# Patient Record
Sex: Male | Born: 1935 | Race: White | Hispanic: No | Marital: Married | Smoking: Never smoker
Health system: Southern US, Community
[De-identification: ages and names within clinical notes are randomized; demographics above are authoritative.]

## PROBLEM LIST (undated history)

## (undated) DIAGNOSIS — I350 Nonrheumatic aortic (valve) stenosis: Secondary | ICD-10-CM

## (undated) DIAGNOSIS — N183 Chronic kidney disease, stage 3 unspecified: Secondary | ICD-10-CM

## (undated) DIAGNOSIS — I10 Essential (primary) hypertension: Secondary | ICD-10-CM

## (undated) DIAGNOSIS — I219 Acute myocardial infarction, unspecified: Secondary | ICD-10-CM

## (undated) DIAGNOSIS — Z952 Presence of prosthetic heart valve: Secondary | ICD-10-CM

## (undated) DIAGNOSIS — M1990 Unspecified osteoarthritis, unspecified site: Secondary | ICD-10-CM

## (undated) DIAGNOSIS — E782 Mixed hyperlipidemia: Secondary | ICD-10-CM

## (undated) DIAGNOSIS — I480 Paroxysmal atrial fibrillation: Secondary | ICD-10-CM

## (undated) DIAGNOSIS — I2510 Atherosclerotic heart disease of native coronary artery without angina pectoris: Secondary | ICD-10-CM

## (undated) HISTORY — DX: Unspecified osteoarthritis, unspecified site: M19.90

## (undated) HISTORY — DX: Acute myocardial infarction, unspecified: I21.9

## (undated) HISTORY — PX: CORONARY ARTERY BYPASS GRAFT: SHX141

## (undated) HISTORY — DX: Chronic kidney disease, stage 3 unspecified: N18.30

## (undated) HISTORY — DX: Nonrheumatic aortic (valve) stenosis: I35.0

## (undated) HISTORY — PX: APPENDECTOMY: SHX54

## (undated) HISTORY — DX: Chronic kidney disease, stage 3 (moderate): N18.3

## (undated) HISTORY — DX: Essential (primary) hypertension: I10

## (undated) HISTORY — DX: Paroxysmal atrial fibrillation: I48.0

## (undated) HISTORY — PX: EYE SURGERY: SHX253

## (undated) HISTORY — DX: Atherosclerotic heart disease of native coronary artery without angina pectoris: I25.10

## (undated) HISTORY — PX: UMBILICAL HERNIA REPAIR: SHX196

## (undated) HISTORY — DX: Mixed hyperlipidemia: E78.2

---

## 2002-07-02 HISTORY — PX: OTHER SURGICAL HISTORY: SHX169

## 2003-05-07 ENCOUNTER — Inpatient Hospital Stay (HOSPITAL_COMMUNITY): Admission: EM | Admit: 2003-05-07 | Discharge: 2003-05-17 | Payer: Self-pay | Admitting: Cardiology

## 2004-07-24 ENCOUNTER — Ambulatory Visit: Payer: Self-pay | Admitting: Cardiology

## 2005-04-10 ENCOUNTER — Ambulatory Visit: Payer: Self-pay | Admitting: Cardiology

## 2005-04-12 ENCOUNTER — Ambulatory Visit: Payer: Self-pay | Admitting: Cardiology

## 2006-04-29 ENCOUNTER — Ambulatory Visit: Payer: Self-pay | Admitting: Cardiology

## 2007-05-08 ENCOUNTER — Ambulatory Visit: Payer: Self-pay | Admitting: Cardiology

## 2007-05-22 ENCOUNTER — Encounter: Payer: Self-pay | Admitting: Cardiology

## 2008-01-02 ENCOUNTER — Encounter: Payer: Self-pay | Admitting: Cardiology

## 2008-04-05 ENCOUNTER — Ambulatory Visit: Payer: Self-pay | Admitting: Cardiology

## 2009-03-01 DIAGNOSIS — E785 Hyperlipidemia, unspecified: Secondary | ICD-10-CM | POA: Insufficient documentation

## 2009-03-10 ENCOUNTER — Ambulatory Visit: Payer: Self-pay | Admitting: Cardiology

## 2009-03-10 DIAGNOSIS — R011 Cardiac murmur, unspecified: Secondary | ICD-10-CM | POA: Insufficient documentation

## 2009-03-10 DIAGNOSIS — N184 Chronic kidney disease, stage 4 (severe): Secondary | ICD-10-CM

## 2009-03-10 DIAGNOSIS — I2510 Atherosclerotic heart disease of native coronary artery without angina pectoris: Secondary | ICD-10-CM | POA: Insufficient documentation

## 2009-03-10 DIAGNOSIS — I10 Essential (primary) hypertension: Secondary | ICD-10-CM | POA: Insufficient documentation

## 2009-03-14 ENCOUNTER — Encounter: Payer: Self-pay | Admitting: Cardiology

## 2009-03-24 ENCOUNTER — Ambulatory Visit: Payer: Self-pay | Admitting: Cardiology

## 2009-03-25 ENCOUNTER — Encounter: Payer: Self-pay | Admitting: Cardiology

## 2009-04-21 ENCOUNTER — Ambulatory Visit: Payer: Self-pay | Admitting: Cardiology

## 2009-08-18 ENCOUNTER — Ambulatory Visit: Payer: Self-pay | Admitting: Cardiology

## 2009-08-18 DIAGNOSIS — I350 Nonrheumatic aortic (valve) stenosis: Secondary | ICD-10-CM

## 2009-08-22 ENCOUNTER — Encounter: Payer: Self-pay | Admitting: Cardiology

## 2009-08-25 ENCOUNTER — Encounter (INDEPENDENT_AMBULATORY_CARE_PROVIDER_SITE_OTHER): Payer: Self-pay | Admitting: *Deleted

## 2009-12-26 ENCOUNTER — Encounter: Payer: Self-pay | Admitting: Cardiology

## 2010-01-30 ENCOUNTER — Ambulatory Visit: Payer: Self-pay | Admitting: Cardiology

## 2010-07-06 ENCOUNTER — Encounter: Payer: Self-pay | Admitting: Cardiology

## 2010-07-27 ENCOUNTER — Encounter: Payer: Self-pay | Admitting: Cardiology

## 2010-08-01 NOTE — Assessment & Plan Note (Signed)
Summary: 6 mo fu per aug reminder-srs   Visit Type:  Follow-up Primary Provider:  Dr. Lia Hopping   History of Present Illness: 77 year old male presents for followup. He is here with his wife. He describes only occasional "sharp" chest pains that are brief period no progression in duration or intensity since I last saw him. Only occasional nitroglycerin glycerin use.  Followup labs from June showed BUN 30, creatinine 2.3, sodium 135, potassium 4.2. Creatinine had increased from 1.7 back in February. I discussed this with him, and he remains comfortable following with Dr. Olena Leatherwood. Nephrology consultation might be considered at some point.  Lipid profile from February showed total cholesterol 123, HDL 28, LDL 73, triglycerides 110. AST and ALT were normal. We reviewed these as well.  Patient states he had a colonoscopy done back in June, with removal of a polyp.   Preventive Screening-Counseling & Management  Alcohol-Tobacco     Smoking Status: never  Current Medications (verified): 1)  Fish Oil Double Strength 1200 Mg Caps (Omega-3 Fatty Acids) .... Take 1 Tablet By Mouth Three Times A Day 2)  Lisinopril 40 Mg Tabs (Lisinopril) .... Take One Tablet By Mouth Daily 3)  Aspirin 81 Mg Tbec (Aspirin) .... Take One Tablet By Mouth Daily 4)  Simvastatin 40 Mg Tabs (Simvastatin) .... Take One Tablet By Mouth Daily At Bedtime 5)  Tylenol 325 Mg Tabs (Acetaminophen) .... As Needed 6)  Pepcid 20 Mg Tabs (Famotidine) .... As Needed 7)  Metoprolol Tartrate 50 Mg Tabs (Metoprolol Tartrate) .... Take One Tablet By Mouth Twice A Day 8)  Isosorbide Mononitrate Cr 30 Mg Xr24h-Tab (Isosorbide Mononitrate) .... Take 1/2 Tablet By Mouth Daily  Allergies (verified): No Known Drug Allergies  Comments:  Nurse/Medical Assistant: The patient's medication list and allergies were reviewed with the patient and were updated in the Medication and Allergy Lists.  Past History:  Past Surgical  History: Last updated: 03/10/2009 Repair of umbilical hernia Appendectomy Knee Arthroplasty-Total (left) CABG - LIMA to LAD, SVG to OM, SVG to RCA, SVG to diagonal. November 2004.  Social History: Last updated: 03/10/2009 Tobacco Use - No.  Alcohol Use - no Married   Past Medical History: CAD - multivessel, LVEF 65% Hyperlipidemia Hypertension Myocardial Infarction Renal insufficiency Arthritis  Review of Systems       The patient complains of chest pain.  The patient denies anorexia, fever, syncope, dyspnea on exertion, peripheral edema, melena, and hematochezia.         Otherwise reviewed and negative except as outlined.  Vital Signs:  Patient profile:   77 year old male Height:      71 inches Weight:      190 pounds BMI:     26.60 Pulse rate:   49 / minute BP sitting:   143 / 78  (left arm) Cuff size:   regular  Vitals Entered By: Carlye Grippe (January 30, 2010 9:30 AM)  Nutrition Counseling: Patient's BMI is greater than 25 and therefore counseled on weight management options.   Physical Exam  Additional Exam:  Overweight male in no acute distress, somewhat hard of hearing. HEENT: Conjunctiva and lids normal, oropharynx clear. Neck: Supple, no elevated jugular venous pressure. No thyromegaly. Lungs: Clear without labored breathing. Cardiac: Regular rate and rhythm with 3/6 systolic murmur at the base. Radiates towards the carotids. No S3 gallop. Abdomen: Soft, nontender, no bruits. Bowel sounds present. Skin: Warm and dry. Extremities: No pitting edema. Distal pulses 2+. Musculoskeletal: No kyphosis. Neuropsychiatric: Alert oriented  x3. Affect normal.   Echocardiogram  Procedure date:  08/18/2009  Findings:      Mild to moderate LVH with LVEF greater than 65%, diastolic dysfunction, moderate left atrial enlargement, mild mitral regurgitation, mildly calcified aortic valve with mild aortic regurgitation and mild aortic stenosis, mean gradient 13 mm  mercury, mild tricuspid regurgitation, RVSP 41 mm mercury.  EKG  Procedure date:  01/30/2010  Findings:      Sinus bradycardia at 50 beats per minute.  Impression & Recommendations:  Problem # 1:  CORONARY ATHEROSCLEROSIS NATIVE CORONARY ARTERY (ICD-414.01)  Symptomatically stable on present medical regimen. Electrocardiogram is stable. Followup in 6 months, sooner if needed.  His updated medication list for this problem includes:    Lisinopril 40 Mg Tabs (Lisinopril) .Marland Kitchen... Take one tablet by mouth daily    Aspirin 81 Mg Tbec (Aspirin) .Marland Kitchen... Take one tablet by mouth daily    Metoprolol Tartrate 50 Mg Tabs (Metoprolol tartrate) .Marland Kitchen... Take one tablet by mouth twice a day    Isosorbide Mononitrate Cr 30 Mg Xr24h-tab (Isosorbide mononitrate) .Marland Kitchen... Take 1/2 tablet by mouth daily  Orders: EKG w/ Interpretation (93000)  Problem # 2:  AORTIC VALVE DISORDERS (ICD-424.1)  Mild aortic valve stenosis as noted by echocardiogram from February.  His updated medication list for this problem includes:    Lisinopril 40 Mg Tabs (Lisinopril) .Marland Kitchen... Take one tablet by mouth daily    Metoprolol Tartrate 50 Mg Tabs (Metoprolol tartrate) .Marland Kitchen... Take one tablet by mouth twice a day    Isosorbide Mononitrate Cr 30 Mg Xr24h-tab (Isosorbide mononitrate) .Marland Kitchen... Take 1/2 tablet by mouth daily  Problem # 3:  RENAL INSUFFICIENCY, CHRONIC (ICD-585.9)  Creatinine ranging from 1.7-2.3. Dr. Olena Leatherwood following at this point. I did discuss the possibility of nephrology referral at some time.  Problem # 4:  HYPERLIPIDEMIA-MIXED (ICD-272.4)  Well-controlled on present regimen. Followup fasting lipid profile and liver function tests around time of next visit.  His updated medication list for this problem includes:    Simvastatin 40 Mg Tabs (Simvastatin) .Marland Kitchen... Take one tablet by mouth daily at bedtime  Patient Instructions: 1)  Your physician wants you to follow-up in: 6 months. You will receive a reminder letter in  the mail one-two months in advance. If you don't receive a letter, please call our office to schedule the follow-up appointment. 2)  Your physician recommends that you go to the [facility] for a FASTING lipid profile and liver function labs:  IN 6 MONTHS BEFORE YOUR NEXT OFFICE VISIT.

## 2010-08-01 NOTE — Assessment & Plan Note (Signed)
Summary: 3 MONTH FU RECV LETTER VS   Visit Type:  Follow-up Primary Provider:  Dr. Lia Hopping   History of Present Illness: 77 year old male presents for follow-up. He reports no problems with progressive angina. He is having some "gas pain" in his lower chest, typically in the evenings, however not with exertion.  I reviewed his medications. He states that he has been taking all of his metoprolol tablets in the morning, rather than splitting them twice daily.  He has not had any followup labs with Dr. Olena Leatherwood, specifically relating to renal dysfunction and lipid status.  Preventive Screening-Counseling & Management  Alcohol-Tobacco     Smoking Status: never  Current Medications (verified): 1)  Fish Oil Double Strength 1200 Mg Caps (Omega-3 Fatty Acids) .... Take 1 Tablet By Mouth Three Times A Day 2)  Lisinopril 40 Mg Tabs (Lisinopril) .... Take One Tablet By Mouth Daily 3)  Aspirin 81 Mg Tbec (Aspirin) .... Take One Tablet By Mouth Daily 4)  Simvastatin 40 Mg Tabs (Simvastatin) .... Take One Tablet By Mouth Daily At Bedtime 5)  Tylenol 325 Mg Tabs (Acetaminophen) .... As Needed 6)  Pepcid 20 Mg Tabs (Famotidine) .... As Needed 7)  Metoprolol Tartrate 50 Mg Tabs (Metoprolol Tartrate) .... Take One Tablet By Mouth Twice A Day 8)  Isosorbide Mononitrate Cr 30 Mg Xr24h-Tab (Isosorbide Mononitrate) .... Take 1/2 Tablet By Mouth Daily  Allergies (verified): No Known Drug Allergies  Comments:  Nurse/Medical Assistant: The patient's medications and allergies were reviewed with the patient and were updated in the Medication and Allergy Lists. List reviewed.  Past History:  Past Medical History: Last updated: 03/10/2009 CAD Hyperlipidemia Hypertension Myocardial Infarction Renal insufficiency Arthritis  Past Surgical History: Last updated: 03/10/2009 Repair of umbilical hernia Appendectomy Knee Arthroplasty-Total (left) CABG - LIMA to LAD, SVG to OM, SVG to RCA, SVG to  diagonal. November 2004.  Social History: Last updated: 03/10/2009 Tobacco Use - No.  Alcohol Use - no Married    Review of Systems  The patient denies anorexia, fever, weight loss, hoarseness, syncope, peripheral edema, prolonged cough, headaches, melena, and hematochezia.         Otherwise reviewed and negative.  Vital Signs:  Patient profile:   77 year old male Height:      71 inches Weight:      193 pounds Pulse rate:   56 / minute BP sitting:   153 / 69  (left arm) Cuff size:   regular  Vitals Entered By: Carlye Grippe (August 18, 2009 2:15 PM)  Physical Exam  Additional Exam:  Overweight male in no acute distress, somewhat hard of hearing. HEENT: Conjunctiva and lids normal, oropharynx clear. Neck: Supple, no elevated jugular venous pressure. No thyromegaly. Lungs: Clear without labored breathing. Cardiac: Regular rate and rhythm with 3/6 systolic murmur at the base. Radiates towards the carotids. No S3 gallop. Abdomen: Soft, nontender, no bruits. Bowel sounds present. Skin: Warm and dry. Extremities: No pitting edema. Distal pulses 2+. Musculoskeletal: No kyphosis. Neuropsychiatric: Alert oriented x3. Affect normal.   Impression & Recommendations:  Problem # 1:  CORONARY ATHEROSCLEROSIS NATIVE CORONARY ARTERY (ICD-414.01)  Symptomatically stable on present medical regimen. I asked [name] to make sure that he takes his metoprolol on a twice-daily basis rather than all in the morning. Followup will be arranged in 6 months, sooner if he has progressive angina or breathlessness.  His updated medication list for this problem includes:    Lisinopril 40 Mg Tabs (Lisinopril) .Marland KitchenMarland KitchenMarland KitchenMarland Kitchen  Take one tablet by mouth daily    Aspirin 81 Mg Tbec (Aspirin) .Marland Kitchen... Take one tablet by mouth daily    Metoprolol Tartrate 50 Mg Tabs (Metoprolol tartrate) .Marland Kitchen... Take one tablet by mouth twice a day    Isosorbide Mononitrate Cr 30 Mg Xr24h-tab (Isosorbide mononitrate) .Marland Kitchen... Take 1/2  tablet by mouth daily  Problem # 2:  HYPERLIPIDEMIA-MIXED (ICD-272.4)  We plan followup lipid profile and liver function tests. He is tolerating simvastatin.  His updated medication list for this problem includes:    Simvastatin 40 Mg Tabs (Simvastatin) .Marland Kitchen... Take one tablet by mouth daily at bedtime  Orders: T-Basic Metabolic Panel [id number]) T-Hepatic Function [id number]) T-Lipid Profile [phone])  Problem # 3:  RENAL INSUFFICIENCY, CHRONIC (ICD-585.9)  Plan to obtain a followup BMET. Last creatinine was 2.1. [name] would be at risk for contrast nephropathy with a cardiac catheterization.  Orders: T-Basic Metabolic Panel [id number]) T-Hepatic Function [id number]) T-Lipid Profile [phone])  Problem # 4:  ESSENTIAL HYPERTENSION, BENIGN (ICD-401.1)  BP not optimally controlled today. Will continue with medication adjustments over her time.  His updated medication list for this problem includes:    Lisinopril 40 Mg Tabs (Lisinopril) .Marland Kitchen... Take one tablet by mouth daily    Aspirin 81 Mg Tbec (Aspirin) .Marland Kitchen... Take one tablet by mouth daily    Metoprolol Tartrate 50 Mg Tabs (Metoprolol tartrate) .Marland Kitchen... Take one tablet by mouth twice a day  Orders: T-Basic Metabolic Panel [id number]) T-Hepatic Function [phone]) T-Lipid Profile [id number])  Problem # 5:  AORTIC VALVE DISORDERS (ICD-424.1)  Mild aortic valve stenosis and regurgitation.  His updated medication list for this problem includes:    Lisinopril 40 Mg Tabs (Lisinopril) .Marland Kitchen... Take one tablet by mouth daily    Metoprolol Tartrate 50 Mg Tabs (Metoprolol tartrate) .Marland Kitchen... Take one tablet by mouth twice a day    Isosorbide Mononitrate Cr 30 Mg Xr24h-tab (Isosorbide mononitrate) .Marland Kitchen... Take 1/2 tablet by mouth daily  Patient Instructions: 1)  Labs - when convenient, nothing to eat or drink after midnight prior to labs 2)  Be sure to take Metoprolol two times a day  3)  Follow up in  6 months.

## 2010-08-01 NOTE — Letter (Signed)
Summary: Engineer, materials at [facility]  [address]   [place], [place] [address]   Phone: [phone]  Fax: [phone]        August 25, 2009 MRN: [id number]    Cortez Liuzzi [address] [place], [place]  [address]    Dear Mr. CAUL,  Your test ordered by Selena Batten has been reviewed by your physician (or physician assistant) and was found to be normal or stable. Your physician (or physician assistant) felt no changes were needed at this time.  ____ Echocardiogram  ____ Cardiac Stress Test  __X__ Lab Work-Per Dr. Diona Browner, Kidney function labs somewhat better (creatinine 1.7) and potassium normal.  LDL at goal and liver function labs OK.  Continue same medications for now.  ____ Peripheral vascular study of arms, legs or neck  ____ CT scan or X-ray  ____ Lung or Breathing test  ____ Other:   Thank you.   Cyril Loosen, RN, BSN    Duane Boston, M.D., F.A.C.C. Thressa Sheller, M.D., F.A.C.C. Oneal Grout, M.D., F.A.C.C. Cheree Ditto, M.D., F.A.C.C. Daiva Nakayama, M.D., F.A.C.C. Kenney Houseman, M.D., F.A.C.C. Jeanne Ivan, PA-C

## 2010-08-03 ENCOUNTER — Ambulatory Visit (INDEPENDENT_AMBULATORY_CARE_PROVIDER_SITE_OTHER): Payer: Medicare Other | Admitting: Cardiology

## 2010-08-03 ENCOUNTER — Ambulatory Visit: Admit: 2010-08-03 | Payer: Self-pay | Admitting: Cardiology

## 2010-08-03 ENCOUNTER — Encounter: Payer: Self-pay | Admitting: Cardiology

## 2010-08-03 DIAGNOSIS — E782 Mixed hyperlipidemia: Secondary | ICD-10-CM

## 2010-08-03 DIAGNOSIS — I359 Nonrheumatic aortic valve disorder, unspecified: Secondary | ICD-10-CM

## 2010-08-03 DIAGNOSIS — I2510 Atherosclerotic heart disease of native coronary artery without angina pectoris: Secondary | ICD-10-CM

## 2010-08-03 DIAGNOSIS — I10 Essential (primary) hypertension: Secondary | ICD-10-CM

## 2010-08-03 NOTE — Miscellaneous (Signed)
Summary: Orders Update  Clinical Lists Changes  Orders: Added new Test order of T-Lipid Profile ([id number]) - Signed Added new Test order of T-Hepatic Function ([id number]) - Signed 

## 2010-08-09 NOTE — Assessment & Plan Note (Signed)
Summary: 6 MO F/U PER FEB REMINDER- JM   Vital Signs:  Patient profile:   77 year old male Height:      71 inches Weight:      193 pounds Pulse rate:   49 / minute BP sitting:   147 / 70  (left arm) Cuff size:   regular  Vitals Entered By: Carlye Grippe (August 03, 2010 9:34 AM)  Visit Type:  Follow-up Primary Provider:  Dr. Lia Hopping   History of Present Illness: 77 year old male presents for followup. He was seen back in August 2011.  Reports no progressive angina or unusual shortness of breath. States he has to be careful not to "overdo it."  Followup labs from 26 January showed AST 18, ALT 17, cholesterol 129, triglycerides 111, HDL 40, LDL 67. We reviewed these today. He is tolerating simvastatin.  No reported lower extremity edema or claudication.  Followup ECG shows sinus bradycardia at 52 beats per minute. No reported dizziness or syncope.  Preventive Screening-Counseling & Management  Alcohol-Tobacco     Smoking Status: never  Clinical Review Panels:  Echocardiogram Echocardiogram Mild to moderate LVH with LVEF greater than 65%, diastolic dysfunction, moderate left atrial enlargement, mild mitral regurgitation, mildly calcified aortic valve with mild aortic regurgitation and mild aortic stenosis, mean gradient 13 mm mercury, mild tricuspid regurgitation, RVSP 41 mm mercury. (08/18/2009)   Current Medications (verified): 1)  Fish Oil Double Strength 1200 Mg Caps (Omega-3 Fatty Acids) .... Take 1 Tablet By Mouth Three Times A Day 2)  Lisinopril 40 Mg Tabs (Lisinopril) .... Take One Tablet By Mouth Daily 3)  Aspirin 81 Mg Tbec (Aspirin) .... Take One Tablet By Mouth Daily 4)  Simvastatin 40 Mg Tabs (Simvastatin) .... Take One Tablet By Mouth Daily At Bedtime 5)  Tylenol 325 Mg Tabs (Acetaminophen) .... As Needed 6)  Pepcid 20 Mg Tabs (Famotidine) .... As Needed 7)  Metoprolol Tartrate 50 Mg Tabs (Metoprolol Tartrate) .... Take One Tablet By Mouth Twice A  Day 8)  Isosorbide Mononitrate Cr 30 Mg Xr24h-Tab (Isosorbide Mononitrate) .... Take 1/2 Tablet By Mouth Daily  Allergies (verified): No Known Drug Allergies  Comments:  Nurse/Medical Assistant: The patient's medication list and allergies were reviewed with the patient and were updated in the Medication and Allergy Lists.  Past History:  Past Medical History: Last updated: 01/30/2010 CAD - multivessel, LVEF 65% Hyperlipidemia Hypertension Myocardial Infarction Renal insufficiency Arthritis  Past Surgical History: Last updated: 03/10/2009 Repair of umbilical hernia Appendectomy Knee Arthroplasty-Total (left) CABG - LIMA to LAD, SVG to OM, SVG to RCA, SVG to diagonal. November 2004.  Social History: Last updated: 03/10/2009 Tobacco Use - No.  Alcohol Use - no Married   Review of Systems       The patient complains of dyspnea on exertion.  The patient denies anorexia, fever, weight loss, chest pain, syncope, peripheral edema, prolonged cough, abdominal pain, melena, and hematochezia.         Otherwise reviewed and negative except as outlined.  Physical Exam  Additional Exam:  Overweight male in no acute distress, somewhat hard of hearing. HEENT: Conjunctiva and lids normal, oropharynx clear. Neck: Supple, no elevated jugular venous pressure. No thyromegaly. Lungs: Clear without labored breathing. Cardiac: Regular rate and rhythm with 3/6 systolic murmur at the base. Radiates towards the carotids. No S3 gallop. Abdomen: Soft, nontender, no bruits. Bowel sounds present. Skin: Warm and dry. Extremities: No pitting edema. Distal pulses 2+. Musculoskeletal: No kyphosis. Neuropsychiatric: Alert oriented x3. Affect  normal.   Impression & Recommendations:  Problem # 1:  CORONARY ATHEROSCLEROSIS NATIVE CORONARY ARTERY (ICD-414.01)  Symptomatically stable on medical therapy. ECG reviewed above. No changes made today. Encouraged regular walking regimen. Otherwise I will  see him back in 6 months.  His updated medication list for this problem includes:    Lisinopril 40 Mg Tabs (Lisinopril) .Marland Kitchen... Take one tablet by mouth daily    Aspirin 81 Mg Tbec (Aspirin) .Marland Kitchen... Take one tablet by mouth daily    Metoprolol Tartrate 50 Mg Tabs (Metoprolol tartrate) .Marland Kitchen... Take one tablet by mouth twice a day    Isosorbide Mononitrate Cr 30 Mg Xr24h-tab (Isosorbide mononitrate) .Marland Kitchen... Take 1/2 tablet by mouth daily  Orders: EKG w/ Interpretation (93000)  Problem # 2:  AORTIC VALVE DISORDERS (ICD-424.1)  Cardiac murmur with mild aortic valve stenosis.  His updated medication list for this problem includes:    Lisinopril 40 Mg Tabs (Lisinopril) .Marland Kitchen... Take one tablet by mouth daily    Metoprolol Tartrate 50 Mg Tabs (Metoprolol tartrate) .Marland Kitchen... Take one tablet by mouth twice a day    Isosorbide Mononitrate Cr 30 Mg Xr24h-tab (Isosorbide mononitrate) .Marland Kitchen... Take 1/2 tablet by mouth daily  Orders: EKG w/ Interpretation (93000)  Problem # 3:  ESSENTIAL HYPERTENSION, BENIGN (ICD-401.1)  Blood pressure elevated today. We discussed sodium restriction, also regular walking and diet.  His updated medication list for this problem includes:    Lisinopril 40 Mg Tabs (Lisinopril) .Marland Kitchen... Take one tablet by mouth daily    Aspirin 81 Mg Tbec (Aspirin) .Marland Kitchen... Take one tablet by mouth daily    Metoprolol Tartrate 50 Mg Tabs (Metoprolol tartrate) .Marland Kitchen... Take one tablet by mouth twice a day  Problem # 4:  HYPERLIPIDEMIA-MIXED (ICD-272.4)  LDL well controlled, liver function tests normal.  His updated medication list for this problem includes:    Simvastatin 40 Mg Tabs (Simvastatin) .Marland Kitchen... Take one tablet by mouth daily at bedtime  Problem # 5:  RENAL INSUFFICIENCY, CHRONIC (ICD-585.9)  Has been followed by Dr. Olena Leatherwood. Followup BMET for next visit.  Patient Instructions: 1)  Your physician wants you to follow-up in: 6 months. You will receive a reminder letter in the mail one-two months  in advance. If you don't receive a letter, please call our office to schedule the follow-up appointment. 2)  Your physician recommends that you go to the [facility] for lab work: (FLP/LFT/BMET) DO LABS BEFORE OFFICE VISIT IN 6 MONTHS. 3)  Your physician recommends that you continue on your current medications as directed. Please refer to the Current Medication list given to you today.   Orders Added: 1)  EKG w/ Interpretation [93000]

## 2010-11-14 NOTE — Assessment & Plan Note (Signed)
[facility]                          [facility] OFFICE NOTE   [name], [name]                      MRN:          [id number]  DATE:05/08/2007                            DOB:          04-15-1929    REASON FOR VISIT:  Cardiac followup.   HISTORY OF PRESENT ILLNESS:  [name] comes back in for annual visit.  He last saw Mr. Shara Blazing back in October 2007.  He has a history of  coronary artery disease, status post 4 vessel coronary artery bypass  grafting in November 2004 as detailed previously with a followup low-  risk/abnormal Cardiolite in 2006.  His electrocardiogram today shows  sinus bradycardia with left atrial enlargement but otherwise no acute  changes.  I note that his blood pressure is elevated today.  His LDL was  recently assessed at 85 and he has overall normal liver function tests.  I reviewed his medications today.  Symptomatically he denies any  exertional chest pain.  He does state that he experiences a burning  chest pain typically in the morning when he wakes up that seems to get  better as the day goes on although he does experience symptoms like this  sometimes in the day hours and does report occasional indigestion.  He  bought some Pepcid over the counter recently and has just started to  take this, perhaps once or twice a week.   ALLERGIES:  NO KNOWN DRUG ALLERGIES.   PRESENT MEDICATIONS:  1. Aspirin 325 mg p.o. daily.  2. Simvastatin 20 mg p.o. nightly.  3. Omega 3 supplements 1200 mg p.o. daily.  4. Lopressor 25 mg p.o. b.i.d.  5. Lisinopril 10 mg p.o. daily.  6. Tylenol p.r.n.   REVIEW OF SYSTEMS:  As described in the history of present illness.   EXAMINATION:  Blood pressure is 185/82, confirmed by me, heart rate 50,  weight is 190 pounds, patient is comfortable, in no acute distress.  HEENT:  Conjunctivae and lids normal, oropharynx is clear.  NECK:  Supple without elevated jugular venous pressure or loud  bruits,  no thyromegaly is noted.  LUNGS:  Clear without labored breathing.  CARDIAC:  Reveals a regular rate and rhythm, no loud murmur or gallop.  ABDOMEN:  Soft, nontender, normoactive bowel sounds.  EXTREMITIES:  Showed no significant edema.  Distal pulses are 2+.  SKIN:  Warm and dry.  MUSCULOSKELETAL:  No kyphosis is noted.  NEUROPSYCHIATRIC:  Patient alert and oriented x3.   IMPRESSION AND RECOMMENDATIONS:  1. Hypertension, not optimally controlled.  I have asked [name]      to increase his lisinopril to 20 mg daily and provided him with      prescription.  He will have a followup BMET in 2 weeks to ensure      stable renal function.  2. Multivessel coronary artery disease, status post coronary artery      bypass grafting.  Patient is not reporting any clear angina and had      a relatively low risk Cardiolite in 2006.  Will plan to continue  medical therapy.  I suspect that some of his chest burning in the      mornings may be reflux and I have asked him to start taking his      Pepcid in the evening on a regular basis.  He may in fact need a      regular proton pump inhibitor for this.  He will let us know if his      symptoms improve or not.  3. Otherwise routine followup over the next 6 months.     Jonelle Sidle, MD  Electronically Signed    SGM/MedQ  DD: 05/08/2007  DT: 05/08/2007  Job #: [id number]

## 2010-11-14 NOTE — Assessment & Plan Note (Signed)
[facility]                          [facility] OFFICE NOTE   [name], [name]                      MRN:          [id number]  DATE:04/05/2008                            DOB:          1928-10-27    PRIMARY CARE PHYSICIAN:  Angie Fava, MD   REASON FOR VISIT:  Routine cardiac followup.   HISTORY OF PRESENT ILLNESS:  [name] was last seen back in November  2008.  He denies having any significant angina at this time and reports  NYHA class II dyspnea on exertion.  His electrocardiogram today shows  sinus bradycardia at 51 beats per minute with no other major changes  compared to his previous tracing.  He brought a copy of lab work  obtained back in July demonstrating an increase in his LDL cholesterol  from 85 up to 100, on the same dose of simvastatin.  His HDL was 26 and  his liver function tests were otherwise normal.  BUN and creatinine were  relatively stable at 36 and 1.6.  I reviewed his medications today and  we talked about increasing his dose of simvastatin.  He seems to be  tolerating the increase in lisinopril we made last year, although blood  pressure still needs regular follow-up as remains elevated.  He seems to  be doing well with activities of daily living.  He is not reporting any  major indigestion symptoms at this point.   ALLERGIES:  No known drug allergies.   MEDICATIONS:  1. Aspirin 81 mg p.o. daily.  2. Simvastatin 20 mg p.o. nightly.  3. Lisinopril 20 mg p.o. daily.  4. Lopressor 25 mg p.o. b.i.d.  5. Omega 3 supplements 1200 mg p.o. daily.   REVIEW OF SYSTEMS:  As described in the history of present illness.  Otherwise negative.   PHYSICAL EXAMINATION:  VITAL SIGNS:  Blood pressure is 150/81, heart  rate is 53, weight is 189 pounds, which is relatively stable.  GENERAL:  The patient is comfortable and in no acute distress.  HEENT:  Conjunctiva is normal.  Oropharynx is clear.  NECK:  Supple.  No  elevated jugular venous pressure.  No loud bruits.  No thyromegaly is noted.  LUNGS:  Clear without labored breathing at rest.  CARDIAC:  Regular rate and rhythm, 2/6 systolic murmur heard at the  right base.  Second heart sound is preserved.  No S3 gallop.  ABDOMEN:  Soft, nontender.  Normoactive bowel sounds.  EXTREMITIES:  Trace edema, left worse than right.  Distal pulses are 2+.  SKIN:  Warm and dry.  MUSCULOSKELETAL:  No kyphosis noted.  NEUROPSYCHIATRIC:  The patient is alert and oriented x3.  Affect is  appropriate.   IMPRESSION AND RECOMMENDATIONS:  1. Cardiovascular disease, status post coronary artery bypass grafting      in November 2004 with a low-risk followup Cardiolite in 2006.  He      is not reporting any major progression in symptomatology and at      this point will continue medical therapy.  I will plan to see him  back in 1 year's time.  We will likely consider a followup      Cardiolite at that point for surveillance, sooner if he develops      any progressive symptoms.  2. Hyperlipidemia with increasing LDL cholesterol.  We will increase      simvastatin to 40 mg daily and followup with lipid profile and      liver function tests in 12 weeks.  3. Hypertension, better, but not yet optimally controlled.  He will      continue to follow with Dr. Eveline Keto.  May need additional      increase in lisinopril over time.  He has had relatively stable      renal insufficiency.     Jonelle Sidle, MD  Electronically Signed    SGM/MedQ  DD: 04/05/2008  DT: 04/06/2008  Job #: [id number]   cc:   Angie Fava, MD

## 2010-11-17 NOTE — Cardiovascular Report (Signed)
NAME:  [name], [name] NO.:  [id number]   MEDICAL RECORD NO.:  [id number]                   PATIENT TYPE:  INP   LOCATION:  2922                                 FACILITY:  [facility]   PHYSICIAN:  Carole Binning, M.D. [facility]         DATE OF BIRTH:  June 13, 1929   DATE OF PROCEDURE:  05/07/2003  DATE OF DISCHARGE:                              CARDIAC CATHETERIZATION   PROCEDURE PERFORMED:  Left heart catheterization with coronary angiography  and left ventriculography.   INDICATION:  Mr. Fridman is a 77 year old male admitted to [facility]  today with chest pain and ruled in for a non-Q-wave myocardial infarction.  He was transferred urgently to [facility] for cardiac  catheterization.   PROCEDURAL NOTE:  A 6 French sheath was placed in the right femoral artery.  Coronary angiography was performed with 6 [place] JL-4 and JR-4 catheters.  Left ventriculography was performed with an angled pigtail catheter.  Contrast was Omnipaque.  At the conclusion of the procedure, an Angio-Seal  vascular closure device was placed in the right femoral artery.  There  initially appeared to be good hemostasis, but the patient did develop  moderate hematoma and therefore manual pressure was applied and good  hemostasis was achieved.  There were no complications.   RESULTS:   HEMODYNAMICS:  1. Left ventricular pressure 120/12.  2. Aortic pressure 114/66.  3. There is no aortic valve gradient.   LEFT VENTRICULOGRAM:  Wall motion is normal.  Ejection fraction estimated at  greater than or equal to 60%.  There is no mitral regurgitation.   CORONARY ARTERIOGRAPHY (RIGHT DOMINANT):  Left main is normal.   Left anterior descending artery has a tubular 95% stenosis extending across  the origin of the large second diagonal branch.  The second diagonal branch  itself has an 80% stenosis at its origin.  The LAD also gives rise to a  large first diagonal which  arises from the very proximal LAD.   Left circumflex gives rise to a small first marginal branch and a large  second marginal branch.  There is significant tortuosity of the mid  circumflex.  There is a tubular 80% stenosis in the mid to distal circumflex  extending into the origin of the large second obtuse marginal branch.   Right coronary artery is a dominant vessel.  There is a diffuse area of  plaque beginning from the proximal and extending into the mid vessel which  at its worse is approximately 75% stenosis.  The distal right coronary  artery gives rise to a large posterior descending artery, small first and  second posterior lateral branches and a normal size third posterior lateral  branch.   IMPRESSION:  1. Normal left ventricular systolic function.  2. Complex three-vessel coronary artery disease.  The culprit lesion for the     patient's non-Q-wave myocardial infarction is the complex left anterior  descending diagonal bifurcational disease.   PLAN:  The patient will be referred for coronary bypass surgery.                                               Carole Binning, M.D. [facility]    MWP/MEDQ  D:  05/07/2003  T:  05/08/2003  Job:  [id number]   cc:   Annette Stable Hasanaj  [address]  [place]  [place] [address]  Fax: [phone]   [facility] in [place]

## 2010-11-17 NOTE — Consult Note (Signed)
NAME:  [name], [name] NO.:  [id number]   MEDICAL RECORD NO.:  [id number]                   PATIENT TYPE:  INP   LOCATION:  2922                                 FACILITY:  [facility]   PHYSICIAN:  Salvatore Decent. Cornelius Moras, M.D.              DATE OF BIRTH:  1928-12-23   DATE OF CONSULTATION:  05/08/2003  DATE OF DISCHARGE:                                   CONSULTATION   REQUESTING PHYSICIAN:  Carole Binning, M.D.   PRIMARY CARDIOLOGIST:  Jonelle Sidle, M.D.   PRIMARY CARE PHYSICIAN:  Omer Jack., M.D., in [place],  [place].   REASON FOR CONSULTATION:  Severe three-vessel coronary artery disease,  status post acute non-Q-wave myocardial infarction.   HISTORY OF PRESENT ILLNESS:  The patient is a 77 year old white male with no  previous cardiac history and risk factors notable only for a history of  hypertension.  The patient has remained fairly active physically and without  significant medical problems.  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  He initially recovered from the surgery entirely uneventfully.  Approximately one week ago, he had new onset episode of burning substernal  chest pain associated with shortness of breath.  The pain was transient and  subsided without event.  Early yesterday morning, he awoke from sleep with a  similar episode of severe burning substernal chest pain occurring at rest  and associated with shortness of breath.  This pain persistent, ultimately  prompting him to present to Dr. Annamaria Boots office in [place],  [place].  By the time he was evaluated, his pain had resolved.  An  electrocardiogram performed then did not show any acute ST-T wave changes.  The patient was advised to be admitted to the hospital.  He subsequently was  admitted to [facility] [place], under the care of Dr.  Olena Leatherwood.  There  his initial set of cardiac enzymes were positive with a  troponin I of 3.59 and a CK-MB fraction of 25.3 with total CPK 185.  He had  another prolonged episode of substernal chest pain at that time and this was  relieved using intravenous heparin and nitroglycerin.  He was evaluated by  Dr. Diona Browner and subsequently transferred to [facility] for further  treatment.  He was taken for cardiac catheterization yesterday evening by  Dr. Gerri Spore.  This demonstrated severe three-vessel coronary artery  disease with normal left ventricular function and coronary anatomy not  favorable for percutaneous coronary intervention.  Elective cardiac surgical  consultation has been requested.  Mr. Formisano has remained entirely stable  since transfer to [facility] and denies any further episodes of chest pain.   REVIEW OF SYSTEMS:  GENERAL:  The patient has been otherwise well.  He  reports good appetite and has not been gaining or losing  weight.  CARDIAC:  The patient denies any preceding symptoms of chest pain nor exertional  shortness of breath suggestive of angina other than that described  previously.  The patient has not had physical limitations.  He denies any  episodes of resting shortness of breath, PND, orthopnea, or lower extremity  edema.  He has not had any palpitations or syncope.  RESPIRATORY:  Negative.  The patient denies problems with productive cough, hemoptysis, wheezing, or  significant shortness of breath.  GASTROINTESTINAL:  Negative.  The patient  reports no difficulty swallowing.  He has normal bowel function.  He reports  no history of  hematochezia, hematemesis, or melena.  MUSCULOSKELETAL:  Notable for recent total knee replacement.  He has recovered remarkably well  from this and is already up and ambulating.  He undergoes physical therapy  three days a week.  He denies any other problems with significant aches or  pains other than mild arthralgias in both hands.   NEUROLOGIC:  Negative.  The patient denies symptoms suggestive of previous TIA or stroke.  PERIPHERAL VASCULAR:  Negative.  The patient denies symptoms suggestive of  claudication.  He has not had any swelling in his left lower leg following  knee surgery.  He denies problems with venous insufficiency or previous  blood clot.  GENITOURINARY:  Negative.  INFECTIOUS:  Negative.  ENDOCRINE:  Negative.  PSYCHIATRIC:  Negative.  HEAD, EYES, EARS, NOSE, AND THROAT:  Negative.  The patient wears glasses with stable eyesight.  He has no loose  teeth or painful teeth and he sees his dentist regularly.   PAST MEDICAL HISTORY:  Notable for a history of hypertension.  His lipid  status is unknown.  He denies any previous history of coronary artery  disease, myocardial infarction, congestive heart failure, diabetes mellitus,  and previous stroke.   PAST SURGICAL HISTORY:  Notable for a left total knee replacement one month  ago, left knee arthroscopy in 1992, repair of umbilical hernia with  appendectomy in the distant past.   FAMILY HISTORY:  Notable for the absence of early onset coronary artery  disease.   SOCIAL HISTORY:  The patient is married and lives with his wife in [place], [place].  They have one daughter who lives in [place], [place],  and is on the board of directors for [employer].  They have one  grandchild.  He is a nonsmoker and denies any history of significant alcohol  consumption.   MEDICATIONS PRIOR TO ADMISSION:  Teveten 300 mg once daily.   ALLERGIES:  The patient denies any known drug allergies or sensitivities.   PHYSICAL EXAMINATION:  GENERAL APPEARANCE:  Notable for a well-appearing  white male who appears his stated age in no acute distress.  VITAL SIGNS:  He has been afebrile and normotensive in normal sinus rhythm  since hospital admission.  HEENT:  Essentially unrevealing. NECK:  Supple.  There is no cervical or supraclavicular  lymphadenopathy.  There is no jugular venous distention.  There are no carotid bruits.  CHEST:  Auscultation of the chest demonstrates clear and symmetrical breath  sounds bilaterally.  No wheezes or rhonchi are appreciated.  ABDOMEN:  Soft and nontender.  There are no palpable masses.  CARDIOVASCULAR:  Regular rate and rhythm.  No murmurs, rubs, or gallops are  noted.  EXTREMITIES:  Warm and well perfused.  There is a surgical incision on the  left knee on the anterior surface which appears to be healing well.  There  is no lower extremity edema in either lower leg.  Distal pulses are easily  palpable in both lower legs at the ankle.  There is no sign of venous  insufficiency.  SKIN:  Clean, dry, and healthy appearing throughout.  RECTAL:  Deferred.  GENITOURINARY:  Deferred.  NEUROLOGIC:  Grossly nonfocal.   LABORATORY DATA:  Baseline hemoglobin and hematocrit are mildly low,  consistent with iron deficiency anemia with hemoglobin 11.9 and hematocrit  33.9% obtained at [facility] prior to transfer.  The patient's other  laboratory data are entirely within normal limits with the exception of  cardiac enzymes as described previously.  A 12-lead electrocardiogram  reveals normal sinus rhythm with nonspecific T wave changes in the  anterolateral leads.   DIAGNOSTIC TESTS:  Cardiac catheterization performed by Dr. Gerri Spore has  been reviewed.  This demonstrates severe three-vessel coronary artery  disease with normal left ventricular function.  Specifically, there is 90-  95% stenosis of the mid left anterior descending coronary artery arising at  the takeoff of the large diagonal branch and involving the ostial portion of  this large diagonal branch.  There is a large ramus intermedius branch which  appears to be free of disease.  There is 80% proximal stenosis of a large  first circumflex marginal branch which begins at the ostial portion of this  vessel.  There is 75%  stenosis of the mid right coronary artery with right  dominant coronary circulation.  Left ventricular function is normal with no  significant wall motion abnormalities.  An ejection fraction estimated  greater than 60%.   IMPRESSION:  Severe three-vessel coronary artery disease with coronary  anatomy not favorable for percutaneous coronary intervention, status post  acute non-Q-wave myocardial infarction with normal left ventricular  function.  I believe that Mr. Lamartina would best be treated by elective  coronary artery bypass grafting.   PLAN:  I have outlined the options at length with Mr. Rayburn and his  family.  Alternative treatment strategies have been discussed.  They  understand and accept all associated risks of surgery, including, but not  limited to risks of death, stroke, myocardial infarction, congestive heart  failure, pneumonia, bleeding requiring blood transfusion, arrhythmia, infection, and recurrent coronary artery disease.  We tentatively plan to  proceed with surgery, the first case on Monday, May 10, 2003, with  Salvatore Decent. Dorris Fetch, M.D.  All other questions have been addressed.                                               Salvatore Decent. Cornelius Moras, M.D.    CHO/MEDQ  D:  05/08/2003  T:  05/08/2003  Job:  [id number]   cc:   Jonelle Sidle, M.D. [facility]   Xaje Hasanaj  [address]  [place]  [place] [address]  Fax: [phone]   Loel Ro, Montez Hageman., M.D.  [place], [place]

## 2010-11-17 NOTE — Op Note (Signed)
NAME:  [name], [name]                         ACCOUNT NO.:  [id number]   MEDICAL RECORD NO.:  [id number]                   PATIENT TYPE:  INP   LOCATION:  2302                                 FACILITY:  [facility]   PHYSICIAN:  Salvatore Decent. Dorris Fetch, M.D.         DATE OF BIRTH:  [birth year]   DATE OF PROCEDURE:  05/10/2003  DATE OF DISCHARGE:                                 OPERATIVE REPORT   PREOPERATIVE DIAGNOSIS:  Three vessel coronary artery disease, status post  myocardial infarction.   POSTOPERATIVE DIAGNOSIS:  Three vessel coronary artery disease, status post  myocardial infarction.   PROCEDURE:  Median sternotomy, extracorporeal circulation, coronary artery  bypass grafting x4 (left internal mammary artery to LAD, saphenous vein  graft to second diagonal, saphenous vein graft to obtuse marginal 1,  saphenous vein graft to distal right coronary artery), endoscopic vein  harvest left thigh.   SURGEON:  Salvatore Decent. Dorris Fetch, M.D.   ASSISTANTSalvatore Decent. Cornelius Moras, M.D.   SECOND ASSISTANT:  Rowe Clack, P.A.-C.   ANESTHESIA:  General.   FINDINGS:  Good quality conduits.  Good quality targets.  Good left  ventricular function.   INDICATIONS FOR PROCEDURE:  The patient is a 77 year old gentleman with a  history of hypertension, but no previous cardiac history, who presented with  a one-week history of crescendo anginal symptoms.  He ruled in for a non-Q  wave MI and at catheterization had severe three vessel disease including  complex LAD disease at the takeoff of the second diagonal.  He was not a  candidate for percutaneous intervention and was referred for coronary artery  bypass grafting.  He was seen in consultation by Salvatore Decent. Cornelius Moras, M.D.,  evaluated, and felt to be an appropriate candidate for coronary artery  bypass grafting.  The indications, risks, benefits, and alternative  treatments were discussed in detail with the patient.  He understood and  accepted the  risks of bypass surgery and agreed to proceed.   I met with the patient on the morning of surgery, May 10, 2003, reviewed  the findings, agreed with Dr. Orvan July assessment.  The patient understands  the operation and the risks and benefits and agrees to proceed.   DESCRIPTION OF PROCEDURE:  The patient was brought to the preoperative  holding area on May 10, 2003, and lines were placed to monitor arterial,  central venous, and pulmonary arterial pressure.  Intravenous antibiotics  were administered.  The patient was taken to the operating room,  anesthetized, and intubated.  A Foley catheter was placed.  The chest,  abdomen, and legs were prepped and draped in the usual sterile fashion.   The patient had a large hematoma in the right groin from catheterization,  therefore, the decision was made to proceed with vein harvest from the left  leg.  The vein was identified at the level of the knee and the greater  saphenous vein was harvested  from the thigh endoscopically.  It was a good  quality vein.  Additional segment was harvested with a bridged open incision  from the upper calf.  Simultaneously while the vein was being harvested, a  median sternotomy was performed. The left internal mammary artery was  harvested using standard technique.  The patient was fully heparinized prior  to dividing the distal end of the mammary artery. There was excellent flow  through the cut end of the vessel.   The pericardium was opened.  The ascending aorta was inspected and palpated.  There was no palpable atherosclerotic disease.  The aorta was cannulated via  concentric 2-0 Ethibond pledgeted pursestring sutures.  A dual stage venous  cannula was placed via pursestring suture in the right atrial appendage.  Cardiopulmonary bypass was instituted and the patient was cooled to 32  degrees Celsius.  Coronary arteries were inspected and anastomotic sites  were chosen.  The conduits were inspected and  cut to length. A foam pad was  placed in the pericardium to protect the left phrenic nerve. A temperature  probe was placed in the myocardial septum and a cardioplegia cannula was  placed in the ascending aorta.   The aorta was crossclamped, the left ventricle was emptied via the aortic  root vent. Cardiac arrest then was achieved with a combination of cold  antegrade blood cardioplegia and topical ice saline.  After achieving a  complete diastolic arrest and myocardial septal temperature of 12 degrees  Celsius, the following distal anastomoses were performed.   First a reversed saphenous vein graft was placed end-to-side to the distal  right coronary artery.  The right coronary artery was a 2 mm good quality  target and had a 70 to 80% stenosis proximally.  The saphenous vein was of  good quality.  The anastomosis was performed end-to-side with a running 7-0  Prolene suture.  At the completion of the anastomosis, there was excellent  flow through the graft.  Cardioplegia was administered down the vein graft.  There was good hemostasis at the anastomosis.   Next, a reversed saphenous vein graft was placed end-to-side to the first  obtuse marginal branch of the left circumflex coronary artery.  This also  had approximately a 70% stenosis.  The anastomosis was performed with a  running 7-0 Prolene suture.  Again the vein and the target were good quality  vessels.  At the completion of the anastomosis, again there was excellent  flow through this graft.  Cardioplegia was administered and there was good  hemostasis.   Next, a reversed saphenous vein graft was placed end-to-side to the second  diagonal branch of the LAD.  This vessel was compromised by the LAD stenosis  as well as an ostial lesion in the diagonal itself.  It was a 1.5 mm good  quality target. The vein was of good quality and an end-to-side anastomosis was performed with a running 7-0 Prolene suture and again there was  good  flow through this graft and good hemostasis for cardioplegia administration.   Additional cardioplegia was also administered down the aortic root.  The  left internal mammary artery was brought through a window in the pericardium  and the distal end was spatulated and prepared for grafting.  The vein was  anastomosed end-to-side to the distal LAD.  The distal LAD was a 1.5 mm  tortuous, but good quality vessel.  It was intramyocardial proximal to the  site of the anastomosis and epicardial distal to  the site of the  anastomosis.  The anastomosis was performed with a running 8-0 Prolene  suture in an end-to-side fashion.  At completion of the anastomosis, the  Bulldog clamp was briefly removed and inspected for hemostasis which was  excellent.  The Bulldog clamp was replaced.  Immediate and rapid septal  rewarming was noted while the Bulldog clamp was removed.  The mammary  pedicle was tacked to the epicardial surface of the heart with 6-0 Prolene  sutures and additional cardioplegia was administered down the vein grafts  and the aortic root.   The vein grafts were cut to length.  The cardioplegia cannula was removed  from the ascending aorta and the proximal vein graft anastomoses were  performed to 4.4 mm punch aortotomies while under crossclamp.  At the  completion of the final proximal anastomosis, the patient was placed in  Trendelenburg position.  The Bulldog clamp was again removed from the left  mammary artery.  Immediate and rapid septal rewarming was noted.  The aortic  root was deaired.  After completely deairing the aortic root, the crossclamp  was removed.  Total crossclamp time was 70 minutes.  Residual air was  aspirated from the vein grafts and the Bulldog clamp was removed from them  as well.   While the patient was being rewarmed, all proximal and distal anastomoses  were inspected for hemostasis.  The patient did require two defibrillations  with 20 joules.  He  remained in sinus rhythm thereafter.  Epicardial pacing  wires were placed on the right ventricle and right atrium.  The patient had  been rewarmed to a core temperature of 37 degrees Celsius, he was weaned  from cardiopulmonary bypass without difficulty.  He was in sinus rhythm and  on inotropic support at the time of separation from bypass.  Total bypass  time was 121 minutes.  The initial cardiac index was greater than 2 liters  per minute per meter squared and the patient remained hemodynamically stable  throughout the post bypass period.   Test dose of Protamine was administered and was well tolerated.  The atrial  and aortic cannuli were removed.  There was good hemostasis at both  cannulation sites.  The remainder of the Protamine was administered without  incident.  The chest was irrigated with 1 liter of warm normal saline  containing 1 gram of Vancomycin.  Hemostasis was achieved.  The pericardium was reapproximated with interrupted 3-0 silk sutures and came together  easily without tension or kinking of the underlying grafts.  The left  pleural and two mediastinal chest tubes were placed through separate  subcostal incisions.  The sternum was closed with heavy gauge interrupted  stainless steel wires.  The pectoralis fascia was closed with running #1  Vicryl suture.  The subcutaneous tissue and skin were closed in a standard  fashion with subcuticular skin closures in both the chest and the leg.  All  needle, sponge, and instrument counts correct at the end of the procedure.  The patient tolerated the procedure well and was taken from the operating  room to the surgical intensive care unit in critical, but stable condition.                                               Salvatore Decent Dorris Fetch, M.D.    SCH/MEDQ  D:  05/10/2003  T:  05/10/2003  Job:  [id number]   cc:   Jonelle Sidle, M.D. [facility]   Dr. Mariane Duval, [place], [place]   Dr. Tresa Moore, [place], [place]

## 2010-11-17 NOTE — Assessment & Plan Note (Signed)
[facility]                            [facility] OFFICE NOTE   Craig, Hayes                      MRN:          [id number]  DATE:04/29/2006                            DOB:          May 29, 1929    PRIMARY CARDIOLOGIST:  Dr. Simona Huh.   REASON FOR OFFICE VISIT:  Schedule his annual followup.   [name] is a 77 year old male with known coronary artery disease status  post four vessel coronary artery bypass grafting in November 2004. Last seen  here in the clinic in October of last year. Following that visit he was  referred for an exercise stress Cardiolite for evaluation of atypical chest  pain. This was felt to be a low risk study notable for a predominantly fixed  mild inferior defect with small degree of apical reversibility; EF 60%.   Since then the patient reports symptoms that are suggestive of chronic  stable angina pectoris. He describes a midsternal burning which is  precipitated by walking and relieved by rest. He states that he has had this  ever since his bypass surgery, and he denies any exacerbation in terms of  either increased severity or frequency since his last clinic visit. He  denies any symptoms at rest. Although he has reflux symptoms, he states that  these are dissimilar.   Electrocardiogram today reveals NSR at 60 BPM with normal access and no  ischemic changes.   CURRENT MEDICATIONS:  1. Aspirin 325 daily.  2. Zocor 20 daily.  3. Lopressor 12.5 b.i.d.  4. Foltx.   PHYSICAL EXAMINATION:  VITAL SIGNS:  Blood pressure 172/82, pulse 72,  regular weight 193.  GENERAL:  A 77 year old male sitting upright in no apparent distress.  NECK:  Palpable carotid pulses without bruits.  LUNGS:  Clear to auscultation in all fields.  HEART:  Regular rate and rhythm (S1, S2). No significant murmurs.  ABDOMEN:  Soft, nontender with intact bowel sounds.  EXTREMITIES:  Palpable distal pulses without edema.   IMPRESSION:  1. Chronic stable angina pectoris.      a.     Low risk exercise stress Cardiolite October 2006.  2. Coronary artery disease.      a.     Non ST elevation myocardial infarction/four vessel CABG November       2004:  LIMA - LAD; SVG - second diagonal; SVG - OM1; SVG - RCA.      b.     Preserved left ventricular function.  3. Hypertension now controlled.  4. Hyperlipidemia.      a.     LDL 95 by lipid profile 2006.  5. Mild renal insufficiency.  6. Creatinine 1.4 in May 2006.   PLAN:  1. Add Imdur 30 daily to see if this helps suppress these symptoms of      exertional chest burning which are suggestive of angina. The patient      will also be provided with a prescription for nitroglycerin.  2. Change Zocor to Crestor 10 daily. The patient was given samples of this      when last seen here  in the clinic, and he apparently was tolerating      these well. However, he was not continued on the Crestor. Given his      elevated LDL of a year ago, we will therefore change him over to      Crestor and check a followup __________ liver profile in 12 weeks.  3. Add Altace 5 daily for better blood pressure control. We will check a      followup BMET in one week particularly in light of the patient's mild      renal insufficiency.  4. Schedule return clinic followup with me in two months for reassessment      of his symptoms and further recommendations. Decrease aspirin to 81      daily.  5. Return clinic followup with me in two months for reassessment of      patient's clinical status and further recommendations.     ______________________________  Rozell Searing, PA-C    ______________________________  Jonelle Sidle, MD   GS/MedQ  DD: 04/29/2006  DT: 04/30/2006  Job #: [id number]   cc:   Eveline Keto, M.D.

## 2010-11-17 NOTE — Discharge Summary (Signed)
**Note Craig-Identified via Obfuscation** NAME:  [name], [name] NO.:  [id number]   MEDICAL RECORD NO.:  [id number]                   PATIENT TYPE:  INP   LOCATION:  2007                                 FACILITY:  [facility]   PHYSICIAN:  Salvatore Decent. Dorris Fetch, M.D.         DATE OF BIRTH:  April 26, 1929   DATE OF ADMISSION:  05/07/2003  DATE OF DISCHARGE:  05/17/2003                                 DISCHARGE SUMMARY   HISTORY OF PRESENT ILLNESS:  This patient is a 77 year old gentleman with  history of hypertension, but no previous cardiac history.  He developed  approximately one week history of crescendo anginal symptoms.  He was  admitted to [facility] where he ruled in for a non Q-wave  myocardial infarction and subsequently was felt to require transfer and  subsequent cardiac catheterization at [facility] under the care of  [facility] Cardiology service.   PAST MEDICAL HISTORY:  1. Hypertension.  2. Questionable dyslipidemia.   PAST SURGICAL HISTORY:  1. Left knee replacement approximately one month ago.  2. Distant appendectomy.  3. Removal of his umbilicus for chronic infection.   MEDICATIONS ON ADMISSION:  None listed with the exception of those newly  started medications at transfer from [facility] including Lopressor,  aspirin, _________, and nitroglycerin.   ALLERGIES:  No known drug allergies.   FAMILY HISTORY:  Please see the history and physical done at the time of  admission.   SOCIAL HISTORY:  Please see the history and physical done at the time of  admission.   REVIEW OF SYSTEMS:  Please see the history and physical done at the time of  admission.   PHYSICAL EXAMINATION:  Please see the history and physical done at the time  of admission.   HOSPITAL COURSE:  The patient was transferred from [facility] to [facility]  where he underwent cardiac catheterization by Carole Binning, M.D. [facility] Cardiology service.  He was found to  have a normal left ventricular  ejection fraction estimated at greater than 60%, no mitral regurgitation.  Coronary angiography demonstrated severe three vessel coronary artery  disease with a 90-95% stenosis of the mid left anterior descending coronary  artery arising at the takeoff of the large diagonal branch and involving the  ostial portion of this large diagonal branch.  There was an 80% stenosis of  the first circumflex marginal branch as well as a 75% stenosis of the right  coronary artery.  Due to these findings, cardiac surgical consultation was  obtained and surgical revascularization was recommended to the patient.  The  procedure was subsequently scheduled and on May 10, 2003 the following  procedure was performed:  Coronary artery bypass grafting x4.  The following  grafts were placed:  Left internal mammary artery to the LAD, a saphenous  vein graft to the obtuse marginal, a saphenous vein graft to the right  coronary artery, a saphenous  vein graft to the diagonal coronary artery.  Cross clamp time was 70 minutes.  Pump time was 121 minutes.  The patient  tolerated procedure well and was taken to the surgical intensive care unit  in stable condition.  The patient was found to have good targets, good  conduits during the procedure.   POSTOPERATIVE HOSPITAL COURSE:  The patient has done well postoperatively.  He has had some minor postoperative issues.  He did have some postoperative  anemia that required transfusion, but has responded within normal  parameters.  He additionally had a slight bump in his creatinine to a level  of 2.2 but this has gradually returned into the normal range and most recent  is measured at 1.3 on May 16, 2003.  The patient has also had some  atrial dysrhythmias, but has been chemically cardioverted to a normal sinus  rhythm using Lopressor and Cardizem.  He has maintained normal sinus rhythm  for a few days.  Additionally, the patient  developed a postoperative ileus.  He did have some pain associated with this and a CT scan was obtained.  After bowel rest and cathartics, the patient's bowel function did gradually  return.  He was seen in consultation by Abigail Miyamoto, M.D. and was not  felt to have any surgical issues and the symptoms did improve and he  returned to a normal diet as well as had several bowel movements.   Currently, the patient is afebrile with stable hemodynamics.  He is somewhat  edematous and will require further diuresis as an outpatient.  This was  halted for a short time during the phase in which he had an ileus but his  Lasix has been restarted.  His incisions are healing well without signs of  infection.  He is tolerating cardiac rehabilitation phase I modalities  commensurate for level of postoperative convalescence.  Oxygen has been  weaned and he maintains good saturations on room air.  Most recent  hemoglobin and hematocrit dated May 17, 2003 are 9.2 and 26.6,  respectively.  Overall, it is felt that he is stable for discharge on  today's date, May 17, 2003.   DISCHARGE MEDICATIONS:  1. Aspirin 325 mg daily.  2. Lopressor 25 mg b.i.d.  3. Lasix 40 mg daily for one week.  4. K-Dur 20 mEq daily for one week.  5. Zocor 20 mg daily.  6. Cardizem CD 120 mg daily.  7. For pain, Tylox one to two q.4-6h. as needed.   DISCHARGE INSTRUCTIONS:  The patient will receive written instructions in  regard to medications, activity, diet, wound care, and follow-up.  Follow-up  will include Salvatore Decent. Dorris Fetch, M.D. on Monday, December 6 at 11:15.  Additionally, appointment will be arranged for the patient see Jonelle Sidle, M.D. [facility] in two weeks.   FINAL DIAGNOSES:  1. Severe coronary artery disease as described.  2. Hypertension.  3. Hypercholesterolemia.  4. Previous left knee replacement approximately one month ago. 5. Mild acute renal insufficiency postoperatively,  resolved.  6. Moderate anemia postoperatively, improved.  7. Postoperative ileus, improved.  8. Postoperative atrial fibrillation converted to normal sinus rhythm.      Rowe Clack, P.A.-C.                    Salvatore Decent Dorris Fetch, M.D.    Sherryll Burger  D:  05/17/2003  T:  05/17/2003  Job:  [id number]   cc:   Jonelle Sidle, M.D. [facility]  Xaje Hasanaj  [address]  [place]  [place] [address]  Fax: [phone]   Virgel Manifold, M.D.  [facility]   Abigail Miyamoto, M.D.  [phone]  [place]  [place] [address]  Fax: [phone]

## 2010-11-17 NOTE — Consult Note (Signed)
NAME:  [name], [name] NO.:  [id number]   MEDICAL RECORD NO.:  [id number]                   PATIENT TYPE:  INP   LOCATION:  2302                                 FACILITY:  [facility]   PHYSICIAN:  Abigail Miyamoto, M.D.              DATE OF BIRTH:  1928-08-30   DATE OF CONSULTATION:  05/13/2003  DATE OF DISCHARGE:                                   CONSULTATION   CHIEF COMPLAINT:  Postoperative ileus.   HISTORY:  This is a 77 year old gentleman who is three days status post  coronary artery bypass grafting.  He initially did well, but then yesterday  starting developing abdominal distention and then nausea and vomiting.  He  has since had a nasogastric tube placed with this.  He presents he has had  no flatus since surgery.  He does report feeling more comfortable with NG  tube although he has had some mild nausea and cramping.  He apparently has  had significant ileus in the past after recent knee surgery from orthopedic  standpoint.  This was done with spinal anesthesia, but took several days for  his bowels to wake up.  Currently, he denies chest pain or shortness of  breath.   PAST MEDICAL HISTORY:  Significant for having had an appendectomy in the  distant past and removal of his umbilicus for chronic infection.   CURRENT MEDICATIONS:  1. Zocor.  2. Aspirin.  3. Dulcolax.  4. Tylenol.  5. Lopressor.  6. Protonix.  7. Lasix.  8. Ultram.  9. Phenergan.  10.      Cardizem.  11.      Chloraseptic spray as needed.   ALLERGIES:  He has no known drug allergies.   SOCIAL HISTORY:  Does not smoke.  Does not drink alcohol.   REVIEW OF SYSTEMS:  Otherwise, unremarkable.   PHYSICAL EXAMINATION:  GENERAL:  Thin gentleman lying in bed sleeping.  He  is easily arousable.  He answers questions appropriately.  VITAL SIGNS:  Temperature 98.0, heart rate 88, respiratory rate 18, blood  pressure 120/50.  HEENT:  Eyes:  He is anicteric.  Pupils reactive  bilaterally.  Ears, nose,  mouth and throat:  External areas an nose are normal.  Hearing is normal.  Oropharynx is clear.  LUNGS:  Clear to auscultation bilaterally with normal respiratory effort.  He has well healing sternal incision.  CARDIOVASCULAR:  Regular  rate and rhythm.  No murmurs. There is minimal  peripheral edema.  ABDOMEN:  Distended.  Minimally tender diffusely.  There is no guarding.  He  has positive bowel sounds.  There are no hernias.  EXTREMITIES:  Warm and well perfused.   LABORATORY DATA:  The patient has white blood count 9.4, potassium 4.2 and  creatinine of 1.7.  Hemoglobin currently is 8.9.  X-ray data:  The patient  has KUB showing loops of small bowel without evidence of free  air.   ASSESSMENT/PLAN:  This is a patient with a probable ileus after recent  coronary artery bypass grafting.  At this point, we will place him on Reglan  and Dulcolax suppositories and continue NG suctioning and bowel rest.  I  agree with CAT scan of the abdomen that has been ordered to help rule out  ischemia, obstruction, ulcers, free air, etc.  Hopefully, this will resolve  with medications and conservative management.  We will follow Mr. Koelling  closely with you.                                               Abigail Miyamoto, M.D.    DB/MEDQ  D:  05/13/2003  T:  05/13/2003  Job:  [id number]

## 2010-11-22 ENCOUNTER — Other Ambulatory Visit: Payer: Self-pay | Admitting: *Deleted

## 2010-11-22 MED ORDER — ISOSORBIDE MONONITRATE ER 30 MG PO TB24: ORAL_TABLET | ORAL | Status: DC

## 2010-11-22 MED ORDER — LISINOPRIL 40 MG PO TABS: 40.0000 mg | ORAL_TABLET | Freq: Every day | ORAL | Status: DC

## 2010-11-22 MED ORDER — SIMVASTATIN 40 MG PO TABS: 40.0000 mg | ORAL_TABLET | Freq: Every day | ORAL | Status: DC

## 2010-11-22 MED ORDER — METOPROLOL TARTRATE 50 MG PO TABS: 50.0000 mg | ORAL_TABLET | Freq: Two times a day (BID) | ORAL | Status: DC

## 2011-01-08 ENCOUNTER — Other Ambulatory Visit: Payer: Self-pay | Admitting: *Deleted

## 2011-01-08 DIAGNOSIS — E785 Hyperlipidemia, unspecified: Secondary | ICD-10-CM

## 2011-01-08 DIAGNOSIS — I10 Essential (primary) hypertension: Secondary | ICD-10-CM

## 2011-01-08 DIAGNOSIS — Z79899 Other long term (current) drug therapy: Secondary | ICD-10-CM

## 2011-01-25 ENCOUNTER — Encounter: Payer: Self-pay | Admitting: Cardiology

## 2011-01-30 ENCOUNTER — Telehealth: Payer: Self-pay | Admitting: *Deleted

## 2011-01-30 ENCOUNTER — Encounter: Payer: Self-pay | Admitting: Cardiology

## 2011-01-30 ENCOUNTER — Ambulatory Visit (INDEPENDENT_AMBULATORY_CARE_PROVIDER_SITE_OTHER): Payer: Medicare Other | Admitting: Cardiology

## 2011-01-30 VITALS — BP 177/79 | HR 52 | Resp 18 | Ht 67.0 in | Wt 188.1 lb

## 2011-01-30 DIAGNOSIS — I10 Essential (primary) hypertension: Secondary | ICD-10-CM

## 2011-01-30 DIAGNOSIS — I2510 Atherosclerotic heart disease of native coronary artery without angina pectoris: Secondary | ICD-10-CM

## 2011-01-30 DIAGNOSIS — E785 Hyperlipidemia, unspecified: Secondary | ICD-10-CM

## 2011-01-30 DIAGNOSIS — N189 Chronic kidney disease, unspecified: Secondary | ICD-10-CM

## 2011-01-30 DIAGNOSIS — I359 Nonrheumatic aortic valve disorder, unspecified: Secondary | ICD-10-CM

## 2011-01-30 MED ORDER — HYDRALAZINE HCL 10 MG PO TABS: 10.0000 mg | ORAL_TABLET | Freq: Three times a day (TID) | ORAL | Status: DC

## 2011-01-30 MED ORDER — SIMVASTATIN 40 MG PO TABS: 40.0000 mg | ORAL_TABLET | Freq: Every evening | ORAL | Status: DC

## 2011-01-30 MED ORDER — ATORVASTATIN CALCIUM 20 MG PO TABS: 20.0000 mg | ORAL_TABLET | Freq: Every day | ORAL | Status: DC

## 2011-01-30 MED ORDER — AMLODIPINE BESYLATE 5 MG PO TABS: 5.0000 mg | ORAL_TABLET | Freq: Every day | ORAL | Status: DC

## 2011-01-30 NOTE — Telephone Encounter (Signed)
Pt's dgt states pt had been previously on Lipitor but it was stopped d/t pain in his legs. She states he also previously took Norvasc, but it was stopped. She is not positive why this was stopped, but she thinks it may have been b/c BP came down. She would like to know if he needs to restart Norvasc and change Zocor to Lipitor despite having previously tried both of these medications.

## 2011-01-30 NOTE — Telephone Encounter (Signed)
Addended by: Arlyss Gandy on: 01/30/2011 04:29 PM   Modules accepted: Orders

## 2011-01-30 NOTE — Assessment & Plan Note (Signed)
Blood pressure not optimally controlled. Plan to add amlodipine 5 mg daily which may also serve as an antianginal.

## 2011-01-30 NOTE — Assessment & Plan Note (Signed)
Recent creatinine 1.9. 

## 2011-01-30 NOTE — Telephone Encounter (Signed)
Pt's dgt, Pam, notified and verbalized understanding.

## 2011-01-30 NOTE — Progress Notes (Signed)
Clinical Summary [name] is a 77 y.o.male presenting for followup. He was last seen in January of this year.  Followup lab work from July 19 showed BUN 32, creatinine 1.9, AST 18, ALT 18, triglycerides 100, cholesterol 128, HDL 34, LDL 74, sodium 139, potassium 5.0. We reviewed these today.  He reports occasional angina and nitroglycerin use, not progressive. Has NYHA class 2-3 dyspnea on exertion intermittently depending on effort. Does continue to work outdoors as able, has had no palpitations or syncope.  Denies any significant lower extremity edema.  Blood pressure is elevated again. We discussed this, and plan to make some medication adjustments.  No Known Allergies  Medication list reviewed.  Past Medical History  Diagnosis Date  . Coronary atherosclerosis of native coronary artery     Multivessel, LVEF 65%  . Mixed hyperlipidemia   . Essential hypertension, benign   . Myocardial infarction   . Renal insufficiency   . Arthritis     Past Surgical History  Procedure Date  . Umbilical hernia repair   . Appendectomy   . Left total knee arthroplasty   . Coronary artery bypass graft     LIMA to LAD, SVG to OM, SVG to RCA, SVG to diagonal 11/04    History reviewed. No pertinent family history.  Social History [name] that he has never smoked. He has never used smokeless tobacco. [name] that he does not drink alcohol.  Review of Systems Otherwise negative except as outlined.  Physical Examination Filed Vitals:   01/30/11 1044  BP: 177/79  Pulse: 52  Resp: 18  Overweight male in no acute distress, somewhat hard of hearing.  HEENT: Conjunctiva and lids normal, oropharynx clear.  Neck: Supple, no elevated jugular venous pressure. No thyromegaly.  Lungs: Clear without labored breathing.  Cardiac: Regular rate and rhythm with 3/6 systolic murmur at the base. Radiates towards the carotids. No S3 gallop.  Abdomen: Soft, nontender, no bruits.  Bowel sounds present.  Skin: Warm and dry.  Extremities: No pitting edema. Distal pulses 2+.  Musculoskeletal: No kyphosis.  Neuropsychiatric: Alert oriented x3. Affect normal.   Studies: Echocardiogram 03/24/2009: Mild to moderate LVH with LVEF greater than 65%, diastolic dysfunction, moderate left atrial enlargement, mild mitral regurgitation, aortic calcification with evidence of mild aortic stenosis and mild aortic regurgitation, mean gradient 13 mm mercury, mild tricuspid regurgitation, RVSP 41 mm mercury.   Problem List and Plan

## 2011-01-30 NOTE — Patient Instructions (Signed)
Follow up as scheduled. Stop Zocor (simvastatin) Start Lipitor (atorvastatin) 20 mg every night. Start Norvasc (amlodipine) 5 mg daily. Your physician recommends that you go to the [facility] for a FASTING lipid profile and liver function labs. Do not eat or drink after midnight. DO LABS A FEW DAYS BEFORE NEXT APPT. CALL THE OFFICE TO HAVE ORDER SENT TO THE [facility].

## 2011-01-30 NOTE — Assessment & Plan Note (Signed)
Lipids have been relatively well controlled, recent LDL at goal. In light of institution of amlodipine, plan to switch from simvastatin to atorvastatin 20 mg daily. Followup fasting lipid profile and liver function tests for his next visit.

## 2011-01-30 NOTE — Assessment & Plan Note (Signed)
Mild aortic stenosis and aortic regurgitation, following clinically at this point.

## 2011-01-30 NOTE — Telephone Encounter (Signed)
Noted. Appreciate her call on this. In that case, why don't we continue present dose of simvastatin, and instead of initiating Norvasc, try hydralazine starting at 10 mg p.o. T.i.d. If he tolerates this, we can titrate from there.

## 2011-01-30 NOTE — Assessment & Plan Note (Signed)
Clinically stable on medical therapy. Continue observation. 

## 2011-02-13 ENCOUNTER — Other Ambulatory Visit: Payer: Self-pay | Admitting: *Deleted

## 2011-02-13 MED ORDER — SIMVASTATIN 40 MG PO TABS: 40.0000 mg | ORAL_TABLET | Freq: Every evening | ORAL | Status: DC

## 2011-03-15 ENCOUNTER — Other Ambulatory Visit: Payer: Self-pay | Admitting: *Deleted

## 2011-03-15 DIAGNOSIS — Z79899 Other long term (current) drug therapy: Secondary | ICD-10-CM

## 2011-03-15 DIAGNOSIS — E782 Mixed hyperlipidemia: Secondary | ICD-10-CM

## 2011-04-20 ENCOUNTER — Encounter: Payer: Self-pay | Admitting: Cardiology

## 2011-04-30 ENCOUNTER — Ambulatory Visit (INDEPENDENT_AMBULATORY_CARE_PROVIDER_SITE_OTHER): Payer: Medicare Other | Admitting: Cardiology

## 2011-04-30 ENCOUNTER — Encounter: Payer: Self-pay | Admitting: Cardiology

## 2011-04-30 VITALS — BP 144/72 | HR 48 | Ht 67.0 in | Wt 189.0 lb

## 2011-04-30 DIAGNOSIS — I10 Essential (primary) hypertension: Secondary | ICD-10-CM

## 2011-04-30 DIAGNOSIS — I2510 Atherosclerotic heart disease of native coronary artery without angina pectoris: Secondary | ICD-10-CM

## 2011-04-30 DIAGNOSIS — N189 Chronic kidney disease, unspecified: Secondary | ICD-10-CM

## 2011-04-30 DIAGNOSIS — E785 Hyperlipidemia, unspecified: Secondary | ICD-10-CM

## 2011-04-30 MED ORDER — HYDRALAZINE HCL 10 MG PO TABS: 10.0000 mg | ORAL_TABLET | Freq: Three times a day (TID) | ORAL | Status: DC

## 2011-04-30 NOTE — Assessment & Plan Note (Signed)
Blood pressure control is better today, hydralazine added at the last visit. Continue same for now.

## 2011-04-30 NOTE — Progress Notes (Signed)
Clinical Summary [name] is a 77 y.o.male presenting for followup. He was seen in July. Denies any progressive angina, although does have occasional symptoms. Stable NYHA class 2-3 dyspnea on exertion. No palpitations or syncope.  Followup lab work from September showed AST 16, ALT 21, triglycerides 96, cholesterol 127, HDL 38, LDL 70. We discussed these today. He reports tolerating his present medical regimen.  Since addition of hydralazine, blood pressure control is better in general.   Allergies  Allergen Reactions  . Lipitor (Atorvastatin Calcium)     Leg pain    Medication list reviewed.  Past Medical History  Diagnosis Date  . Coronary atherosclerosis of native coronary artery     Multivessel, LVEF 65%  . Mixed hyperlipidemia   . Essential hypertension, benign   . Myocardial infarction   . Renal insufficiency   . Arthritis     Past Surgical History  Procedure Date  . Umbilical hernia repair   . Appendectomy   . Left total knee arthroplasty   . Coronary artery bypass graft     LIMA to LAD, SVG to OM, SVG to RCA, SVG to diagonal 11/04    Social History [name] reports that he has never smoked. He has never used smokeless tobacco. [name] reports that he does not drink alcohol.  Review of Systems As outlined above, otherwise negative.  Physical Examination Filed Vitals:   04/30/11 0941  BP: 144/72  Pulse: 48    Overweight male in no acute distress, somewhat hard of hearing.  HEENT: Conjunctiva and lids normal, oropharynx clear.  Neck: Supple, no elevated jugular venous pressure. No thyromegaly.  Lungs: Clear without labored breathing.  Cardiac: Regular rate and rhythm with 3/6 systolic murmur at the base. Radiates towards the carotids. No S3 gallop.  Abdomen: Soft, nontender, no bruits. Bowel sounds present.  Skin: Warm and dry.  Extremities: No pitting edema. Distal pulses 2+.  Musculoskeletal: No kyphosis.  Neuropsychiatric: Alert oriented  x3. Affect normal.   ECG Sinus bradycardia at 53, otherwise normal.   Problem List and Plan

## 2011-04-30 NOTE — Assessment & Plan Note (Signed)
Last creatinine 1.9. 

## 2011-04-30 NOTE — Assessment & Plan Note (Signed)
Well-controlled, LDL 70, LFTs normal.

## 2011-04-30 NOTE — Patient Instructions (Signed)
Your physician wants you to follow-up in: 4 months. You will receive a reminder letter in the mail one-two months in advance. If you don't receive a letter, please call our office to schedule the follow-up appointment. Your physician recommends that you continue on your current medications as directed. Please refer to the Current Medication list given to you today.

## 2011-04-30 NOTE — Assessment & Plan Note (Signed)
Symptomatically stable on medical therapy. Plan to continue observation for now. Followup ECG reviewed.

## 2011-05-01 ENCOUNTER — Other Ambulatory Visit: Payer: Self-pay | Admitting: *Deleted

## 2011-05-01 ENCOUNTER — Encounter: Payer: Self-pay | Admitting: *Deleted

## 2011-05-01 MED ORDER — LISINOPRIL 40 MG PO TABS: 40.0000 mg | ORAL_TABLET | Freq: Every day | ORAL | Status: DC

## 2011-05-01 MED ORDER — ISOSORBIDE MONONITRATE ER 30 MG PO TB24: ORAL_TABLET | ORAL | Status: DC

## 2011-05-01 MED ORDER — SIMVASTATIN 40 MG PO TABS: 40.0000 mg | ORAL_TABLET | Freq: Every evening | ORAL | Status: DC

## 2011-05-01 NOTE — Telephone Encounter (Signed)
This encounter was created in error - please disregard.

## 2011-05-02 ENCOUNTER — Other Ambulatory Visit: Payer: Self-pay | Admitting: *Deleted

## 2011-05-02 MED ORDER — ISOSORBIDE MONONITRATE ER 30 MG PO TB24: ORAL_TABLET | ORAL | Status: DC

## 2011-08-03 ENCOUNTER — Other Ambulatory Visit: Payer: Self-pay | Admitting: Cardiology

## 2011-08-28 ENCOUNTER — Encounter: Payer: Self-pay | Admitting: Cardiology

## 2011-08-28 ENCOUNTER — Ambulatory Visit (INDEPENDENT_AMBULATORY_CARE_PROVIDER_SITE_OTHER): Payer: Medicare PPO | Admitting: Cardiology

## 2011-08-28 VITALS — BP 160/72 | HR 55 | Resp 18 | Ht 67.0 in | Wt 191.0 lb

## 2011-08-28 DIAGNOSIS — E785 Hyperlipidemia, unspecified: Secondary | ICD-10-CM

## 2011-08-28 DIAGNOSIS — I10 Essential (primary) hypertension: Secondary | ICD-10-CM

## 2011-08-28 DIAGNOSIS — I2510 Atherosclerotic heart disease of native coronary artery without angina pectoris: Secondary | ICD-10-CM

## 2011-08-28 DIAGNOSIS — I359 Nonrheumatic aortic valve disorder, unspecified: Secondary | ICD-10-CM

## 2011-08-28 MED ORDER — METOPROLOL TARTRATE 50 MG PO TABS: 50.0000 mg | ORAL_TABLET | Freq: Two times a day (BID) | ORAL | Status: DC

## 2011-08-28 NOTE — Patient Instructions (Addendum)
Your physician recommends that you schedule a follow-up appointment in: 6 months. You will receive a reminder letter in the mail about 1-2 months in advance. If you don't receive this letter, please contact our office.  Your physician has requested that you have an echocardiogram. Echocardiography is a painless test that uses sound waves to create images of your heart. It provides your doctor with information about the size and shape of your heart and how well your heart's chambers and valves are working. This procedure takes approximately one hour. There are no restrictions for this procedure. You will have this done in 6 months just before your next visit.  Your physician recommends that you return for lab work in: 6 months just before your next visit including a lipid and liver profile at the [facility].   Your physician recommends that you continue on your current medications as directed. Please refer to the Current Medication list given to you today.

## 2011-08-28 NOTE — Assessment & Plan Note (Signed)
No active angina on current medical regimen. Refills given for 90 day supplies. Continue observation for now.

## 2011-08-28 NOTE — Assessment & Plan Note (Signed)
Mild aortic stenosis by echocardiogram in 2010, fairly prominent cardiac murmur. Followup study to be arranged for his next visit.

## 2011-08-28 NOTE — Progress Notes (Signed)
Clinical Summary [name] is a 78 y.o.male presenting for followup. He was seen back in October 2012. He continues to report dyspnea on exertion, although overall stable. Still able to get outdoors some [place]. He is not reporting any angina. No palpitations or syncope.  He reports compliance with his medications, has been tolerating hydralazine. He requested 90 day supplies.  Last echocardiogram in 2002 and was reviewed. He has a sclerotic aortic valve, moderately thickened with reduced excursion of the non-coronary cusp, mild aortic stenosis, and mild aortic regurgitation.   Allergies  Allergen Reactions  . Lipitor (Atorvastatin Calcium)     Leg pain    Current Outpatient Prescriptions  Medication Sig Dispense Refill  . acetaminophen (TYLENOL) 325 MG tablet Take 650 mg by mouth every 6 (six) hours as needed.        Marland Kitchen aspirin 81 MG tablet Take 81 mg by mouth daily.        . famotidine (PEPCID) 20 MG tablet Take 20 mg by mouth 2 (two) times daily as needed.        . hydrALAZINE (APRESOLINE) 10 MG tablet Take 1 tablet (10 mg total) by mouth 3 (three) times daily.  270 tablet  3  . isosorbide mononitrate (IMDUR) 30 MG 24 hr tablet Take one-half by mouth daily   45 tablet  3  . lisinopril (PRINIVIL,ZESTRIL) 40 MG tablet Take 1 tablet (40 mg total) by mouth daily.  90 tablet  3  . metoprolol (LOPRESSOR) 50 MG tablet TAKE ONE TABLET BY MOUTH TWICE DAILY  60 tablet  6  . nitroGLYCERIN (NITROSTAT) 0.4 MG SL tablet Place 0.4 mg under the tongue every 5 (five) minutes as needed.        . Omega-3 Fatty Acids (FISH OIL) 1200 MG CAPS Take 1 capsule by mouth 3 (three) times daily.        . simvastatin (ZOCOR) 40 MG tablet Take 1 tablet (40 mg total) by mouth every evening.  90 tablet  3    Past Medical History  Diagnosis Date  . Coronary atherosclerosis of native coronary artery     Multivessel, LVEF 65%  . Mixed hyperlipidemia   . Essential hypertension, benign   . Myocardial  infarction   . Renal insufficiency   . Arthritis   . Aortic stenosis     Past Surgical History  Procedure Date  . Umbilical hernia repair   . Appendectomy   . Left total knee arthroplasty   . Coronary artery bypass graft     LIMA to LAD, SVG to OM, SVG to RCA, SVG to diagonal 11/04    Social History [name] reports that he has never smoked. He has never used smokeless tobacco. [name] reports that he does not drink alcohol.  Review of Systems Reports stable appetite. Hard of hearing, uses hearing aids. No falls. No reported bleeding problems. Otherwise negative.  Physical Examination Filed Vitals:   08/28/11 0853  BP: 160/72  Pulse: 55  Resp: 18    Overweight male in no acute distress, somewhat hard of hearing.  HEENT: Conjunctiva and lids normal, oropharynx clear.  Neck: Supple, no elevated jugular venous pressure. No thyromegaly.  Lungs: Clear without labored breathing.  Cardiac: Regular rate and rhythm with 3/6 systolic murmur at the left base, radiates to apex and towards the carotids. No S3 gallop.  Abdomen: Soft, nontender, no bruits. Bowel sounds present.  Skin: Warm and dry.  Extremities: No pitting edema. Distal pulses 2+.  Musculoskeletal: No kyphosis.  Neuropsychiatric: Alert oriented x3. Affect normal.    Problem List and Plan

## 2011-08-28 NOTE — Assessment & Plan Note (Signed)
Lipids have been well controlled over time. Repeat FLP and LFT for next visit.

## 2011-08-28 NOTE — Assessment & Plan Note (Signed)
Continue current regimen

## 2011-11-12 ENCOUNTER — Other Ambulatory Visit: Payer: Self-pay | Admitting: *Deleted

## 2011-11-12 DIAGNOSIS — E785 Hyperlipidemia, unspecified: Secondary | ICD-10-CM

## 2011-11-12 DIAGNOSIS — Z79899 Other long term (current) drug therapy: Secondary | ICD-10-CM

## 2012-02-01 ENCOUNTER — Other Ambulatory Visit: Payer: Self-pay | Admitting: *Deleted

## 2012-02-01 DIAGNOSIS — I359 Nonrheumatic aortic valve disorder, unspecified: Secondary | ICD-10-CM

## 2012-02-07 ENCOUNTER — Other Ambulatory Visit (INDEPENDENT_AMBULATORY_CARE_PROVIDER_SITE_OTHER): Payer: Medicare PPO

## 2012-02-07 ENCOUNTER — Other Ambulatory Visit: Payer: Self-pay

## 2012-02-07 DIAGNOSIS — I359 Nonrheumatic aortic valve disorder, unspecified: Secondary | ICD-10-CM

## 2012-02-12 ENCOUNTER — Telehealth: Payer: Self-pay | Admitting: *Deleted

## 2012-02-12 ENCOUNTER — Other Ambulatory Visit: Payer: Self-pay | Admitting: *Deleted

## 2012-02-12 DIAGNOSIS — E785 Hyperlipidemia, unspecified: Secondary | ICD-10-CM

## 2012-02-12 DIAGNOSIS — Z79899 Other long term (current) drug therapy: Secondary | ICD-10-CM

## 2012-02-12 NOTE — Telephone Encounter (Signed)
Called and reminded patient to have fasting lab work done b/4 appointment at [facility] lab. Lab orders faxed.

## 2012-02-12 NOTE — Telephone Encounter (Signed)
Message copied by Eustace Moore on Tue Feb 12, 2012 10:41 AM ------      Message from: Jonelle Sidle      Created: Fri Feb 08, 2012  9:27 PM       Reviewed. Normal LVEF, but aortic stenosis has progressed to at least moderate range. Will need to follow more closely now. Followup echocardiogram in 6 months please.

## 2012-02-12 NOTE — Telephone Encounter (Signed)
Patient informed. 

## 2012-02-22 ENCOUNTER — Telehealth: Payer: Self-pay | Admitting: *Deleted

## 2012-02-22 NOTE — Telephone Encounter (Signed)
Message copied by Eustace Moore on Fri Feb 22, 2012  3:25 PM ------      Message from: MCDOWELL, Illene Bolus      Created: Wed Feb 20, 2012  3:19 PM       AST and ALT are normal. Lipids remain well-controlled as noted, LDL 63.

## 2012-02-22 NOTE — Telephone Encounter (Signed)
Patient informed. 

## 2012-02-25 ENCOUNTER — Ambulatory Visit (INDEPENDENT_AMBULATORY_CARE_PROVIDER_SITE_OTHER): Payer: Medicare PPO | Admitting: Cardiology

## 2012-02-25 ENCOUNTER — Encounter: Payer: Self-pay | Admitting: Cardiology

## 2012-02-25 VITALS — BP 149/63 | HR 50 | Ht 67.0 in | Wt 186.0 lb

## 2012-02-25 DIAGNOSIS — I359 Nonrheumatic aortic valve disorder, unspecified: Secondary | ICD-10-CM

## 2012-02-25 DIAGNOSIS — I2510 Atherosclerotic heart disease of native coronary artery without angina pectoris: Secondary | ICD-10-CM

## 2012-02-25 DIAGNOSIS — E785 Hyperlipidemia, unspecified: Secondary | ICD-10-CM

## 2012-02-25 NOTE — Patient Instructions (Addendum)
Your physician recommends that you schedule a follow-up appointment in: 6 months. You will receive a reminder letter in the mail in about 4 months reminding you to call and schedule your appointment. If you don't receive this letter, please contact our office.  Your physician recommends that you continue on your current medications as directed. Please refer to the Current Medication list given to you today.  Your physician has requested that you have an echocardiogram just before your next appointment. Echocardiography is a painless test that uses sound waves to create images of your heart. It provides your doctor with information about the size and shape of your heart and how well your heart's chambers and valves are working. This procedure takes approximately one hour. There are no restrictions for this procedure.

## 2012-02-25 NOTE — Assessment & Plan Note (Signed)
ECG reviewed. Continue medical therapy and observation.

## 2012-02-25 NOTE — Assessment & Plan Note (Signed)
Lipids have been well controlled on simvastatin. ?

## 2012-02-25 NOTE — Assessment & Plan Note (Signed)
Moderate to severe aortic stenosis at this time. We discussed symptoms and options for management. Recommended he continue a basic walking regimen, be observant for changes in his symptom pattern. We will followup with an echocardiogram in 6 months.

## 2012-02-25 NOTE — Progress Notes (Signed)
Clinical Summary Mr. Mateo is a 78 y.o.male presenting for followup. I saw him back in February. He is here with his daughter. He does report functional fatigue and shortness of breath, occasional angina. Has used 3 nitroglycerin pills since last visit.  Followup echocardiogram in August showed LVEF of 55-60% with grade 2 diastolic dysfunction. Aortic valve stenosis now moderate to severe range, mean gradient 35 mm mercury, mildly thickened mitral valve with mild mitral regurgitation, moderate left atrial enlargement. Recent lipid panel shows normal AST and ALT, cholesterol 119, triglycerides 123, HDL 41, LDL 63.  We reviewed the results of his echocardiogram and lab work. We discussed the implications of progressive aortic stenosis, symptoms involved, also options for invasive management down the road, realizing his age does need to be considered.   Allergies  Allergen Reactions  . Lipitor (Atorvastatin Calcium)     Leg pain    Current Outpatient Prescriptions  Medication Sig Dispense Refill  . acetaminophen (TYLENOL) 325 MG tablet Take 650 mg by mouth every 6 (six) hours as needed.        Marland Kitchen aspirin 81 MG tablet Take 81 mg by mouth daily.        . famotidine (PEPCID) 20 MG tablet Take 20 mg by mouth 2 (two) times daily as needed.        . hydrALAZINE (APRESOLINE) 10 MG tablet Take 1 tablet (10 mg total) by mouth 3 (three) times daily.  270 tablet  3  . isosorbide mononitrate (IMDUR) 30 MG 24 hr tablet Take one-half by mouth daily   45 tablet  3  . lisinopril (PRINIVIL,ZESTRIL) 40 MG tablet Take 1 tablet (40 mg total) by mouth daily.  90 tablet  3  . metoprolol (LOPRESSOR) 50 MG tablet Take 1 tablet (50 mg total) by mouth 2 (two) times daily.  180 tablet  3  . nitroGLYCERIN (NITROSTAT) 0.4 MG SL tablet Place 0.4 mg under the tongue every 5 (five) minutes as needed.        . Omega-3 Fatty Acids (FISH OIL) 1200 MG CAPS Take 1 capsule by mouth 3 (three) times daily.        . simvastatin  (ZOCOR) 40 MG tablet Take 1 tablet (40 mg total) by mouth every evening.  90 tablet  3    Past Medical History  Diagnosis Date  . Coronary atherosclerosis of native coronary artery     Multivessel, LVEF 65%  . Mixed hyperlipidemia   . Essential hypertension, benign   . Myocardial infarction   . Renal insufficiency   . Arthritis   . Aortic stenosis     Past Surgical History  Procedure Date  . Umbilical hernia repair   . Appendectomy   . Left total knee arthroplasty   . Coronary artery bypass graft     LIMA to LAD, SVG to OM, SVG to RCA, SVG to diagonal 11/04    Social History Mr. Hathorne reports that he has never smoked. He has never used smokeless tobacco. Mr. Alcoser reports that he does not drink alcohol.  Review of Systems No palpitations, syncope. Stable appetite. No bleeding problems. Reports compliance with medications.  Physical Examination Filed Vitals:   02/25/12 0817  BP: 149/63  Pulse: 50    Overweight male in no acute distress, somewhat hard of hearing.  HEENT: Conjunctiva and lids normal, oropharynx clear.  Neck: Supple, no elevated jugular venous pressure. No thyromegaly.  Lungs: Clear without labored breathing.  Cardiac: Regular rate and rhythm with 3/6  systolic murmur at the left base, radiates to apex and towards the carotids. No S3 gallop.  Abdomen: Soft, nontender, no bruits. Bowel sounds present.  Skin: Warm and dry.  Extremities: No pitting edema. Distal pulses 2+.  Musculoskeletal: No kyphosis.  Neuropsychiatric: Alert oriented x3. Affect normal.   ECG Reviewed in EMR.   Problem List and Plan   Aortic valve disorders Moderate to severe aortic stenosis at this time. We discussed symptoms and options for management. Recommended he continue a basic walking regimen, be observant for changes in his symptom pattern. We will followup with an echocardiogram in 6 months.  CORONARY ATHEROSCLEROSIS NATIVE CORONARY ARTERY ECG reviewed. Continue  medical therapy and observation.  HYPERLIPIDEMIA-MIXED Lipids have been well controlled on simvastatin.    Jonelle Sidle, M.D., F.A.C.C.

## 2012-04-28 ENCOUNTER — Other Ambulatory Visit: Payer: Self-pay | Admitting: Cardiology

## 2012-04-28 MED ORDER — NITROGLYCERIN 0.4 MG SL SUBL: 0.4000 mg | SUBLINGUAL_TABLET | SUBLINGUAL | Status: DC | PRN

## 2012-04-28 NOTE — Telephone Encounter (Signed)
Patient wants Nitrostat 0.4mg  called to the [facility] in [place]

## 2012-04-30 ENCOUNTER — Other Ambulatory Visit: Payer: Self-pay | Admitting: Cardiology

## 2012-05-16 ENCOUNTER — Other Ambulatory Visit: Payer: Self-pay | Admitting: Cardiology

## 2012-05-19 ENCOUNTER — Other Ambulatory Visit: Payer: Self-pay | Admitting: Cardiology

## 2012-05-19 MED ORDER — METOPROLOL TARTRATE 50 MG PO TABS: 50.0000 mg | ORAL_TABLET | Freq: Two times a day (BID) | ORAL | Status: DC

## 2012-05-19 MED ORDER — ISOSORBIDE MONONITRATE ER 30 MG PO TB24: ORAL_TABLET | ORAL | Status: DC

## 2012-05-19 MED ORDER — HYDRALAZINE HCL 10 MG PO TABS: 10.0000 mg | ORAL_TABLET | Freq: Three times a day (TID) | ORAL | Status: DC

## 2012-05-19 MED ORDER — SIMVASTATIN 40 MG PO TABS: 40.0000 mg | ORAL_TABLET | Freq: Every evening | ORAL | Status: DC

## 2012-05-19 NOTE — Telephone Encounter (Signed)
rx sent to pharmacy by e-script for metoprolol per notations for medication refill request was noted as no medication requests pending, after review of cardiology medication noted, the only medication that needed refilling was metoprolol, thus a refill for 6 months was sent per pt will have 6 month follow up from 8-13

## 2012-08-21 ENCOUNTER — Other Ambulatory Visit (INDEPENDENT_AMBULATORY_CARE_PROVIDER_SITE_OTHER): Payer: Medicare PPO

## 2012-08-21 ENCOUNTER — Other Ambulatory Visit: Payer: Self-pay

## 2012-08-21 DIAGNOSIS — I359 Nonrheumatic aortic valve disorder, unspecified: Secondary | ICD-10-CM

## 2012-08-21 DIAGNOSIS — I2510 Atherosclerotic heart disease of native coronary artery without angina pectoris: Secondary | ICD-10-CM

## 2012-09-01 ENCOUNTER — Ambulatory Visit (INDEPENDENT_AMBULATORY_CARE_PROVIDER_SITE_OTHER): Payer: Medicare PPO | Admitting: Cardiology

## 2012-09-01 ENCOUNTER — Encounter: Payer: Self-pay | Admitting: Cardiology

## 2012-09-01 VITALS — BP 148/68 | HR 51 | Ht 67.0 in | Wt 187.0 lb

## 2012-09-01 DIAGNOSIS — E785 Hyperlipidemia, unspecified: Secondary | ICD-10-CM

## 2012-09-01 DIAGNOSIS — I359 Nonrheumatic aortic valve disorder, unspecified: Secondary | ICD-10-CM

## 2012-09-01 DIAGNOSIS — I350 Nonrheumatic aortic (valve) stenosis: Secondary | ICD-10-CM

## 2012-09-01 DIAGNOSIS — I2510 Atherosclerotic heart disease of native coronary artery without angina pectoris: Secondary | ICD-10-CM

## 2012-09-01 NOTE — Patient Instructions (Addendum)
Your physician recommends that you schedule a follow-up appointment in: 6 months. You will receive a reminder letter in the mail in about 4 months reminding you to call and schedule your appointment. If you don't receive this letter, please contact our office. Your physician recommends that you continue on your current medications as directed. Please refer to the Current Medication list given to you today. Your physician has requested that you have an echocardiogram i 6 months. Echocardiography is a painless test that uses sound waves to create images of your heart. It provides your doctor with information about the size and shape of your heart and how well your heart's chambers and valves are working. This procedure takes approximately one hour. There are no restrictions for this procedure.

## 2012-09-01 NOTE — Assessment & Plan Note (Signed)
Continue statin therapy. Lipids have been well controlled over time.

## 2012-09-01 NOTE — Assessment & Plan Note (Signed)
No active angina symptoms. Continue medical therapy and observation. 

## 2012-09-01 NOTE — Progress Notes (Signed)
Clinical Summary Mr. Hippe is an 79 y.o.male presenting for followup. I last saw him in August 2013. He is here with his wife today. He does not indicate any obvious  progressive decline in functional capacity. Reports NYHA class 2-3 dyspnea which is chronic, no angina.  Recent followup echocardiogram showed mild LVH with LVEF 60-65%, grade 1 diastolic dysfunction, moderate to severe aortic stenosis with trivial regurgitation, mean gradient 23 mm mercury, LVOT to AV ratio 0.34. No marked change compared to previous study. We reviewed the results today.  He continues to follow with Dr. Olena Leatherwood. Lipids have been well controlled over time.  Again, we reviewed the natural history and progression of aortic stenosis, possible options for management, typically surgical. For now we willl continue observation.   Allergies  Allergen Reactions  . Lipitor (Atorvastatin Calcium)     Leg pain    Current Outpatient Prescriptions  Medication Sig Dispense Refill  . acetaminophen (TYLENOL) 325 MG tablet Take 650 mg by mouth every 6 (six) hours as needed.        Marland Kitchen aspirin 81 MG tablet Take 81 mg by mouth daily.        . famotidine (PEPCID) 20 MG tablet Take 20 mg by mouth 2 (two) times daily as needed.        . hydrALAZINE (APRESOLINE) 10 MG tablet Take 1 tablet (10 mg total) by mouth 3 (three) times daily.  270 tablet  3  . isosorbide mononitrate (IMDUR) 30 MG 24 hr tablet Take one-half by mouth daily  45 tablet  3  . lisinopril (PRINIVIL,ZESTRIL) 40 MG tablet TAKE ONE TABLET BY MOUTH EVERY DAY  90 tablet  2  . metoprolol (LOPRESSOR) 50 MG tablet Take 1 tablet (50 mg total) by mouth 2 (two) times daily.  180 tablet  1  . nitroGLYCERIN (NITROSTAT) 0.4 MG SL tablet Place 1 tablet (0.4 mg total) under the tongue every 5 (five) minutes as needed.  25 tablet  3  . Omega-3 Fatty Acids (FISH OIL) 1200 MG CAPS Take 1 capsule by mouth 3 (three) times daily.        . simvastatin (ZOCOR) 40 MG tablet Take 1  tablet (40 mg total) by mouth every evening.  90 tablet  3   No current facility-administered medications for this visit.    Past Medical History  Diagnosis Date  . Coronary atherosclerosis of native coronary artery     Multivessel, LVEF 65%  . Mixed hyperlipidemia   . Essential hypertension, benign   . Myocardial infarction   . Renal insufficiency   . Arthritis   . Aortic stenosis     Past Surgical History  Procedure Laterality Date  . Umbilical hernia repair    . Appendectomy    . Left total knee arthroplasty    . Coronary artery bypass graft      LIMA to LAD, SVG to OM, SVG to RCA, SVG to diagonal 11/04    Social History Mr. Diekman reports that he has never smoked. He has never used smokeless tobacco. Mr. Miron reports that he does not drink alcohol.  Review of Systems Reports having had a "sroke" in his right eye, seen by an eye specialist in [place]. He reports no palpitations or syncope. No orthopnea, no lower extremity edema.  Physical Examination Filed Vitals:   09/01/12 0845  BP: 148/68  Pulse: 51   Filed Weights   09/01/12 0845  Weight: 187 lb (84.823 kg)   Overweight male in no  acute distress, somewhat hard of hearing.  HEENT: Conjunctiva and lids normal, oropharynx clear.  Neck: Supple, no elevated jugular venous pressure. No thyromegaly.  Lungs: Clear without labored breathing.  Cardiac: Regular rate and rhythm with 3/6 systolic murmur at the left base, radiates to apex and towards the carotids. No S3 gallop.  Abdomen: Soft, nontender, no bruits. Bowel sounds present.  Skin: Warm and dry.  Extremities: No pitting edema. Distal pulses 2+.  Musculoskeletal: No kyphosis.  Neuropsychiatric: Alert oriented x3. Affect normal.   Problem List and Plan   Aortic stenosis Moderate-to-severe by recent followup echocardiogram, stable since last examination. We discussed natural progression, warning signs and symptoms, and will plan a followup  echocardiogram in 6 months with review at that time.  CORONARY ATHEROSCLEROSIS NATIVE CORONARY ARTERY No active angina symptoms. Continue medical therapy and observation.  HYPERLIPIDEMIA-MIXED Continue statin therapy. Lipids have been well controlled over time.    Jonelle Sidle, M.D., F.A.C.C.

## 2012-09-01 NOTE — Assessment & Plan Note (Signed)
Moderate-to-severe by recent followup echocardiogram, stable since last examination. We discussed natural progression, warning signs and symptoms, and will plan a followup echocardiogram in 6 months with review at that time.

## 2012-10-22 ENCOUNTER — Other Ambulatory Visit: Payer: Self-pay | Admitting: *Deleted

## 2012-10-22 MED ORDER — METOPROLOL TARTRATE 50 MG PO TABS: 50.0000 mg | ORAL_TABLET | Freq: Two times a day (BID) | ORAL | Status: DC

## 2013-01-06 ENCOUNTER — Telehealth: Payer: Self-pay | Admitting: *Deleted

## 2013-01-06 NOTE — Telephone Encounter (Signed)
Daughter Elita Quick) called stating her father has dental appointment on Thursday for annual cleaning & questions if he needs ABO prior.   Informed her that he has no indication for antibiotic prior to dental appointment.  No hx of bacterial endocarditis or valve replacement.

## 2013-01-15 ENCOUNTER — Other Ambulatory Visit: Payer: Self-pay | Admitting: Cardiology

## 2013-01-15 MED ORDER — LISINOPRIL 40 MG PO TABS: 40.0000 mg | ORAL_TABLET | Freq: Every day | ORAL | Status: DC

## 2013-03-12 ENCOUNTER — Other Ambulatory Visit (INDEPENDENT_AMBULATORY_CARE_PROVIDER_SITE_OTHER): Payer: Medicare PPO

## 2013-03-12 ENCOUNTER — Other Ambulatory Visit: Payer: Self-pay

## 2013-03-12 DIAGNOSIS — I359 Nonrheumatic aortic valve disorder, unspecified: Secondary | ICD-10-CM

## 2013-03-12 DIAGNOSIS — I350 Nonrheumatic aortic (valve) stenosis: Secondary | ICD-10-CM

## 2013-03-18 ENCOUNTER — Telehealth: Payer: Self-pay | Admitting: Cardiology

## 2013-03-18 NOTE — Telephone Encounter (Signed)
Message copied by Burnice Logan on Wed Mar 18, 2013  4:35 PM ------      Message from: MCDOWELL, Illene Bolus      Created: Mon Mar 16, 2013  8:38 AM       Reviewed report. Still with evidence of moderate to severe aortic stenosis as before. Gradient has increased somewhat. We will continue to follow with him and review at office visit. ------

## 2013-03-18 NOTE — Telephone Encounter (Signed)
Pt gave verbal consent to release results to wife. Pt wife informed of results.

## 2013-03-26 ENCOUNTER — Ambulatory Visit (INDEPENDENT_AMBULATORY_CARE_PROVIDER_SITE_OTHER): Payer: Medicare PPO | Admitting: Cardiology

## 2013-03-26 ENCOUNTER — Encounter: Payer: Self-pay | Admitting: Cardiology

## 2013-03-26 VITALS — BP 118/59 | HR 60 | Ht 70.0 in | Wt 184.1 lb

## 2013-03-26 DIAGNOSIS — I359 Nonrheumatic aortic valve disorder, unspecified: Secondary | ICD-10-CM

## 2013-03-26 DIAGNOSIS — I2510 Atherosclerotic heart disease of native coronary artery without angina pectoris: Secondary | ICD-10-CM

## 2013-03-26 DIAGNOSIS — N189 Chronic kidney disease, unspecified: Secondary | ICD-10-CM

## 2013-03-26 DIAGNOSIS — I350 Nonrheumatic aortic (valve) stenosis: Secondary | ICD-10-CM

## 2013-03-26 NOTE — Assessment & Plan Note (Signed)
Moderate to severe aortic stenosis as detailed above. I reviewed this with the patient and his daughter today. Our plan will be to followup in 6 months with a repeat echocardiogram. If he is agreeable, we will likely refer him eventually to our valve clinic to discuss options. He is very reluctant to even consider a redo thoracic operation however.

## 2013-03-26 NOTE — Assessment & Plan Note (Signed)
Multivessel disease status post CABG 2004.

## 2013-03-26 NOTE — Patient Instructions (Addendum)
Your physician recommends that you schedule a follow-up appointment in: 6 months. You will receive a reminder letter in the mail in about 4 months reminding you to call and schedule your appointment. If you don't receive this letter, please contact our office. Your physician recommends that you continue on your current medications as directed. Please refer to the Current Medication list given to you today. Your physician has requested that you have an echocardiogram in 6 months just before your next visit. Echocardiography is a painless test that uses sound waves to create images of your heart. It provides your doctor with information about the size and shape of your heart and how well your heart's chambers and valves are working. This procedure takes approximately one hour. There are no restrictions for this procedure. You will be contacted when this is due.  

## 2013-03-26 NOTE — Progress Notes (Signed)
Clinical Summary [name] is an 79 y.o.male last seen in March. He is here with his daughter today. Reports stable dyspnea on exertion, no chest pain. Still able to do some yard work. We reviewed his echocardiogram.  Recent followup echocardiogram shows moderate LVH with LVEF 65-70%, grade 1 diastolic dysfunction, moderate to severe aortic stenosis with mild regurgitation, mean gradient 30-38 mm mercury, trivial mitral regurgitation, mild to moderate left atrial enlargement, mild tricuspid regurgitation, RV-RA gradient 29 mmHg.  We have discussed options for eventually considering aortic valve replacement, although he is very reluctant to even contemplate a redo thoracic operation. He does have renal insufficiency and his risk for nephropathy would be increased, even with preoperative assessment via angiographic imaging. He might be a candidate for TAVR.   Allergies  Allergen Reactions  . Lipitor [Atorvastatin Calcium]     Leg pain    Current Outpatient Prescriptions  Medication Sig Dispense Refill  . acetaminophen (TYLENOL) 325 MG tablet Take 650 mg by mouth every 6 (six) hours as needed.        Marland Kitchen aspirin 81 MG tablet Take 81 mg by mouth daily.        . famotidine (PEPCID) 20 MG tablet Take 20 mg by mouth 2 (two) times daily as needed.        . hydrALAZINE (APRESOLINE) 10 MG tablet Take 1 tablet (10 mg total) by mouth 3 (three) times daily.  270 tablet  3  . isosorbide mononitrate (IMDUR) 30 MG 24 hr tablet Take one-half by mouth daily  45 tablet  3  . lisinopril (PRINIVIL,ZESTRIL) 40 MG tablet Take 1 tablet (40 mg total) by mouth daily.  90 tablet  2  . metoprolol (LOPRESSOR) 50 MG tablet Take 1 tablet (50 mg total) by mouth 2 (two) times daily.  180 tablet  3  . nitroGLYCERIN (NITROSTAT) 0.4 MG SL tablet Place 1 tablet (0.4 mg total) under the tongue every 5 (five) minutes as needed.  25 tablet  3  . Omega-3 Fatty Acids (FISH OIL) 1200 MG CAPS Take 1 capsule by mouth 3 (three) times  daily.        . simvastatin (ZOCOR) 40 MG tablet Take 1 tablet (40 mg total) by mouth every evening.  90 tablet  3   No current facility-administered medications for this visit.    Past Medical History  Diagnosis Date  . Coronary atherosclerosis of native coronary artery     Multivessel, LVEF 65%  . Mixed hyperlipidemia   . Essential hypertension, benign   . Myocardial infarction   . Renal insufficiency   . Arthritis   . Aortic stenosis     Past Surgical History  Procedure Laterality Date  . Umbilical hernia repair    . Appendectomy    . Left total knee arthroplasty    . Coronary artery bypass graft      LIMA to LAD, SVG to OM, SVG to RCA, SVG to diagonal 11/04    Social History [name] that he has never smoked. He has never used smokeless tobacco. [name] that he does not drink alcohol.  Review of Systems Head no palpitations, syncope. No reported bleeding episodes. No orthopnea. Stable appetite. Otherwise negative.  Physical Examination Filed Vitals:   03/26/13 1448  BP: 118/59  Pulse: 60   Filed Weights   03/26/13 1448  Weight: 184 lb 1.9 oz (83.516 kg)    Overweight male in no acute distress, somewhat hard of hearing.  HEENT:  Conjunctiva and lids normal, oropharynx clear.  Neck: Supple, no elevated jugular venous pressure. No thyromegaly.  Lungs: Clear without labored breathing.  Cardiac: Regular rate and rhythm with 3/6 systolic murmur at the left base, radiates to apex and towards the carotids. No S3 gallop.  Abdomen: Soft, nontender, no bruits. Bowel sounds present.  Skin: Warm and dry.  Extremities: No pitting edema. Distal pulses 2+.  Musculoskeletal: No kyphosis.  Neuropsychiatric: Alert oriented x3. Affect normal.   Problem List and Plan   Aortic stenosis Moderate to severe aortic stenosis as detailed above. I reviewed this with the patient and his daughter today. Our plan will be to followup in 6 months with a repeat  echocardiogram. If he is agreeable, we will likely refer him eventually to our valve clinic to discuss options. He is very reluctant to even consider a redo thoracic operation however.  CORONARY ATHEROSCLEROSIS NATIVE CORONARY ARTERY Multivessel disease status post CABG 2004.  RENAL INSUFFICIENCY, CHRONIC Last creatinine was 1.9.    Jonelle Sidle, M.D., F.A.C.C.

## 2013-03-26 NOTE — Assessment & Plan Note (Signed)
Last creatinine was 1.9. 

## 2013-04-27 HISTORY — PX: CATARACT EXTRACTION: SUR2

## 2013-05-19 ENCOUNTER — Other Ambulatory Visit: Payer: Self-pay | Admitting: Cardiology

## 2013-05-19 MED ORDER — ISOSORBIDE MONONITRATE ER 30 MG PO TB24: ORAL_TABLET | ORAL | Status: DC

## 2013-05-19 MED ORDER — HYDRALAZINE HCL 10 MG PO TABS: 10.0000 mg | ORAL_TABLET | Freq: Three times a day (TID) | ORAL | Status: DC

## 2013-06-17 ENCOUNTER — Other Ambulatory Visit: Payer: Self-pay | Admitting: *Deleted

## 2013-06-17 MED ORDER — SIMVASTATIN 40 MG PO TABS: 40.0000 mg | ORAL_TABLET | Freq: Every evening | ORAL | Status: DC

## 2013-09-04 ENCOUNTER — Encounter: Payer: Self-pay | Admitting: *Deleted

## 2013-09-08 ENCOUNTER — Encounter: Payer: Self-pay | Admitting: Cardiology

## 2013-09-08 ENCOUNTER — Ambulatory Visit (INDEPENDENT_AMBULATORY_CARE_PROVIDER_SITE_OTHER): Payer: Medicare PPO | Admitting: Cardiology

## 2013-09-08 VITALS — BP 159/72 | HR 53 | Ht 70.0 in | Wt 186.4 lb

## 2013-09-08 DIAGNOSIS — I350 Nonrheumatic aortic (valve) stenosis: Secondary | ICD-10-CM

## 2013-09-08 DIAGNOSIS — E785 Hyperlipidemia, unspecified: Secondary | ICD-10-CM

## 2013-09-08 DIAGNOSIS — I2510 Atherosclerotic heart disease of native coronary artery without angina pectoris: Secondary | ICD-10-CM

## 2013-09-08 DIAGNOSIS — N189 Chronic kidney disease, unspecified: Secondary | ICD-10-CM

## 2013-09-08 DIAGNOSIS — I359 Nonrheumatic aortic valve disorder, unspecified: Secondary | ICD-10-CM

## 2013-09-08 NOTE — Assessment & Plan Note (Signed)
He continues on Zocor, followed by Dr. Olena LeatherwoodHasanaj.

## 2013-09-08 NOTE — Assessment & Plan Note (Signed)
Exertional shortness of breath and stable angina, likely combination of ischemic heart disease and progressive aortic stenosis. Continuing medical therapy for now with followup echocardiogram pending.

## 2013-09-08 NOTE — Assessment & Plan Note (Signed)
Moderate to severe aortic stenosis by echocardiogram in September 2014. Followup study arranged for later this week. May need to consider referral to Dr. Excell Seltzerooper in the valve clinic to discuss options. He is very reluctant to even consider a redo thoracic operation however. He may be a candidate for TAVR.

## 2013-09-08 NOTE — Patient Instructions (Signed)
Your physician recommends that you schedule a follow-up appointment in: 6 months. You will receive a reminder letter in the mail in about 4 months reminding you to call and schedule your appointment. If you don't receive this letter, please contact our office. Your physician recommends that you continue on your current medications as directed. Please refer to the Current Medication list given to you today. Your physician has requested that you have an echocardiogram. Echocardiography is a painless test that uses sound waves to create images of your heart. It provides your doctor with information about the size and shape of your heart and how well your heart's chambers and valves are working. This procedure takes approximately one hour. There are no restrictions for this procedure.  

## 2013-09-08 NOTE — Progress Notes (Signed)
Clinical Summary [name] last seen in September 2014. He reports continued dyspnea on exertion, tries to walk for exercise most days of the week. He goes to a track at his church, can walk about 100 yards but has to stop a few times. He also has exertional angina symptoms that are responsive to nitroglycerin.  He has had evidence of slowly progressive aortic stenosis. Echocardiogram in September 2014 showed moderate LVH with LVEF 65-70%, grade 1 diastolic dysfunction, moderate to severe aortic stenosis with mild regurgitation, mean gradient 30-38 mm mercury, trivial mitral regurgitation, mild to moderate left atrial enlargement, mild tricuspid regurgitation, RV-RA gradient 29 mmHg.  We have been managing him medically otherwise for ischemic heart disease, probably does have some graft disease, previous CABG in 2004. He does have renal insufficiency at baseline, increasing his risk of contrast nephropathy.  We reviewed his overall cardiac status, plan followup echocardiogram later this week for reassessment aortic stenosis.   Allergies  Allergen Reactions  . Lipitor [Atorvastatin Calcium]     Leg pain    Current Outpatient Prescriptions  Medication Sig Dispense Refill  . acetaminophen (TYLENOL) 325 MG tablet Take 650 mg by mouth every 6 (six) hours as needed.        Marland Kitchen. aspirin 81 MG tablet Take 81 mg by mouth daily.        . famotidine (PEPCID) 20 MG tablet Take 20 mg by mouth 2 (two) times daily as needed.        . hydrALAZINE (APRESOLINE) 10 MG tablet Take 1 tablet (10 mg total) by mouth 3 (three) times daily.  270 tablet  3  . isosorbide mononitrate (IMDUR) 30 MG 24 hr tablet Take one-half by mouth daily  45 tablet  3  . lisinopril (PRINIVIL,ZESTRIL) 40 MG tablet Take 1 tablet (40 mg total) by mouth daily.  90 tablet  2  . metoprolol (LOPRESSOR) 50 MG tablet Take 1 tablet (50 mg total) by mouth 2 (two) times daily.  180 tablet  3  . nitroGLYCERIN (NITROSTAT) 0.4  MG SL tablet Place 1 tablet (0.4 mg total) under the tongue every 5 (five) minutes as needed.  25 tablet  3  . Omega-3 Fatty Acids (FISH OIL) 1200 MG CAPS Take 1 capsule by mouth 3 (three) times daily.        . simvastatin (ZOCOR) 40 MG tablet Take 1 tablet (40 mg total) by mouth every evening.  30 tablet  6   No current facility-administered medications for this visit.    Past Medical History  Diagnosis Date  . Coronary atherosclerosis of native coronary artery     Multivessel, LVEF 65%  . Mixed hyperlipidemia   . Essential hypertension, benign   . Myocardial infarction   . Renal insufficiency   . Arthritis   . Aortic stenosis     Past Surgical History  Procedure Laterality Date  . Umbilical hernia repair    . Appendectomy    . Left total knee arthroplasty    . Coronary artery bypass graft      LIMA to LAD, SVG to OM, SVG to RCA, SVG to diagonal 11/04  . Cataract extraction Left 04/27/13    Dr. Lita Hayes    Social History [name] that he has never smoked. He has never used smokeless tobacco. [name] that he does not drink alcohol.  Review of Systems No palpitations, dizziness, syncope. No bleeding problems. Stable appetite. Hard of hearing. States he  will be having cataract removal perhaps next month. Otherwise negative.  Physical Examination Filed Vitals:   09/08/13 0817  BP: 159/72  Pulse: 53   Filed Weights   09/08/13 0817  Weight: 186 lb 6.4 oz (84.55 kg)    Overweight male in no acute distress, somewhat hard of hearing.  HEENT: Conjunctiva and lids normal, oropharynx clear.  Neck: Supple, no elevated jugular venous pressure. No thyromegaly.  Lungs: Clear without labored breathing.  Cardiac: Regular rate and rhythm with 3/6 systolic murmur at the left base, radiates to apex and towards the carotids. No S3 gallop.  Abdomen: Soft, nontender, no bruits. Bowel sounds present.  Skin: Warm and dry.  Extremities: No pitting edema. Distal  pulses 2+.  Musculoskeletal: No kyphosis.  Neuropsychiatric: Alert oriented x3. Affect normal.   Problem List and Plan   Aortic stenosis Moderate to severe aortic stenosis by echocardiogram in September 2014. Followup study arranged for later this week. May need to consider referral to Dr. Excell Hayes in the valve clinic to discuss options. He is very reluctant to even consider a redo thoracic operation however. He may be a candidate for TAVR.  CORONARY ATHEROSCLEROSIS NATIVE CORONARY ARTERY Exertional shortness of breath and stable angina, likely combination of ischemic heart disease and progressive aortic stenosis. Continuing medical therapy for now with followup echocardiogram pending.  RENAL INSUFFICIENCY, CHRONIC Last creatinine was 1.9.  HYPERLIPIDEMIA-MIXED He continues on Zocor, followed by Dr. Olena Hayes.    [name], M.D., F.A.C.C.

## 2013-09-08 NOTE — Assessment & Plan Note (Signed)
Last creatinine was 1.9.

## 2013-09-10 ENCOUNTER — Other Ambulatory Visit: Payer: Self-pay

## 2013-09-10 ENCOUNTER — Telehealth: Payer: Self-pay | Admitting: *Deleted

## 2013-09-10 ENCOUNTER — Other Ambulatory Visit (INDEPENDENT_AMBULATORY_CARE_PROVIDER_SITE_OTHER): Payer: Medicare PPO

## 2013-09-10 DIAGNOSIS — I2510 Atherosclerotic heart disease of native coronary artery without angina pectoris: Secondary | ICD-10-CM

## 2013-09-10 DIAGNOSIS — I350 Nonrheumatic aortic (valve) stenosis: Secondary | ICD-10-CM

## 2013-09-10 DIAGNOSIS — I359 Nonrheumatic aortic valve disorder, unspecified: Secondary | ICD-10-CM

## 2013-09-10 NOTE — Telephone Encounter (Signed)
Patient and wife informed and verbalized understanding of plan. Daughter currently out of town.

## 2013-09-10 NOTE — Telephone Encounter (Signed)
Message copied by Eustace MooreANDERSON, Dayzha Pogosyan M on Thu Sep 10, 2013  3:58 PM ------      Message from: MCDOWELL, Illene BolusSAMUEL G      Created: Thu Sep 10, 2013  1:43 PM       Please let him know that the degree of aortic stenosis has progressed, in the severe range at this point. As we have discussed previously, I think it would he would be a good idea for him to be seen in the valve clinic with Dr. Excell Seltzerooper and Dr. Cornelius Moraswen. He needs to know what his options are for management of aortic stenosis, and may be a good candidate for TAVR. Please make these arrangements by calling Darius Bumpyan Brooks at [phone]. Please also speak with the patient's daughter if possible so that she is aware of the situation. ------

## 2013-09-14 ENCOUNTER — Telehealth: Payer: Self-pay | Admitting: Nurse Practitioner

## 2013-09-14 NOTE — Telephone Encounter (Signed)
Called and spoke with patient's wife who verified that appointment with Dr. Excell Seltzerooper on 3/27 will work for them.

## 2013-09-25 ENCOUNTER — Ambulatory Visit (INDEPENDENT_AMBULATORY_CARE_PROVIDER_SITE_OTHER): Payer: Medicare PPO | Admitting: Cardiovascular Disease

## 2013-09-25 ENCOUNTER — Encounter: Payer: Self-pay | Admitting: Nurse Practitioner

## 2013-09-25 ENCOUNTER — Encounter: Payer: Self-pay | Admitting: Cardiovascular Disease

## 2013-09-25 VITALS — BP 152/68 | HR 57 | Ht 70.0 in | Wt 184.1 lb

## 2013-09-25 DIAGNOSIS — I350 Nonrheumatic aortic (valve) stenosis: Secondary | ICD-10-CM

## 2013-09-25 DIAGNOSIS — I359 Nonrheumatic aortic valve disorder, unspecified: Secondary | ICD-10-CM

## 2013-09-25 LAB — CBC WITH DIFFERENTIAL/PLATELET
BASOS PCT: 0.4 % (ref 0.0–3.0)
Basophils Absolute: 0 10*3/uL (ref 0.0–0.1)
EOS PCT: 4.7 % (ref 0.0–5.0)
Eosinophils Absolute: 0.3 10*3/uL (ref 0.0–0.7)
HCT: 38.2 % — ABNORMAL LOW (ref 39.0–52.0)
Hemoglobin: 12.9 g/dL — ABNORMAL LOW (ref 13.0–17.0)
LYMPHS PCT: 22.6 % (ref 12.0–46.0)
Lymphs Abs: 1.5 10*3/uL (ref 0.7–4.0)
MCHC: 33.7 g/dL (ref 30.0–36.0)
MCV: 93 fl (ref 78.0–100.0)
Monocytes Absolute: 0.6 10*3/uL (ref 0.1–1.0)
Monocytes Relative: 9.1 % (ref 3.0–12.0)
NEUTROS ABS: 4.2 10*3/uL (ref 1.4–7.7)
Neutrophils Relative %: 63.2 % (ref 43.0–77.0)
Platelets: 177 10*3/uL (ref 150.0–400.0)
RBC: 4.11 Mil/uL — AB (ref 4.22–5.81)
RDW: 13.6 % (ref 11.5–14.6)
WBC: 6.7 10*3/uL (ref 4.5–10.5)

## 2013-09-25 LAB — BASIC METABOLIC PANEL
BUN: 40 mg/dL — ABNORMAL HIGH (ref 6–23)
CALCIUM: 8.8 mg/dL (ref 8.4–10.5)
CO2: 23 mEq/L (ref 19–32)
Chloride: 112 mEq/L (ref 96–112)
Creatinine, Ser: 2.3 mg/dL — ABNORMAL HIGH (ref 0.4–1.5)
GFR: 29.34 mL/min — ABNORMAL LOW (ref >60.00)
Glucose, Bld: 95 mg/dL (ref 70–99)
Potassium: 5.4 mEq/L — ABNORMAL HIGH (ref 3.5–5.1)
SODIUM: 140 meq/L (ref 135–145)

## 2013-09-25 LAB — PROTIME-INR
INR: 1.1 ratio — ABNORMAL HIGH (ref 0.8–1.0)
Prothrombin Time: 11.9 s (ref 10.2–12.4)

## 2013-09-25 NOTE — Patient Instructions (Signed)
Your physician has requested that you have a cardiac catheterization. Cardiac catheterization is used to diagnose and/or treat various heart conditions. Doctors may recommend this procedure for a number of different reasons. The most common reason is to evaluate chest pain. Chest pain can be a symptom of coronary artery disease (CAD), and cardiac catheterization can show whether plaque is narrowing or blocking your heart's arteries. This procedure is also used to evaluate the valves, as well as measure the blood flow and oxygen levels in different parts of your heart. For further information please visit [email or web address]. Please follow instruction sheet, as given.  Your physician recommends that you have lab work today:  BMET, CBC, PT/INR

## 2013-09-26 ENCOUNTER — Encounter: Payer: Self-pay | Admitting: Cardiovascular Disease

## 2013-09-26 NOTE — Progress Notes (Signed)
TAVR CONSULT  HPI:  80 year-old gentleman referred by Dr Diona Browner for evaluation of severe symptomatic aortic stenosis. The patient's cardiac history dates back to 2004 when he presented with a NSTEMI and was found to have severe 3 vessel CAD. He underwent 4 vessel CABG with a LIMA-LAD, SVG-diagonal, SVG-OM1, and SVG-distal RCA. He's done very well until about one year ago when he began experiencing symptoms of progressive angina with exertion. He's also developed shortness of breath, faitgue. Denies lightheadedness or syncope. He has previously been active, but last year wasn't able to work in his garden because of anginal symptoms. Symptoms have progressed further over the last 1-2 months and he now describes an ache in his chest with walking less than one block. He otherwise remains functionally independent. He lives with his wife in [place], [place].  Outpatient Encounter Prescriptions as of 09/25/2013  Medication Sig  . acetaminophen (TYLENOL) 325 MG tablet Take 650 mg by mouth every 6 (six) hours as needed.    Marland Kitchen aspirin 81 MG tablet Take 81 mg by mouth daily.    . famotidine (PEPCID) 20 MG tablet Take 20 mg by mouth 2 (two) times daily as needed.    . hydrALAZINE (APRESOLINE) 10 MG tablet Take 1 tablet (10 mg total) by mouth 3 (three) times daily.  . isosorbide mononitrate (IMDUR) 30 MG 24 hr tablet Take one-half by mouth daily  . lisinopril (PRINIVIL,ZESTRIL) 40 MG tablet Take 1 tablet (40 mg total) by mouth daily.  . metoprolol (LOPRESSOR) 50 MG tablet Take 1 tablet (50 mg total) by mouth 2 (two) times daily.  . nitroGLYCERIN (NITROSTAT) 0.4 MG SL tablet Place 1 tablet (0.4 mg total) under the tongue every 5 (five) minutes as needed.  . Omega-3 Fatty Acids (FISH OIL) 1200 MG CAPS Take 1 capsule by mouth 3 (three) times daily.    . simvastatin (ZOCOR) 40 MG tablet Take 1 tablet (40 mg total) by mouth every evening.    Lipitor  Past Medical History  Diagnosis Date  . Coronary  atherosclerosis of native coronary artery     Multivessel, LVEF 65%  . Mixed hyperlipidemia   . Essential hypertension, benign   . Myocardial infarction   . Renal insufficiency   . Arthritis   . Aortic stenosis     Past Surgical History  Procedure Laterality Date  . Umbilical hernia repair    . Appendectomy    . Left total knee arthroplasty    . Coronary artery bypass graft      LIMA to LAD, SVG to OM, SVG to RCA, SVG to diagonal 11/04  . Cataract extraction Left 04/27/13    Dr. Lita Mains    History   Social History  . Marital Status: Married    Spouse Name: N/A    Number of Children: N/A  . Years of Education: N/A   Occupational History  . Not on file.   Social History Main Topics  . Smoking status: Never Smoker   . Smokeless tobacco: Never Used     Comment: tobacco use - no  . Alcohol Use: No  . Drug Use: No  . Sexual Activity: Not on file   Other Topics Concern  . Not on file   Social History Narrative   Married.     Family Hx: no premature CAD  ROS:  General: no fevers/chills/night sweats, positive for generalized fatigue Eyes: no blurry vision, diplopia, or amaurosis ENT: no sore throat or hearing loss Resp: no cough,  wheezing, or hemoptysis CV:  GI: no abdominal pain, nausea, vomiting, diarrhea, or constipation GU: no dysuria, frequency, or hematuria Skin: no rash Neuro: no headache, numbness, tingling, or weakness of extremities Musculoskeletal: mild knee pain (bilateral TKA), no hip pain Heme: no bleeding, DVT, or easy bruising Endo: no polydipsia or polyuria  BP 152/68  Pulse 57  Ht 5\' 10"  (1.778 m)  Wt 184 lb 1.9 oz (83.516 kg)  BMI 26.42 kg/m2  PHYSICAL EXAM: Pt is alert and oriented, WD, WN, in no distress. HEENT: normal Neck: JVP normal. Carotid upstrokes normal without bruits. No thyromegaly. Lungs: equal expansion, clear bilaterally CV: Apex is discrete and nondisplaced, RRR without murmur or gallop Abd: soft, NT, +BS, no bruit,  no hepatosplenomegaly Back: no CVA tenderness Ext: no C/C/E        Femoral pulses 2+= without bruits        DP/PT pulses intact and = Skin: warm and dry without rash Neuro: CNII-XII intact             Strength intact = bilaterally  EKG:  Sinus brady 57 bpm, Early precordial R-wave transition.  2D Echo: ------------------------------------------------------------ Study Conclusions  - Left ventricle: The cavity size was normal. Wall thickness was increased in a pattern of mild LVH. Systolic function was vigorous. The estimated ejection fraction was in the range of 65% to 70%. Wall motion was normal; there were no regional wall motion abnormalities. Doppler parameters are consistent with abnormal left ventricular relaxation (grade 1 diastolic dysfunction). - Aortic valve: Moderately calcified annulus. Trileaflet; moderately thickened, moderately calcified leaflets. Cusp separation was severely reduced. There was severe stenosis. Mild regurgitation. Mean gradient: 43mm Hg (S). Peak gradient: 70mm Hg (S). VTI ratio of LVOT to aortic valve: 0.29. Valve area: 0.92cm^2(VTI). Valve area: 0.82cm^2 (Vmax). - Mitral valve: Calcified annulus. Mildly thickened leaflets . Mild regurgitation. - Left atrium: The atrium was severely dilated. - Tricuspid valve: Trivial regurgitation. Peak RV-RA gradient: 27mm Hg (S). - Inferior vena cava: Not visualized. Unable to estimate CVP. - Pericardium, extracardiac: There was no pericardial effusion. Impressions:  - Mild LVH with LVEF 65-70%, grade 1 diastolic dysfunction. Severe left atrial enlargement. MAC with mild mitral regurgitation. Severe calcific aortic stenosis with mild aortic regurgitation as outlined above - gradients have increased compared to the prior study. Trivial tricuspid regurgitation with RV-RA gradient 27 mmHg.  STS RISK SCORES Procedure: AV Replacement Risk of Mortality: 4.969% Morbidity or Mortality: 32.447% Long Length  of Stay: 13.536% Short Length of Stay: 19.242% Permanent Stroke: 2.792% Prolonged Ventilation: 21.502% DSW Infection: 0.184% Renal Failure: 11.725% Reoperation: 10.729%    ASSESSMENT AND PLAN: 80 year-old male with CCS Class 3 Angina, known CAD s/p CABG, and severe aortic stenosis. He has a hx of bilateral knee replacement but he is only limited by cardiopulmonary sx's. In addition to his known severe AS, I suspect he has developed significant SVG disease since angina is such a prominent symptom. We discussed the natural history of severe symptomatic aortic stenosis and associated poor prognosis with medical therapy. We reviewed further diagnostic testing and treatment options. We contrasted treatment options of palliative medical therapy, redo cardiac surgery, and transcatheter aortic valve replacement. While his STS predicted mortality risk is less than 8%, his history of CABG, presence of CKD, and age 80 make TAVR a reasonable treatment consideration.   He understands that cardiac catheterization is required to assess hemodynamics and coronary/bypass graft anatomy. I have reviewed the risks, indications, and alternatives to right and left heart  catheterization and he understands and agrees to proceed. His risk of contrast nephropathy is increased in the setting of significant CKD with creatinine 2.3 mg/dL. Will ask him to hold lisinopril 48 hours prior to cath and also will bring him in several hours before angiography for pre-hydration. Further plans pending cath results. Regardless of findings, he will require follow-up evaluation in the multidisciplinary heart valve clinic.  Time spent in direct in consultation 60 minutes, greater than 50% of which was in discussion with patient and family.  Tonny BollmanMichael Zakyla Tonche 09/26/2013 11:17 PM

## 2013-09-29 ENCOUNTER — Other Ambulatory Visit: Payer: Self-pay | Admitting: *Deleted

## 2013-09-29 MED ORDER — METOPROLOL TARTRATE 50 MG PO TABS: 50.0000 mg | ORAL_TABLET | Freq: Two times a day (BID) | ORAL | Status: DC

## 2013-09-29 MED ORDER — ISOSORBIDE MONONITRATE ER 30 MG PO TB24: ORAL_TABLET | ORAL | Status: DC

## 2013-09-29 MED ORDER — LISINOPRIL 40 MG PO TABS: 40.0000 mg | ORAL_TABLET | Freq: Every day | ORAL | Status: DC

## 2013-09-30 ENCOUNTER — Encounter (HOSPITAL_COMMUNITY): Payer: Self-pay | Admitting: Pharmacy Technician

## 2013-09-30 ENCOUNTER — Telehealth: Payer: Self-pay | Admitting: Nurse Practitioner

## 2013-09-30 NOTE — Telephone Encounter (Signed)
Per Dr. Excell Seltzerooper - let's go ahead and have him stop the ACE until after his cath is completed.  Called and spoke with patient and his wife and advised patient to hold Lisinopril until after heart cath on 4/14.  Patient states he is hard of hearing and asked me to give instructions to his wife.  Patient's wife verbalized understanding and agreement.  I advised her that I have mailed a copy of patient's pre-procedure instructions and to call with questions or concerns.

## 2013-10-01 ENCOUNTER — Telehealth: Payer: Self-pay | Admitting: Cardiovascular Disease

## 2013-10-01 NOTE — Telephone Encounter (Signed)
New message    Daughter is asking for clarification on message that was rely to her father from [place] on yesterday to stop lisinopril.   patient is [name]

## 2013-10-01 NOTE — Telephone Encounter (Signed)
Pam pt's daughter aware that pt is to hold Lisinopril until after the cardiac cath on 10/13/13.

## 2013-10-13 ENCOUNTER — Ambulatory Visit (HOSPITAL_COMMUNITY)
Admission: RE | Admit: 2013-10-13 | Discharge: 2013-10-13 | Disposition: A | Discharge status: Home / Self Care | Payer: Medicare PPO | Source: Ambulatory Visit | Attending: Cardiovascular Disease | Admitting: Cardiovascular Disease

## 2013-10-13 ENCOUNTER — Encounter (HOSPITAL_COMMUNITY)
Admission: RE | Disposition: A | Discharge status: Home / Self Care | Payer: Self-pay | Source: Ambulatory Visit | Attending: Cardiovascular Disease

## 2013-10-13 DIAGNOSIS — I359 Nonrheumatic aortic valve disorder, unspecified: Secondary | ICD-10-CM

## 2013-10-13 DIAGNOSIS — I252 Old myocardial infarction: Secondary | ICD-10-CM | POA: Insufficient documentation

## 2013-10-13 DIAGNOSIS — Z7982 Long term (current) use of aspirin: Secondary | ICD-10-CM | POA: Insufficient documentation

## 2013-10-13 DIAGNOSIS — N189 Chronic kidney disease, unspecified: Secondary | ICD-10-CM | POA: Insufficient documentation

## 2013-10-13 DIAGNOSIS — I209 Angina pectoris, unspecified: Secondary | ICD-10-CM | POA: Insufficient documentation

## 2013-10-13 DIAGNOSIS — E782 Mixed hyperlipidemia: Secondary | ICD-10-CM | POA: Insufficient documentation

## 2013-10-13 DIAGNOSIS — I2510 Atherosclerotic heart disease of native coronary artery without angina pectoris: Secondary | ICD-10-CM

## 2013-10-13 DIAGNOSIS — I129 Hypertensive chronic kidney disease with stage 1 through stage 4 chronic kidney disease, or unspecified chronic kidney disease: Secondary | ICD-10-CM | POA: Insufficient documentation

## 2013-10-13 DIAGNOSIS — Z96659 Presence of unspecified artificial knee joint: Secondary | ICD-10-CM | POA: Insufficient documentation

## 2013-10-13 HISTORY — PX: LEFT AND RIGHT HEART CATHETERIZATION WITH CORONARY/GRAFT ANGIOGRAM: SHX5448

## 2013-10-13 LAB — POCT I-STAT 3, VENOUS BLOOD GAS (G3P V)
Acid-base deficit: 8 mmol/L — ABNORMAL HIGH (ref 0.0–2.0)
Bicarbonate: 19.1 mEq/L — ABNORMAL LOW (ref 20.0–24.0)
O2 Saturation: 64 %
PCO2 VEN: 42.9 mmHg — AB (ref 45.0–50.0)
TCO2: 20 mmol/L (ref 0–100)
pH, Ven: 7.256 (ref 7.250–7.300)
pO2, Ven: 38 mmHg (ref 30.0–45.0)

## 2013-10-13 LAB — PROTIME-INR
INR: 1.05 (ref 0.00–1.49)
Prothrombin Time: 13.5 seconds (ref 11.6–15.2)

## 2013-10-13 LAB — BASIC METABOLIC PANEL
BUN: 44 mg/dL — ABNORMAL HIGH (ref 6–23)
CO2: 19 meq/L (ref 19–32)
Calcium: 8.7 mg/dL (ref 8.4–10.5)
Chloride: 107 mEq/L (ref 96–112)
Creatinine, Ser: 1.7 mg/dL — ABNORMAL HIGH (ref 0.50–1.35)
GFR calc Af Amer: 41 mL/min — ABNORMAL LOW (ref >90)
GFR calc non Af Amer: 36 mL/min — ABNORMAL LOW (ref >90)
Glucose, Bld: 101 mg/dL — ABNORMAL HIGH (ref 70–99)
Potassium: 4.8 mEq/L (ref 3.7–5.3)
SODIUM: 140 meq/L (ref 137–147)

## 2013-10-13 LAB — CBC
HEMATOCRIT: 37.9 % — AB (ref 39.0–52.0)
Hemoglobin: 12.9 g/dL — ABNORMAL LOW (ref 13.0–17.0)
MCH: 31 pg (ref 26.0–34.0)
MCHC: 34 g/dL (ref 30.0–36.0)
MCV: 91.1 fL (ref 78.0–100.0)
Platelets: 139 10*3/uL — ABNORMAL LOW (ref 150–400)
RBC: 4.16 MIL/uL — AB (ref 4.22–5.81)
RDW: 13.1 % (ref 11.5–15.5)
WBC: 8.3 10*3/uL (ref 4.0–10.5)

## 2013-10-13 LAB — POCT I-STAT 3, ART BLOOD GAS (G3+)
Acid-base deficit: 6 mmol/L — ABNORMAL HIGH (ref 0.0–2.0)
Bicarbonate: 20.2 mEq/L (ref 20.0–24.0)
O2 SAT: 90 %
PCO2 ART: 41 mmHg (ref 35.0–45.0)
PO2 ART: 64 mmHg — AB (ref 80.0–100.0)
TCO2: 21 mmol/L (ref 0–100)
pH, Arterial: 7.3 — ABNORMAL LOW (ref 7.350–7.450)

## 2013-10-13 SURGERY — LEFT AND RIGHT HEART CATHETERIZATION WITH CORONARY/GRAFT ANGIOGRAM
Anesthesia: LOCAL

## 2013-10-13 MED ORDER — ASPIRIN 81 MG PO CHEW: 81.0000 mg | CHEWABLE_TABLET | ORAL | Status: DC

## 2013-10-13 MED ORDER — SODIUM CHLORIDE 0.9 % IJ SOLN: 3.0000 mL | INTRAMUSCULAR | Status: DC | PRN

## 2013-10-13 MED ORDER — SODIUM CHLORIDE 0.9 % IV SOLN: 250.0000 mL | INTRAVENOUS | Status: DC | PRN

## 2013-10-13 MED ORDER — LIDOCAINE HCL (PF) 1 % IJ SOLN
INTRAMUSCULAR | Status: AC
  Filled 2013-10-13: qty 30

## 2013-10-13 MED ORDER — ATROPINE SULFATE 0.1 MG/ML IJ SOLN
INTRAMUSCULAR | Status: AC
  Filled 2013-10-13: qty 10

## 2013-10-13 MED ORDER — HYDRALAZINE HCL 20 MG/ML IJ SOLN
10.0000 mg | Freq: Once | INTRAMUSCULAR | Status: AC
  Administered 2013-10-13: 10 mg via INTRAVENOUS

## 2013-10-13 MED ORDER — LABETALOL HCL 5 MG/ML IV SOLN
20.0000 mg | Freq: Once | INTRAVENOUS | Status: AC
  Administered 2013-10-13: 10 mg via INTRAVENOUS
  Filled 2013-10-13: qty 4

## 2013-10-13 MED ORDER — HYDRALAZINE HCL 20 MG/ML IJ SOLN
INTRAMUSCULAR | Status: AC
  Filled 2013-10-13: qty 1

## 2013-10-13 MED ORDER — FENTANYL CITRATE 0.05 MG/ML IJ SOLN
INTRAMUSCULAR | Status: AC
  Filled 2013-10-13: qty 2

## 2013-10-13 MED ORDER — SODIUM CHLORIDE 0.9 % IV SOLN
1.0000 mL/kg/h | INTRAVENOUS | Status: DC
Start: 2013-10-13 — End: 2013-10-13

## 2013-10-13 MED ORDER — ONDANSETRON HCL 4 MG/2ML IJ SOLN: 4.0000 mg | Freq: Four times a day (QID) | INTRAMUSCULAR | Status: DC | PRN

## 2013-10-13 MED ORDER — MIDAZOLAM HCL 2 MG/2ML IJ SOLN
INTRAMUSCULAR | Status: AC
  Filled 2013-10-13: qty 2

## 2013-10-13 MED ORDER — HEPARIN (PORCINE) IN NACL 2-0.9 UNIT/ML-% IJ SOLN
INTRAMUSCULAR | Status: AC
  Filled 2013-10-13: qty 1000

## 2013-10-13 MED ORDER — SODIUM CHLORIDE 0.9 % IV SOLN
INTRAVENOUS | Status: DC
  Administered 2013-10-13: 09:00:00 via INTRAVENOUS

## 2013-10-13 MED ORDER — NITROGLYCERIN 0.2 MG/ML ON CALL CATH LAB
INTRAVENOUS | Status: AC
  Filled 2013-10-13: qty 1

## 2013-10-13 MED ORDER — SODIUM CHLORIDE 0.9 % IJ SOLN: 3.0000 mL | Freq: Two times a day (BID) | INTRAMUSCULAR | Status: DC

## 2013-10-13 MED ORDER — ACETAMINOPHEN 325 MG PO TABS: 650.0000 mg | ORAL_TABLET | ORAL | Status: DC | PRN

## 2013-10-13 MED ORDER — OXYCODONE-ACETAMINOPHEN 5-325 MG PO TABS: 1.0000 | ORAL_TABLET | ORAL | Status: DC | PRN

## 2013-10-13 NOTE — H&P (View-Only) (Signed)
TAVR CONSULT  HPI:  80 year-old gentleman referred by Dr Diona Browner for evaluation of severe symptomatic aortic stenosis. The patient's cardiac history dates back to 2004 when he presented with a NSTEMI and was found to have severe 3 vessel CAD. He underwent 4 vessel CABG with a LIMA-LAD, SVG-diagonal, SVG-OM1, and SVG-distal RCA. He's done very well until about one year ago when he began experiencing symptoms of progressive angina with exertion. He's also developed shortness of breath, faitgue. Denies lightheadedness or syncope. He has previously been active, but last year wasn't able to work in his garden because of anginal symptoms. Symptoms have progressed further over the last 1-2 months and he now describes an ache in his chest with walking less than one block. He otherwise remains functionally independent. He lives with his wife in [place], [place].  Outpatient Encounter Prescriptions as of 09/25/2013  Medication Sig  . acetaminophen (TYLENOL) 325 MG tablet Take 650 mg by mouth every 6 (six) hours as needed.    Marland Kitchen aspirin 81 MG tablet Take 81 mg by mouth daily.    . famotidine (PEPCID) 20 MG tablet Take 20 mg by mouth 2 (two) times daily as needed.    . hydrALAZINE (APRESOLINE) 10 MG tablet Take 1 tablet (10 mg total) by mouth 3 (three) times daily.  . isosorbide mononitrate (IMDUR) 30 MG 24 hr tablet Take one-half by mouth daily  . lisinopril (PRINIVIL,ZESTRIL) 40 MG tablet Take 1 tablet (40 mg total) by mouth daily.  . metoprolol (LOPRESSOR) 50 MG tablet Take 1 tablet (50 mg total) by mouth 2 (two) times daily.  . nitroGLYCERIN (NITROSTAT) 0.4 MG SL tablet Place 1 tablet (0.4 mg total) under the tongue every 5 (five) minutes as needed.  . Omega-3 Fatty Acids (FISH OIL) 1200 MG CAPS Take 1 capsule by mouth 3 (three) times daily.    . simvastatin (ZOCOR) 40 MG tablet Take 1 tablet (40 mg total) by mouth every evening.    Lipitor  Past Medical History  Diagnosis Date  . Coronary  atherosclerosis of native coronary artery     Multivessel, LVEF 65%  . Mixed hyperlipidemia   . Essential hypertension, benign   . Myocardial infarction   . Renal insufficiency   . Arthritis   . Aortic stenosis     Past Surgical History  Procedure Laterality Date  . Umbilical hernia repair    . Appendectomy    . Left total knee arthroplasty    . Coronary artery bypass graft      LIMA to LAD, SVG to OM, SVG to RCA, SVG to diagonal 11/04  . Cataract extraction Left 04/27/13    Dr. Lita Mains    History   Social History  . Marital Status: Married    Spouse Name: N/A    Number of Children: N/A  . Years of Education: N/A   Occupational History  . Not on file.   Social History Main Topics  . Smoking status: Never Smoker   . Smokeless tobacco: Never Used     Comment: tobacco use - no  . Alcohol Use: No  . Drug Use: No  . Sexual Activity: Not on file   Other Topics Concern  . Not on file   Social History Narrative   Married.     Family Hx: no premature CAD  ROS:  General: no fevers/chills/night sweats, positive for generalized fatigue Eyes: no blurry vision, diplopia, or amaurosis ENT: no sore throat or hearing loss Resp: no cough,  wheezing, or hemoptysis CV:  GI: no abdominal pain, nausea, vomiting, diarrhea, or constipation GU: no dysuria, frequency, or hematuria Skin: no rash Neuro: no headache, numbness, tingling, or weakness of extremities Musculoskeletal: mild knee pain (bilateral TKA), no hip pain Heme: no bleeding, DVT, or easy bruising Endo: no polydipsia or polyuria  BP 152/68  Pulse 57  Ht 5\' 10"  (1.778 m)  Wt 184 lb 1.9 oz (83.516 kg)  BMI 26.42 kg/m2  PHYSICAL EXAM: Pt is alert and oriented, WD, WN, in no distress. HEENT: normal Neck: JVP normal. Carotid upstrokes normal without bruits. No thyromegaly. Lungs: equal expansion, clear bilaterally CV: Apex is discrete and nondisplaced, RRR without murmur or gallop Abd: soft, NT, +BS, no bruit,  no hepatosplenomegaly Back: no CVA tenderness Ext: no C/C/E        Femoral pulses 2+= without bruits        DP/PT pulses intact and = Skin: warm and dry without rash Neuro: CNII-XII intact             Strength intact = bilaterally  EKG:  Sinus brady 57 bpm, Early precordial R-wave transition.  2D Echo: ------------------------------------------------------------ Study Conclusions  - Left ventricle: The cavity size was normal. Wall thickness was increased in a pattern of mild LVH. Systolic function was vigorous. The estimated ejection fraction was in the range of 65% to 70%. Wall motion was normal; there were no regional wall motion abnormalities. Doppler parameters are consistent with abnormal left ventricular relaxation (grade 1 diastolic dysfunction). - Aortic valve: Moderately calcified annulus. Trileaflet; moderately thickened, moderately calcified leaflets. Cusp separation was severely reduced. There was severe stenosis. Mild regurgitation. Mean gradient: 43mm Hg (S). Peak gradient: 70mm Hg (S). VTI ratio of LVOT to aortic valve: 0.29. Valve area: 0.92cm^2(VTI). Valve area: 0.82cm^2 (Vmax). - Mitral valve: Calcified annulus. Mildly thickened leaflets . Mild regurgitation. - Left atrium: The atrium was severely dilated. - Tricuspid valve: Trivial regurgitation. Peak RV-RA gradient: 27mm Hg (S). - Inferior vena cava: Not visualized. Unable to estimate CVP. - Pericardium, extracardiac: There was no pericardial effusion. Impressions:  - Mild LVH with LVEF 65-70%, grade 1 diastolic dysfunction. Severe left atrial enlargement. MAC with mild mitral regurgitation. Severe calcific aortic stenosis with mild aortic regurgitation as outlined above - gradients have increased compared to the prior study. Trivial tricuspid regurgitation with RV-RA gradient 27 mmHg.  STS RISK SCORES Procedure: AV Replacement Risk of Mortality: 4.969% Morbidity or Mortality: 32.447% Long Length  of Stay: 13.536% Short Length of Stay: 19.242% Permanent Stroke: 2.792% Prolonged Ventilation: 21.502% DSW Infection: 0.184% Renal Failure: 11.725% Reoperation: 10.729%    ASSESSMENT AND PLAN: 80 year-old male with CCS Class 3 Angina, known CAD s/p CABG, and severe aortic stenosis. He has a hx of bilateral knee replacement but he is only limited by cardiopulmonary sx's. In addition to his known severe AS, I suspect he has developed significant SVG disease since angina is such a prominent symptom. We discussed the natural history of severe symptomatic aortic stenosis and associated poor prognosis with medical therapy. We reviewed further diagnostic testing and treatment options. We contrasted treatment options of palliative medical therapy, redo cardiac surgery, and transcatheter aortic valve replacement. While his STS predicted mortality risk is less than 8%, his history of CABG, presence of CKD, and age 80 make TAVR a reasonable treatment consideration.   He understands that cardiac catheterization is required to assess hemodynamics and coronary/bypass graft anatomy. I have reviewed the risks, indications, and alternatives to right and left heart  catheterization and he understands and agrees to proceed. His risk of contrast nephropathy is increased in the setting of significant CKD with creatinine 2.3 mg/dL. Will ask him to hold lisinopril 48 hours prior to cath and also will bring him in several hours before angiography for pre-hydration. Further plans pending cath results. Regardless of findings, he will require follow-up evaluation in the multidisciplinary heart valve clinic.  Time spent in direct in consultation 60 minutes, greater than 50% of which was in discussion with patient and family.  Tonny BollmanMichael Isma Tietje 09/26/2013 11:17 PM

## 2013-10-13 NOTE — Progress Notes (Signed)
Site area: right groin  Site Prior to Removal:  0Level 0  Pressure Applied For 25 MINUTES    Minutes Beginning at 1450  Manual:   yes  Patient Status During Pull:  stable  Post Pull Groin Site:  Level 0  Post Pull Instructions Given:  yes  Post Pull Pulses Present:  yes  Dressing Applied:  yes  Comments:  Both venous and arterial sites unremarkable

## 2013-10-13 NOTE — CV Procedure (Signed)
Cardiac Catheterization Procedure Note  Name: [name] MRN: [id number] DOB: [birth year]  Procedure: Right Heart Cath, Left Heart Cath, Selective Coronary Angiography, LV angiography  Indication: Severe aortic stenosis, progressive symptoms of dyspnea and exertional angina (functional class III)  Procedural Details: The right groin was prepped, draped, and anesthetized with 1% lidocaine. Using the modified Seldinger technique a 5 French sheath was placed in the right femoral artery and a 7 French sheath was placed in the right femoral vein. A Swan-Ganz catheter was used for the right heart catheterization. Standard protocol was followed for recording of right heart pressures and sampling of oxygen saturations. Fick cardiac output was calculated. Standard Judkins catheters were used for selective coronary angiography. Ventriculography was deferred because of chronic kidney disease. A straight tip wire was used to cross the aortic valve. A JR 4 catheter was used for saphenous vein graft angiography. LIMA catheter was used for LIMA angiography. There were no immediate procedural complications. The patient was transferred to the post catheterization recovery area for further monitoring.  Procedural Findings: Hemodynamics RA mean of 3 RV 56/6 PA 53/11 with a mean of 26 PCWP A wave 13, V wave 9, mean of 9 LV 233/30 AO 193/70 with a mean of 118  Oxygen saturations: PA 64 AO 90  Cardiac Output (Fick) 5.9  Cardiac Index (Fick) 2.9  Aortic valve hemodynamics:  Peak to peak gradient 40 mm mercury, mean gradient 32 mm mercury, aortic valve area 1.0 cm   Coronary angiography: Coronary dominance: right  Left mainstem: Patent without obstructive disease. Arises from the left cusp it divides into the LAD and left circumflex.  Left anterior descending (LAD): The LAD is patent with 90% proximal stenosis just before the first diagonal branch. The distal vessel and first diagonal both fill  from competitive graft flow.  Left circumflex (LCx): The left circumflex is patent. The vessel supplies a first obtuse marginal without significant disease. The first OM is tented up at the site of previous vein graft anastomosis. There is a small intermediate branch without significant disease.  Right coronary artery (RCA): The native RCA was not selectively injected. The vessel fills from the saphenous vein graft. The vessel fills retrograde and is occluded.  Saphenous vein graft to distal RCA. The vein graft is patent. There is an irregular plaque with no more than 30-40% stenosis in the proximal graft. The PDA and PLA branches are patent.  Saphenous vein graft to OM: 100% occlusion at the proximal anastomosis  Saphenous vein graft to first diagonal: Widely patent without significant stenosis. There is extensive filling of the left coronary artery via antegrade and retrograde graft flow.  LIMA to LAD: Widely patent throughout its course. The coronary anastomotic site is widely patent. The LAD has no significant distal vessel disease beyond the LIMA insertion site.  Aortic valve under fluoroscopy is severely calcified with restricted mobility.  Left ventriculography: Deferred because of chronic kidney disease  Final Conclusions:   1. Severe double vessel coronary artery disease with severe stenosis of the proximal LAD and total occlusion of the native RCA 2. Patency of the left circumflex without significant stenosis. 3. Status post aortocoronary bypass surgery with continued patency of the LIMA to LAD, saphenous vein graft to diagonal, and saphenous vein graft to distal RCA 4. Total occlusion of the saphenous vein graft to obtuse marginal 5. Severe aortic stenosis  Recommendations: Will refer to cardiac surgery for consideration of treatment options for severe symptomatic aortic stenosis (TAVR versus  redo sternotomy/SARV).  Craig BollmanMichael Jesaiah Hayes 10/13/2013, 1:59 PM

## 2013-10-13 NOTE — Interval H&P Note (Signed)
History and Physical Interval Note:  10/13/2013 1:03 PM  Allayne GitelmanJames H Zucker  has presented today for surgery, with the diagnosis of Severe Aortic Stenosis  The various methods of treatment have been discussed with the patient and family. After consideration of risks, benefits and other options for treatment, the patient has consented to  Procedure(s): LEFT AND RIGHT HEART CATHETERIZATION WITH CORONARY/GRAFT ANGIOGRAM (N/A) as a surgical intervention .  The patient's history has been reviewed, patient examined, no change in status, stable for surgery.  I have reviewed the patient's chart and labs.  Questions were answered to the patient's satisfaction.    Cath Lab Visit (complete for each Cath Lab visit)  Clinical Evaluation Leading to the Procedure:   ACS: no  Non-ACS:    Anginal Classification: CCS III  Anti-ischemic medical therapy: Maximal Therapy (2 or more classes of medications)  Non-Invasive Test Results: No non-invasive testing performed  Prior CABG: Previous CABG       Tonny BollmanMichael Cheikh Bramble

## 2013-10-13 NOTE — Discharge Instructions (Signed)
Angiography, Care After °Refer to this sheet in the next few weeks. These instructions provide you with information on caring for yourself after your procedure. Your health care provider may also give you more specific instructions. Your treatment has been planned according to current medical practices, but problems sometimes occur. Call your health care provider if you have any problems or questions after your procedure.  °WHAT TO EXPECT AFTER THE PROCEDURE °After your procedure, it is typical to have the following sensations: °· Minor discomfort or tenderness and a small bump at the catheter insertion site. The bump should usually decrease in size and tenderness within 1 to 2 weeks. °· Any bruising will usually fade within 2 to 4 weeks. °HOME CARE INSTRUCTIONS  °· You may need to keep taking blood thinners if they were prescribed for you. Only take over-the-counter or prescription medicines for pain, fever, or discomfort as directed by your health care provider. °· Do not apply powder or lotion to the site. °· Do not sit in a bathtub, swimming pool, or whirlpool for 5 to 7 days. °· You may shower 24 hours after the procedure. Remove the bandage (dressing) and gently wash the site with plain soap and water. Gently pat the site dry. °· Inspect the site at least twice daily. °· Limit your activity for the first 48 hours. Do not bend, squat, or lift anything over 20 lb (9 kg) or as directed by your health care provider. °· Do not drive home if you are discharged the day of the procedure. Have someone else drive you. Follow instructions about when you can drive or return to work. °SEEK MEDICAL CARE IF: °· You get lightheaded when standing up. °· You have drainage (other than a small amount of blood on the dressing). °· You have chills. °· You have a fever. °· You have redness, warmth, swelling, or pain at the insertion site. °SEEK IMMEDIATE MEDICAL CARE IF:  °· You develop chest pain or shortness of breath, feel faint,  or pass out. °· You have bleeding, swelling larger than a walnut, or drainage from the catheter insertion site. °· You develop pain, discoloration, coldness, or severe bruising in the leg or arm that held the catheter. °· You develop bleeding from any other place, such as the bowels. You may see bright red blood in your urine or stools, or your stools may appear black and tarry. °· You have heavy bleeding from the site. If this happens, hold pressure on the site. °MAKE SURE YOU: °· Understand these instructions. °· Will watch your condition. °· Will get help right away if you are not doing well or get worse. °Document Released: 01/04/2005 Document Revised: 02/18/2013 Document Reviewed: 11/10/2012 °[employer]® Patient Information ©2014 [employer]. ° °

## 2013-10-13 NOTE — Progress Notes (Signed)
Dr Excell Seltzerooper notified of pt's BP of 180-213/60-80.  Order received for med before sheath pull which was given.

## 2013-10-14 ENCOUNTER — Institutional Professional Consult (permissible substitution): Payer: Medicare PPO | Admitting: Cardiovascular Disease

## 2013-10-16 ENCOUNTER — Encounter: Payer: Self-pay | Admitting: Thoracic Surgery (Cardiothoracic Vascular Surgery)

## 2013-10-16 ENCOUNTER — Institutional Professional Consult (permissible substitution) (INDEPENDENT_AMBULATORY_CARE_PROVIDER_SITE_OTHER): Payer: Medicare PPO | Admitting: Thoracic Surgery (Cardiothoracic Vascular Surgery)

## 2013-10-16 VITALS — BP 129/62 | HR 57 | Resp 20 | Ht 70.0 in | Wt 185.0 lb

## 2013-10-16 DIAGNOSIS — Z951 Presence of aortocoronary bypass graft: Secondary | ICD-10-CM

## 2013-10-16 DIAGNOSIS — I359 Nonrheumatic aortic valve disorder, unspecified: Secondary | ICD-10-CM

## 2013-10-16 DIAGNOSIS — I350 Nonrheumatic aortic (valve) stenosis: Secondary | ICD-10-CM

## 2013-10-16 NOTE — Progress Notes (Signed)
PCP is HASANAJ,XAJE A, MD Referring Provider is Toma DeitersHasanaj, Xaje A, MD  Chief Complaint  Patient presents with  . Aortic Stenosis    Surgical eval, Cardiac Cath 10/13/2013, ECHO 09/10/2013    HPI: [name] is an 80 year old gentleman sent for evaluation for aortic stenosis.  [name] is an 80 year old gentleman with a history of coronary artery disease. He underwent coronary bypass grafting x4 in 2004. He has been followed for moderate aortic stenosis over time. He has been having progressively more chest pain and tightness with exertion over the past several months. Recently an echo showed significant progression of his aortic stenosis.  He was seen by Dr. Excell Seltzerooper. He underwent cardiac catheterization which showed no coronary source for his symptoms. He now is referred for consideration for redo sternotomy for aortic valve replacement.  [name] is extremely hard of hearing and is a poor historian. His wife provides much of the information. He is not very physically active. That he has been having chest tightness with activity recently. He also complains of a "gassy" feeling in his chest when sleeping at night. This is not the same sensation that he gets with exertion and is not associated with shortness of breath.   Past Medical History  Diagnosis Date  . Coronary atherosclerosis of native coronary artery     Multivessel, LVEF 65%  . Mixed hyperlipidemia   . Essential hypertension, benign   . Myocardial infarction   . Renal insufficiency   . Arthritis   . Aortic stenosis     Past Surgical History  Procedure Laterality Date  . Umbilical hernia repair    . Appendectomy    . Left total knee arthroplasty    . Coronary artery bypass graft      LIMA to LAD, SVG to OM, SVG to RCA, SVG to diagonal 11/04  . Cataract extraction Left 04/27/13    Dr. Lita MainsHaines    No family history on file.  Social History History  Substance Use Topics  . Smoking status: Never Smoker   . Smokeless  tobacco: Never Used     Comment: tobacco use - no  . Alcohol Use: No    Current Outpatient Prescriptions  Medication Sig Dispense Refill  . acetaminophen (TYLENOL) 325 MG tablet Take 650 mg by mouth every 6 (six) hours as needed for mild pain.       Marland Kitchen. aspirin 81 MG tablet Take 81 mg by mouth daily.        . famotidine (PEPCID) 20 MG tablet Take 20 mg by mouth 2 (two) times daily as needed for heartburn.       . hydrALAZINE (APRESOLINE) 10 MG tablet Take 1 tablet (10 mg total) by mouth 3 (three) times daily.  270 tablet  3  . isosorbide mononitrate (IMDUR) 30 MG 24 hr tablet Take one-half by mouth daily  45 tablet  3  . metoprolol (LOPRESSOR) 50 MG tablet Take 1 tablet (50 mg total) by mouth 2 (two) times daily.  180 tablet  3  . nitroGLYCERIN (NITROSTAT) 0.4 MG SL tablet Place 1 tablet (0.4 mg total) under the tongue every 5 (five) minutes as needed.  25 tablet  3  . Omega-3 Fatty Acids (FISH OIL) 1200 MG CAPS Take 1 capsule by mouth 3 (three) times daily.        . simvastatin (ZOCOR) 40 MG tablet Take 1 tablet (40 mg total) by mouth every evening.  30 tablet  6   No current facility-administered medications  for this visit.    Allergies  Allergen Reactions  . Lipitor [Atorvastatin Calcium]     Leg pain    Review of Systems  HENT: Positive for hearing loss.   Respiratory: Positive for chest tightness and shortness of breath.   Cardiovascular: Positive for chest pain.  Gastrointestinal:       Difficulty swallowing  Genitourinary: Positive for frequency.  Musculoskeletal: Positive for arthralgias.       Leg cramps  Neurological:       Ocular stroke  All other systems reviewed and are negative.   BP 129/62  Pulse 57  Resp 20  Ht 5\' 10"  (1.778 m)  Wt 185 lb (83.915 kg)  BMI 26.54 kg/m2  SpO2 96% Physical Exam  Vitals reviewed. Constitutional: He is oriented to person, place, and time. He appears well-developed and well-nourished.  Neck: Neck supple. No thyromegaly  present.  Bilateral bruits v transmitted murmur  Cardiovascular: Normal rate and regular rhythm.   Murmur (3/6 systolic) heard. Pulmonary/Chest: Effort normal and breath sounds normal.  Abdominal: Soft. There is no tenderness.  Lymphadenopathy:    He has no cervical adenopathy.  Neurological: He is alert and oriented to person, place, and time.  extremely hard of hearing, no focal motor deficits  Skin: Skin is warm and dry.     Diagnostic Tests: Echocardiogram 09/10/2013  Study Conclusions  - Left ventricle: The cavity size was normal. Wall thickness was increased in a pattern of mild LVH. Systolic function was vigorous. The estimated ejection fraction was in the range of 65% to 70%. Wall motion was normal; there were no regional wall motion abnormalities. Doppler parameters are consistent with abnormal left ventricular relaxation (grade 1 diastolic dysfunction). - Aortic valve: Moderately calcified annulus. Trileaflet; moderately thickened, moderately calcified leaflets. Cusp separation was severely reduced. There was severe stenosis. Mild regurgitation. Mean gradient: 43mm Hg (S). Peak gradient: 70mm Hg (S). VTI ratio of LVOT to aortic valve: 0.29. Valve area: 0.92cm^2(VTI). Valve area: 0.82cm^2 (Vmax). - Mitral valve: Calcified annulus. Mildly thickened leaflets . Mild regurgitation. - Left atrium: The atrium was severely dilated. - Tricuspid valve: Trivial regurgitation. Peak RV-RA gradient: 27mm Hg (S). - Inferior vena cava: Not visualized. Unable to estimate CVP. - Pericardium, extracardiac: There was no pericardial effusion. Impressions:  - Mild LVH with LVEF 65-70%, grade 1 diastolic dysfunction. Severe left atrial enlargement. MAC with mild mitral regurgitation. Severe calcific aortic stenosis with mild aortic regurgitation as outlined above - gradients have increased compared to the prior study. Trivial tricuspid regurgitation with RV-RA gradient 27  mmHg.  Cardiac catheterization Final Conclusions:  1. Severe double vessel coronary artery disease with severe stenosis of the proximal LAD and total occlusion of the native RCA  2. Patency of the left circumflex without significant stenosis.  3. Status post aortocoronary bypass surgery with continued patency of the LIMA to LAD, saphenous vein graft to diagonal, and saphenous vein graft to distal RCA  4. Total occlusion of the saphenous vein graft to obtuse marginal  5. Severe aortic stenosis  Recommendations: Will refer to cardiac surgery for consideration of treatment options for severe symptomatic aortic stenosis (TAVR versus redo sternotomy/SARV).    Impression: Mr. Lindblad is an 80 year old gentleman with a history of coronary disease he now has severe aortic stenosis. He had coronary bypass grafting 11 years ago. The vein graft to his obtuse marginal shutdown, but the circumflex only had moderate stenosis in 2004 and appears normal now. His remaining vein grafts are patent, there  is only some mild plaque in the vein graft to the right coronary. His left mammary graft is open as well.  His creatinine was elevated at 2.3 in March. It was 1.7 at the time of his catheterization but that was in the setting of hydration. He would be at high risk for perioperative renal failure.  I think that [name] would have a very difficult time with a redo for aVR. While there is no absolute contraindication, my impression is that he is deconditioned and would not thrive after a redo sternotomy.  In my opinion, his best option for treatment would be TAVR.  I discussed my findings with Dr. Excell Seltzerooper. He is in agreement. The patient will be referred to the  TAVR clinic

## 2013-10-19 ENCOUNTER — Other Ambulatory Visit: Payer: Self-pay | Admitting: *Deleted

## 2013-10-19 DIAGNOSIS — I359 Nonrheumatic aortic valve disorder, unspecified: Secondary | ICD-10-CM

## 2013-10-19 DIAGNOSIS — N289 Disorder of kidney and ureter, unspecified: Secondary | ICD-10-CM

## 2013-10-21 MED ORDER — SODIUM BICARBONATE BOLUS VIA INFUSION
INTRAVENOUS | Status: AC
  Administered 2013-10-23: 08:00:00 via INTRAVENOUS
  Filled 2013-10-21: qty 1

## 2013-10-21 MED ORDER — SODIUM BICARBONATE 8.4 % IV SOLN
INTRAVENOUS | Status: AC
  Administered 2013-10-23: 08:00:00 via INTRAVENOUS
  Filled 2013-10-21: qty 500

## 2013-10-22 ENCOUNTER — Ambulatory Visit (HOSPITAL_COMMUNITY)
Admission: RE | Admit: 2013-10-22 | Discharge: 2013-10-22 | Disposition: A | Discharge status: Home / Self Care | Payer: Medicare PPO | Source: Ambulatory Visit | Attending: Cardiovascular Disease | Admitting: Cardiovascular Disease

## 2013-10-22 DIAGNOSIS — I359 Nonrheumatic aortic valve disorder, unspecified: Secondary | ICD-10-CM

## 2013-10-22 DIAGNOSIS — I4891 Unspecified atrial fibrillation: Secondary | ICD-10-CM | POA: Insufficient documentation

## 2013-10-22 MED ORDER — ALBUTEROL SULFATE (2.5 MG/3ML) 0.083% IN NEBU
2.5000 mg | INHALATION_SOLUTION | Freq: Once | RESPIRATORY_TRACT | Status: AC
  Administered 2013-10-22: 2.5 mg via RESPIRATORY_TRACT

## 2013-10-23 ENCOUNTER — Ambulatory Visit (HOSPITAL_COMMUNITY)
Admission: RE | Admit: 2013-10-23 | Discharge: 2013-10-23 | Disposition: A | Discharge status: Home / Self Care | Payer: Medicare PPO | Source: Ambulatory Visit | Attending: Cardiovascular Disease | Admitting: Cardiovascular Disease

## 2013-10-23 VITALS — BP 155/83 | HR 76 | Temp 97.2°F | Resp 16

## 2013-10-23 DIAGNOSIS — Z01818 Encounter for other preprocedural examination: Secondary | ICD-10-CM | POA: Insufficient documentation

## 2013-10-23 DIAGNOSIS — I359 Nonrheumatic aortic valve disorder, unspecified: Secondary | ICD-10-CM

## 2013-10-23 DIAGNOSIS — N289 Disorder of kidney and ureter, unspecified: Secondary | ICD-10-CM | POA: Insufficient documentation

## 2013-10-23 LAB — POCT I-STAT, CHEM 8
BUN: 27 mg/dL — ABNORMAL HIGH (ref 6–23)
CHLORIDE: 108 meq/L (ref 96–112)
Calcium, Ion: 1.2 mmol/L (ref 1.13–1.30)
Creatinine, Ser: 1.8 mg/dL — ABNORMAL HIGH (ref 0.50–1.35)
Glucose, Bld: 99 mg/dL (ref 70–99)
HCT: 37 % — ABNORMAL LOW (ref 39.0–52.0)
HEMOGLOBIN: 12.6 g/dL — AB (ref 13.0–17.0)
POTASSIUM: 4.7 meq/L (ref 3.7–5.3)
Sodium: 141 mEq/L (ref 137–147)
TCO2: 21 mmol/L (ref 0–100)

## 2013-10-23 MED ORDER — SODIUM BICARBONATE 8.4 % IV SOLN
INTRAVENOUS | Status: AC
  Filled 2013-10-23: qty 500

## 2013-10-23 MED ORDER — IOHEXOL 350 MG/ML SOLN
80.0000 mL | Freq: Once | INTRAVENOUS | Status: AC | PRN
  Administered 2013-10-23: 80 mL via INTRAVENOUS

## 2013-10-23 MED ORDER — SODIUM BICARBONATE BOLUS VIA INFUSION
INTRAVENOUS | Status: AC
  Filled 2013-10-23: qty 1

## 2013-10-23 MED ORDER — SODIUM CHLORIDE 0.9 % IJ SOLN
INTRAMUSCULAR | Status: AC
  Filled 2013-10-23: qty 200

## 2013-10-23 MED ORDER — SODIUM CHLORIDE 0.9 % IJ SOLN
INTRAMUSCULAR | Status: AC
  Filled 2013-10-23: qty 10

## 2013-10-27 ENCOUNTER — Other Ambulatory Visit: Payer: Self-pay | Admitting: *Deleted

## 2013-10-27 DIAGNOSIS — I359 Nonrheumatic aortic valve disorder, unspecified: Secondary | ICD-10-CM

## 2013-10-29 ENCOUNTER — Ambulatory Visit (HOSPITAL_COMMUNITY): Payer: Medicare PPO

## 2013-11-02 ENCOUNTER — Ambulatory Visit: Payer: Medicare PPO | Attending: Thoracic Surgery (Cardiothoracic Vascular Surgery) | Admitting: Physical Therapy

## 2013-11-02 ENCOUNTER — Ambulatory Visit (HOSPITAL_COMMUNITY)
Admission: RE | Admit: 2013-11-02 | Discharge: 2013-11-02 | Disposition: A | Discharge status: Home / Self Care | Payer: Medicare PPO | Source: Ambulatory Visit | Attending: Cardiovascular Disease | Admitting: Cardiovascular Disease

## 2013-11-02 ENCOUNTER — Institutional Professional Consult (permissible substitution) (INDEPENDENT_AMBULATORY_CARE_PROVIDER_SITE_OTHER): Payer: Medicare PPO | Admitting: Surgery

## 2013-11-02 ENCOUNTER — Other Ambulatory Visit: Payer: Medicare PPO | Admitting: *Deleted

## 2013-11-02 ENCOUNTER — Other Ambulatory Visit: Payer: Self-pay | Admitting: *Deleted

## 2013-11-02 ENCOUNTER — Encounter: Payer: Self-pay | Admitting: Thoracic Surgery (Cardiothoracic Vascular Surgery)

## 2013-11-02 ENCOUNTER — Encounter: Payer: Self-pay | Admitting: Surgery

## 2013-11-02 VITALS — BP 154/50 | HR 50

## 2013-11-02 VITALS — BP 149/59 | HR 51

## 2013-11-02 DIAGNOSIS — I359 Nonrheumatic aortic valve disorder, unspecified: Secondary | ICD-10-CM

## 2013-11-02 DIAGNOSIS — IMO0001 Reserved for inherently not codable concepts without codable children: Secondary | ICD-10-CM | POA: Insufficient documentation

## 2013-11-02 DIAGNOSIS — R269 Unspecified abnormalities of gait and mobility: Secondary | ICD-10-CM | POA: Insufficient documentation

## 2013-11-02 LAB — PULMONARY FUNCTION TEST
DL/VA % PRED: 58 %
DL/VA: 2.69 ml/min/mmHg/L
DLCO COR % PRED: 41 %
DLCO cor: 13.58 ml/min/mmHg
DLCO unc % pred: 41 %
DLCO unc: 13.58 ml/min/mmHg
FEF 25-75 POST: 2.38 L/s
FEF 25-75 Pre: 2.11 L/sec
FEF2575-%Change-Post: 13 %
FEF2575-%Pred-Post: 128 %
FEF2575-%Pred-Pre: 113 %
FEV1-%Change-Post: 2 %
FEV1-%PRED-PRE: 98 %
FEV1-%Pred-Post: 100 %
FEV1-POST: 2.78 L
FEV1-Pre: 2.71 L
FEV1FVC-%Change-Post: 5 %
FEV1FVC-%Pred-Pre: 106 %
FEV6-%CHANGE-POST: -2 %
FEV6-%Pred-Post: 95 %
FEV6-%Pred-Pre: 97 %
FEV6-PRE: 3.55 L
FEV6-Post: 3.48 L
FEV6FVC-%Change-Post: 1 %
FEV6FVC-%Pred-Post: 107 %
FEV6FVC-%Pred-Pre: 106 %
FVC-%Change-Post: -2 %
FVC-%PRED-POST: 88 %
FVC-%Pred-Pre: 91 %
FVC-Post: 3.48 L
FVC-Pre: 3.59 L
POST FEV6/FVC RATIO: 100 %
Post FEV1/FVC ratio: 80 %
Pre FEV1/FVC ratio: 76 %
Pre FEV6/FVC Ratio: 99 %
RV % pred: 76 %
RV: 2.06 L
TLC % PRED: 77 %
TLC: 5.45 L

## 2013-11-02 IMAGING — CT CT HEART MORP W/ CTA COR W/ SCORE W/ CA W/CM &/OR W/O CM
1 of 16 series · 1 of 20 positions shown, 2 images · non-contrast
Comparison: None.

CLINICAL DATA: Severe aortic stenosis

EXAM:
Cardiac TAVR CT
TECHNIQUE: The patient was scanned on a [employer]ips [id number] scanner. A 120 kV
retrospective scan was triggered in the descending thoracic aorta at
111 HU's. Gantry rotation speed was 270 msecs and collimation was .9
mm. No beta blockade or nitro were given. The 3D data set was
reconstructed in 5% intervals of the R-R cycle. Systolic and
diastolic phases were analyzed on a dedicated work station using
MPR, MIP and VRT modes. The patient received 80 cc of contrast.

[Series 300: locator · axial · 0.35mm/px · z∈[-126,-126]mm · 1 of 1 slices shown, 2 images]
[im 1/1  vessel]
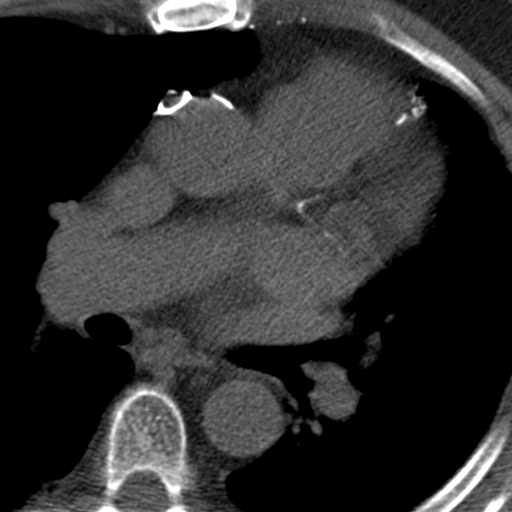
[im 1/1  lung]
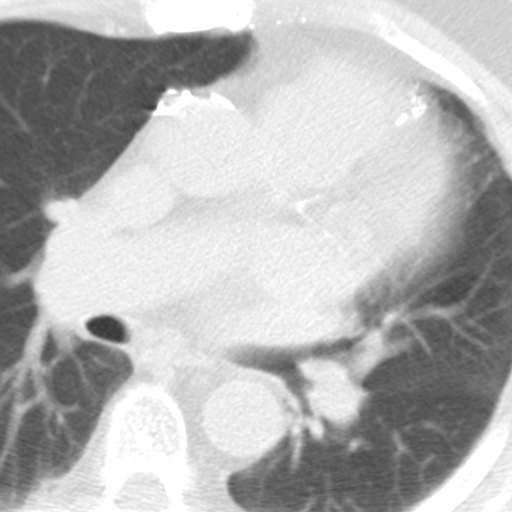

[1 of 20 positions shown; findings below may reference images not displayed]

FINDINGS: Aortic Valve: Moderately calcified with moderately decreased leaflet
opening

Aorta: Mild diffuse calcifications predominantly in the aortic arch
around the origin of the brachiocephalic arteries.

Sinotubular Junction:  28 x 29 mm

Ascending Thoracic Aorta:  33 x 32 mm

Aortic Arch:  29 x 27 mm

Descending Thoracic Aorta:  23 x 23 mm

Sinus of Valsalva Measurements:

Non-coronary:  34 mm

Right -coronary:  32 mm

Left -coronary:  34 mm

Coronary Artery Height above Annulus:

Left Main:  14 mm

Right Coronary:  16 mm

Virtual Basal Annulus Measurements:

Maximum/Minimum Diameter:  30 x 25 mm

Perimeter:  97 mm

Area:  590 mm2

Coronary Arteries: The study performed without use of NTG. There is
severe two vessel CAD, occluded RCA in the proximal segment,
calcified plague associated with > 70 stenosis in the proximal LAD.
Patent [REDACTED] to LAD, patent CLIFF to D1, patent CLIFF to distal RCA.
Occluded CLIFF at the ostium (most probably to OM).

Optimum Fluoroscopic Angle for Delivery:  LAO 16 NAQI 0

Annulus to sheath tip distance: 66 mm (minimum 60 mm required for 29
mm valve).
IMPRESSION: 1. Moderately calcified and thickened aortic valve with moderately
decreased leaflet opening.

2. Annulus measurements suitable for TAVR with 29 mm Edwards -
Sapien valve.

3. Sufficient distance between annulus and coronary arteries for
valve deployment.

4. Optimum fluoroscopic angle for delivery:  LAO 16 NAQI 0.

5. Sufficient annulus to sheath tip distance.

6. Severe two vessel disease, occluded RCA, severe proximal LAD
stenosis. Patent [REDACTED] to CAHN, ELAINE to D[REDACTED] to distal RCA.
Occluded CLIFF to OM.

CLIFF

EXAM:
OVER-READ INTERPRETATION  CT CHEST

The following report is an over-read performed by radiologist Dr.
Simon M Diya [REDACTED] on 11/02/2013. This over-read
does not include interpretation of cardiac or coronary anatomy or
pathology. The coronary CTA interpretation by the cardiologist is
attached.
FINDINGS: Prior median sternotomy and CABG. No mediastinal, hilar, or axillary
adenopathy.

No filling defects in the central pulmonary arteries to suggest
large or segmental pulmonary emboli. No pleural effusions. Linear
densities in the lingula and both lower lobes at the lung bases most
compatible with scarring. No confluent airspace opacities or
suspicious pulmonary nodules.

No acute bony abnormality or focal bone lesion. Imaging into the
upper abdomen shows no acute findings.

Aorta is normal caliber.
IMPRESSION: No acute or significant extracardiac abnormality.

Prior CABG.

Areas of scarring in the lingula and lung bases.

## 2013-11-02 MED ORDER — SODIUM BICARBONATE 8.4 % IV SOLN
INTRAVENOUS | Status: AC
  Administered 2013-11-02: 12:00:00 via INTRAVENOUS
  Filled 2013-11-02: qty 500

## 2013-11-02 MED ORDER — SODIUM BICARBONATE BOLUS VIA INFUSION
INTRAVENOUS | Status: AC
  Filled 2013-11-02 (×2): qty 1000

## 2013-11-02 MED ORDER — SODIUM BICARBONATE BOLUS VIA INFUSION
INTRAVENOUS | Status: DC
  Filled 2013-11-02 (×3): qty 1

## 2013-11-02 MED ORDER — SODIUM BICARBONATE 8.4 % IV SOLN
INTRAVENOUS | Status: DC
  Filled 2013-11-02: qty 500

## 2013-11-02 MED ORDER — IOHEXOL 350 MG/ML SOLN
80.0000 mL | Freq: Once | INTRAVENOUS | Status: AC | PRN
  Administered 2013-11-02: 80 mL via INTRAVENOUS

## 2013-11-03 ENCOUNTER — Encounter: Payer: Self-pay | Admitting: Surgery

## 2013-11-03 ENCOUNTER — Encounter: Payer: Self-pay | Admitting: Thoracic Surgery (Cardiothoracic Vascular Surgery)

## 2013-11-03 NOTE — Progress Notes (Signed)
[facility]  MULTIDISCIPLINARY HEART VALVE CLINIC    CARDIOTHORACIC SURGERY CONSULTATION REPORT  Referring Provider is [name], MD PCP is Toma Deiters, MD  Chief Complaint  Patient presents with  . Aortic Stenosis    EVAL FOR TAVR SURGERY    HPI:  The patient is an 80 year old gentleman who underwent CABG x 4 by Dr. Dorris Fetch in 2004 after presenting with a NSTEMI. He had a LIMA to the LAD, SVG to diagonal, SVG to OM1, and a SVG to the distal RCA. He has done well until the past year when he began having chest discomfort, shortness of breath and fatigue with exertion. 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 He has a history of aortic stenosis and had a repeat 2D-echo on 09/10/2013 showing significant progression with a mean transvalvular gradient of 43 mm Hg and a peak gradient of 70 mm Hg. The AVA was 0.82 cm2. LVEF was 65-70%. He was seen by Dr. Excell Seltzer and Dr. Dorris Fetch and was felt to be a TAVR candidate. Cardiac cath showed severe double vessel CAD with high grade proximal LAD stenosis and total occlusion of the RCA. The LCX was patent. The LIMA graft was patent as was the SVG's to the diagonal and distal RCA. The SVG to the OM was occluded. His peak to peak gradient was 40, mean gradient 32, and AVA 1.0 cm2.  Past Medical History  Diagnosis Date  . Coronary atherosclerosis of native coronary artery     Multivessel, LVEF 65%  . Mixed hyperlipidemia   . Essential hypertension, benign   . Myocardial infarction   . Renal insufficiency   . Arthritis   . Aortic stenosis     Past Surgical History  Procedure Laterality Date  . Umbilical hernia repair    . Appendectomy    . Left total knee arthroplasty    . Coronary artery bypass graft      LIMA to LAD, SVG to OM, SVG to RCA, SVG to diagonal 11/04  . Cataract  extraction Left 04/27/13    Dr. Lita Mains    History reviewed. No pertinent family history.  History   Social History  . Marital Status: Married    Spouse Name: N/A    Number of Children: N/A  . Years of Education: N/A   Occupational History  . Not on file.   Social History Main Topics  . Smoking status: Never Smoker   . Smokeless tobacco: Never Used     Comment: tobacco use - no  . Alcohol Use: No  . Drug Use: No  . Sexual Activity: Not on file   Other Topics Concern  . Not on file   Social History Narrative   Married.     Current Outpatient Prescriptions  Medication Sig Dispense Refill  . acetaminophen (TYLENOL) 325 MG tablet Take 650 mg by mouth every 6 (six) hours as needed for mild pain.       Marland Kitchen aspirin 81 MG tablet Take 81 mg by mouth daily.        . famotidine (PEPCID) 20 MG tablet Take 20 mg by mouth 2 (two) times daily as needed for heartburn.       . hydrALAZINE (APRESOLINE) 10 MG tablet Take 1 tablet (10 mg total) by mouth 3 (three) times daily.  270  tablet  3  . isosorbide mononitrate (IMDUR) 30 MG 24 hr tablet Take one-half by mouth daily  45 tablet  3  . metoprolol (LOPRESSOR) 50 MG tablet Take 1 tablet (50 mg total) by mouth 2 (two) times daily.  180 tablet  3  . nitroGLYCERIN (NITROSTAT) 0.4 MG SL tablet Place 1 tablet (0.4 mg total) under the tongue every 5 (five) minutes as needed.  25 tablet  3  . Omega-3 Fatty Acids (FISH OIL) 1200 MG CAPS Take 1 capsule by mouth 3 (three) times daily.        . simvastatin (ZOCOR) 40 MG tablet Take 1 tablet (40 mg total) by mouth every evening.  30 tablet  6   No current facility-administered medications for this visit.    Allergies  Allergen Reactions  . Lipitor [Atorvastatin Calcium]     Leg pain      Review of Systems:   General:  normal appetite, decreased energy, no weight gain, no weight loss, no fever  Cardiac:  positive chest pain with exertion, no chest pain at rest, positive SOB with mild exertion,  no resting SOB, no PND, no orthopnea, no palpitations, no arrhythmia, no atrial fibrillation, no LE edema, no dizzy spells, no syncope  Respiratory:  positive shortness of breath, no home oxygen, no productive cough, no dry cough, no bronchitis, no wheezing, no hemoptysis, no asthma, no pain with inspiration or cough, no sleep apnea, no CPAP at night  GI:   no difficulty swallowing, no reflux, no frequent heartburn, no hiatal hernia, no abdominal pain, no constipation, no diarrhea, no hematochezia, no hematemesis, no melena  GU:   no dysuria,  no frequency, no urinary tract infection, no hematuria, no enlarged prostate, no kidney stones, no kidney disease  Vascular:  no pain suggestive of claudication, no pain in feet, no leg cramps, no varicose veins, no DVT, no non-healing foot ulcer  Neuro:   no stroke, no TIA's, no seizures, no headaches, notemporary blindness one eye,  no slurred speech, no peripheral neuropathy, no chronic pain, no instability of gait, no memory/cognitive dysfunction  Musculoskeletal: Positive degenerative arthritis, no joint swelling, no myalgias, no difficulty walking, normal mobility   Skin:   no rash, no itching, no skin infections, no pressure sores or ulcerations  Psych:   no anxiety, no depression, no nervousness, no unusual recent stress  Eyes:   no blurry vision, no floaters, no recent vision changes, no wears glasses or contacts  ENT:   has hearing loss and wears bilateral hearing aids, no loose or painful teeth,   Hematologic:  no easy bruising, no abnormal bleeding, no clotting disorder, no frequent epistaxis  Endocrine:  no diabetes           Physical Exam:   BP 154/50  Pulse 50  General:             well-appearing  HEENT:  Unremarkable , NCAT, PERLA, EOMI, oropharynx clear  Neck:   no JVD, no bruits, no adenopathy or thyromegaly  Chest:   clear to auscultation, symmetrical breath sounds, no wheezes, no rhonchi   CV:   RRR, grade III/VI  crescendo/decrescendo murmur heard best at RSB,  no diastolic murmur  Abdomen:  soft, non-tender, no masses or organomegaly  Extremities:  warm, well-perfused, pulses normal, no LE edema  Rectal/GU  Deferred  Neuro:   Grossly non-focal and symmetrical throughout  Skin:   Clean and dry, no rashes, no breakdown   Diagnostic Tests:  *[place] [address] [place], [place] [address] [phone]  ------------------------------------------------------------ Transthoracic Echocardiography  Patient: Clayvon, Parlett MR #: [id number] Study Date: 09/10/2013 Gender: M Age: 39 Height: 177.8cm Weight: 83.9kg BSA: 2.39m^2 Pt. Status: Room:  ATTENDING Leodis Binet, Brand Males SONOGRAPHER Greggory Stallion, RDCS PERFORMING [facility] cc:  ------------------------------------------------------------ LV EF: 65% - 70%  ------------------------------------------------------------ History: PMH: Moderate to severe AoV stenosis 09-14. Dyspnea. Coronary artery disease. Aortic valve disease. Risk factors: Hypertension. Dyslipidemia.  ------------------------------------------------------------ Study Conclusions  - Left ventricle: The cavity size was normal. Wall thickness was increased in a pattern of mild LVH. Systolic function was vigorous. The estimated ejection fraction was in the range of 65% to 70%. Wall motion was normal; there were no regional wall motion abnormalities. Doppler parameters are consistent with abnormal left ventricular relaxation (grade 1 diastolic dysfunction). - Aortic valve: Moderately calcified annulus. Trileaflet; moderately thickened, moderately calcified leaflets. Cusp separation was severely reduced. There was severe stenosis. Mild regurgitation. Mean gradient: 43mm Hg (S). Peak gradient: 70mm Hg (S). VTI ratio of LVOT to aortic valve: 0.29. Valve area: 0.92cm^2(VTI). Valve area: 0.82cm^2  (Vmax). - Mitral valve: Calcified annulus. Mildly thickened leaflets . Mild regurgitation. - Left atrium: The atrium was severely dilated. - Tricuspid valve: Trivial regurgitation. Peak RV-RA gradient: 27mm Hg (S). - Inferior vena cava: Not visualized. Unable to estimate CVP. - Pericardium, extracardiac: There was no pericardial effusion. Impressions:  - Mild LVH with LVEF 65-70%, grade 1 diastolic dysfunction. Severe left atrial enlargement. MAC with mild mitral regurgitation. Severe calcific aortic stenosis with mild aortic regurgitation as outlined above - gradients have increased compared to the prior study. Trivial tricuspid regurgitation with RV-RA gradient 27 mmHg.  ------------------------------------------------------------ Labs, prior tests, procedures, and surgery: Coronary artery bypass grafting.  Transthoracic echocardiography. M-mode, complete 2D, spectral Doppler, and color Doppler. Height: Height: 177.8cm. Height: 70in. Weight: Weight: 83.9kg. Weight: 184.6lb. Body mass index: BMI: 26.5kg/m^2. Body surface area: BSA: 2.47m^2. Blood pressure: 150/70. Patient status: Outpatient.  ------------------------------------------------------------  ------------------------------------------------------------ Left ventricle: The cavity size was normal. Wall thickness was increased in a pattern of mild LVH. Systolic function was vigorous. The estimated ejection fraction was in the range of 65% to 70%. Wall motion was normal; there were no regional wall motion abnormalities. Doppler parameters are consistent with abnormal left ventricular relaxation (grade 1 diastolic dysfunction).  ------------------------------------------------------------ Aortic valve: Moderately calcified annulus. Trileaflet; moderately thickened, moderately calcified leaflets. Cusp separation was severely reduced. Doppler: There was severe stenosis. Mild regurgitation. VTI ratio of LVOT to  aortic valve: 0.29. Valve area: 0.92cm^2(VTI). Peak velocity ratio of LVOT to aortic valve: 0.26. Valve area: 0.82cm^2 (Vmax). Mean gradient: 43mm Hg (S). Peak gradient: 70mm Hg (S).  ------------------------------------------------------------ Aorta: Aortic root: The aortic root was normal in size.  ------------------------------------------------------------ Mitral valve: Calcified annulus. Mildly thickened leaflets . Doppler: Mild regurgitation. Peak gradient: 3mm Hg (D).  ------------------------------------------------------------ Left atrium: The atrium was severely dilated.  ------------------------------------------------------------ Right ventricle: The cavity size was normal. Systolic function was normal.  ------------------------------------------------------------ Pulmonic valve: The valve appears to be grossly normal. Doppler: Trivial regurgitation.  ------------------------------------------------------------ Tricuspid valve: The valve appears to be grossly normal. Doppler: Trivial regurgitation.  ------------------------------------------------------------ Pulmonary artery: Systolic pressure could not be accurately estimated.  ------------------------------------------------------------ Right atrium: The atrium was normal in size.  ------------------------------------------------------------ Pericardium: There was no pericardial effusion.  ------------------------------------------------------------ Systemic veins: Inferior vena cava: Not visualized. Unable to estimate CVP.  ------------------------------------------------------------  2D measurements Normal Doppler measurements Normal Left ventricle LVOT LVID  ED, 45.6 mm 43-52 Peak vel, S 109 cm/s ------ chord, VTI, S 36. cm ------ PLAX 1 LVID ES, 30.5 mm 23-38 Aortic valve chord, Peak vel, S 419 cm/s ------ PLAX Mean vel, S 309 cm/s ------ FS, chord, 33 % >29 VTI, S 123 cm ------ PLAX Mean 43 mm  ------ LVPW, ED 11.3 mm ------ gradient, S Hg IVS/LVPW 1.31 <1.3 Peak 70 mm ------ ratio, ED gradient, S Hg Ventricular septum VTI ratio 0.2 ------ IVS, ED 14.8 mm ------ LVOT/AV 9 LVOT Area, VTI 0.9 cm^2 ------ Diam, S 20 mm ------ 2 Area 3.14 cm^2 ------ Peak vel 0.2 ------ Aorta ratio, 6 Root diam, 31 mm ------ LVOT/AV ED Area, Vmax 0.8 cm^2 ------ Left atrium 2 AP dim 46 mm ------ Mitral valve AP dim 2.28 cm/m^2 <2.2 Peak E vel 90. cm/s ------ index 2 Vol, S 96 ml ------ Peak A vel 99. cm/s ------ Vol index, 47.5 ml/m^2 ------ 9 S Deceleration 183 ms 150-23 Right ventricle time 0 RVID ED, 39.7 mm 19-38 Peak 3 mm ------ PLAX gradient, D Hg Peak E/A 0.9 ------ ratio Tricuspid valve Regurg peak 262 cm/s ------ vel Peak RV-RA 27 mm ------ gradient, S Hg Pulmonic valve Regurg vel, 71. cm/s ------ ED 9  ------------------------------------------------------------ Prepared and Electronically Authenticated by  Nona DellMcDowell, Samuel 2015-03-12T13:19:[id number]   Cardiac Catheterization Procedure Note  Name: Lessie DingsJames H Eppinger  MRN: [id number]  DOB: February 28, 1929  Procedure: Right Heart Cath, Left Heart Cath, Selective Coronary Angiography, LV angiography  Indication: Severe aortic stenosis, progressive symptoms of dyspnea and exertional angina (functional class III)  Procedural Details: The right groin was prepped, draped, and anesthetized with 1% lidocaine. Using the modified Seldinger technique a 5 French sheath was placed in the right femoral artery and a 7 French sheath was placed in the right femoral vein. A Swan-Ganz catheter was used for the right heart catheterization. Standard protocol was followed for recording of right heart pressures and sampling of oxygen saturations. Fick cardiac output was calculated. Standard Judkins catheters were used for selective coronary angiography. Ventriculography was deferred because of chronic kidney disease. A straight tip wire was used to cross the  aortic valve. A JR 4 catheter was used for saphenous vein graft angiography. LIMA catheter was used for LIMA angiography. There were no immediate procedural complications. The patient was transferred to the post catheterization recovery area for further monitoring.  Procedural Findings:  Hemodynamics  RA mean of 3  RV 56/6  PA 53/11 with a mean of 26  PCWP A wave 13, V wave 9, mean of 9  LV 233/30  AO 193/70 with a mean of 118  Oxygen saturations:  PA 64  AO 90  Cardiac Output (Fick) 5.9  Cardiac Index (Fick) 2.9  Aortic valve hemodynamics:  Peak to peak gradient 40 mm mercury, mean gradient 32 mm mercury, aortic valve area 1.0 cm    Coronary angiography:  Coronary dominance: right  Left mainstem: Patent without obstructive disease. Arises from the left cusp it divides into the LAD and left circumflex.  Left anterior descending (LAD): The LAD is patent with 90% proximal stenosis just before the first diagonal branch. The distal vessel and first diagonal both fill from competitive graft flow.  Left circumflex (LCx): The left circumflex is patent. The vessel supplies a first obtuse marginal without significant disease. The first OM is tented up at the site of previous vein graft anastomosis. There is a small intermediate branch without significant disease.  Right coronary artery (RCA):  The native RCA was not selectively injected. The vessel fills from the saphenous vein graft. The vessel fills retrograde and is occluded.  Saphenous vein graft to distal RCA. The vein graft is patent. There is an irregular plaque with no more than 30-40% stenosis in the proximal graft. The PDA and PLA branches are patent.  Saphenous vein graft to OM: 100% occlusion at the proximal anastomosis  Saphenous vein graft to first diagonal: Widely patent without significant stenosis. There is extensive filling of the left coronary artery via antegrade and retrograde graft flow.  LIMA to LAD: Widely patent throughout  its course. The coronary anastomotic site is widely patent. The LAD has no significant distal vessel disease beyond the LIMA insertion site.  Aortic valve under fluoroscopy is severely calcified with restricted mobility.  Left ventriculography: Deferred because of chronic kidney disease  Final Conclusions:  1. Severe double vessel coronary artery disease with severe stenosis of the proximal LAD and total occlusion of the native RCA  2. Patency of the left circumflex without significant stenosis.  3. Status post aortocoronary bypass surgery with continued patency of the LIMA to LAD, saphenous vein graft to diagonal, and saphenous vein graft to distal RCA  4. Total occlusion of the saphenous vein graft to obtuse marginal  5. Severe aortic stenosis  Recommendations: Will refer to cardiac surgery for consideration of treatment options for severe symptomatic aortic stenosis (TAVR versus redo sternotomy/SARV).  [name]  10/13/2013, 1:59 PM     Pulmonary Function Testing   Ref Range 1wk ago   FVC-Pre L 3.59    FVC-%Pred-Pre % 91    FVC-Post L 3.48    FVC-%Pred-Post % 88    FVC-%Change-Post % -2    FEV1-Pre L 2.71    FEV1-%Pred-Pre % 98    FEV1-Post L 2.78    FEV1-%Pred-Post % 100    FEV1-%Change-Post % 2    FEV6-Pre L 3.55    FEV6-%Pred-Pre % 97    FEV6-Post L 3.48    FEV6-%Pred-Post % 95    FEV6-%Change-Post % -2    Pre FEV1/FVC ratio % 76    FEV1FVC-%Pred-Pre % 106    Post FEV1/FVC ratio % 80    FEV1FVC-%Change-Post % 5    Pre FEV6/FVC Ratio % 99    FEV6FVC-%Pred-Pre % 106    Post FEV6/FVC ratio % 100    FEV6FVC-%Pred-Post % 107    FEV6FVC-%Change-Post % 1    FEF 25-75 Pre L/sec 2.11    FEF2575-%Pred-Pre % 113    FEF 25-75 Post L/sec 2.38    FEF2575-%Pred-Post % 128    FEF2575-%Change-Post % 13    RV L 2.06    RV % pred % 76    TLC L 5.45    TLC % pred % 77    DLCO unc ml/min/mmHg 13.58    DLCO unc % pred % 41    DLCO cor ml/min/mmHg 13.58    DLCO cor % pred %  41    DL/VA ml/min/mmHg/L 1.61    DL/VA % pred % 58    Resulting Agency [facility]    Specimen Collected: 10/22/13 9:26 AM Last Resulted: 11/02/13 12:53 PM    EXAM:  OVER-READ INTERPRETATION CT CHEST  The following report is an over-read performed by radiologist Dr.  Noe Gens [facility] on 11/02/2013. This over-read  does not include interpretation of cardiac or coronary anatomy or  pathology. The coronary CTA interpretation by the cardiologist is  attached.  COMPARISON: None.  FINDINGS:  Prior median sternotomy and CABG. No mediastinal, hilar, or axillary  adenopathy.  No filling defects in the central pulmonary arteries to suggest  large or segmental pulmonary emboli. No pleural effusions. Linear  densities in the lingula and both lower lobes at the lung bases most  compatible with scarring. No confluent airspace opacities or  suspicious pulmonary nodules.  No acute bony abnormality or focal bone lesion. Imaging into the  upper abdomen shows no acute findings.  Aorta is normal caliber.  IMPRESSION:  No acute or significant extracardiac abnormality.  Prior CABG.  Areas of scarring in the lingula and lung bases.  Electronically Signed  By: Charlett Nose M.D.  On: 11/02/2013 17:24   CLINICAL DATA: 80 year old male with history of severe aortic  stenosis. Preprocedural examination prior to potential transcatheter  aortic valve replacement.  EXAM:  CT ANGIOGRAPHY CHEST, ABDOMEN AND PELVIS  TECHNIQUE:  Multidetector CT imaging through the chest, abdomen and pelvis was  performed using the standard protocol during bolus administration of  intravenous contrast. Multiplanar reconstructed images and MIPs were  obtained and reviewed to evaluate the vascular anatomy.  CONTRAST: 80mL OMNIPAQUE IOHEXOL 350 MG/ML SOLN  COMPARISON: CT of the abdomen and pelvis 05/13/2003.  FINDINGS:  CT CHEST FINDINGS  Mediastinum: Aortic valve appears thickened and heavily calcified.   Heart size is mildly enlarged. There is no significant pericardial  fluid, thickening or pericardial calcification. There is  atherosclerosis of the thoracic aorta, the great vessels of the  mediastinum and the coronary arteries, including calcified  atherosclerotic plaque in the left main, left anterior descending  and right coronary arteries. Status post median sternotomy for CABG,  including LIMA to the LAD. No pathologically enlarged mediastinal or  hilar lymph nodes. Esophagus is unremarkable in appearance.  Lungs/Pleura: 4 mm nodule in the lateral aspect of the right upper  lobe (image 32 of series 5). No other larger more suspicious  appearing pulmonary nodules or masses. Trace left pleural effusion  or pleural thickening. No acute consolidative airspace disease.  Musculoskeletal: Median sternotomy wires. There are no aggressive  appearing lytic or blastic lesions noted in the visualized portions  of the skeleton.  CT ABDOMEN AND PELVIS FINDINGS  Abdomen/Pelvis: The appearance of the liver, gallbladder, pancreas,  spleen and bilateral adrenal glands is unremarkable. Mild bilateral  renal atrophy with multifocal areas of cortical thinning in the  kidneys bilaterally, presumably scarring related to prior  infection/inflammation. Multiple low-attenuation lesions in the  kidneys bilaterally, many of which are subcentimeter and too small  to definitively characterize. In addition, there are bilateral  simple cysts, largest of which is exophytic in the lower pole of the  right kidney measuring up to 7.1 cm in diameter. No significant  volume of ascites. No pneumoperitoneum. No pathologic distention of  small bowel. No definite lymphadenopathy identified within the  abdomen or pelvis. Several colonic diverticulae are noted, most  pronounced in the region of the sigmoid colon, without surrounding  inflammatory changes to suggest an acute diverticulitis at this  time. Prostate gland and  urinary bladder are unremarkable in  appearance.  Musculoskeletal: Degenerative disc disease and lumbar spondylosis.  There are no aggressive appearing lytic or blastic lesions noted in  the visualized portions of the skeleton.  VASCULAR MEASUREMENTS PERTINENT TO TAVR:  AORTA:  Minimal Aortic Diameter - 13.5 x 13.0 mm  Severity of Aortic Calcification - moderate to severe  RIGHT PELVIS:  Right Common Iliac Artery -  Minimal Diameter - 8.0  x 8.7 mm  Tortuosity - Severe  Calcification - Mild to moderate  Right External Iliac Artery -  Minimal Diameter - 8.5 x 8.2 mm  Tortuosity - Mild  Calcification - Minimal  Right Common Femoral Artery -  Minimal Diameter - 6.7 x 6.6 mm  Tortuosity - Mild  Calcification - Minimal  LEFT PELVIS:  Left Common Iliac Artery -  Minimal Diameter - 9.8 x 8.4 mm  Tortuosity - Moderate  Calcification - Mild  Left External Iliac Artery -  Minimal Diameter - 8.2 x 8.4 mm  Tortuosity - Moderate to severe.  Calcification - Minimal  Left Common Femoral Artery -  Minimal Diameter - 8.2 x 8.6 mm  Tortuosity - Minimal  Calcification - None  Review of the MIP images confirms the above findings.  IMPRESSION:  1. Findings an measurements pertinent to potential TAVR procedure,  as detailed above. The patient does appear to have suitable pelvic  arterial access, most favorable on the left side related to reduced  tortuosity of the left common iliac artery relative to the right.  2. 4 mm nodule in the lateral aspect of the right upper lobe. If the  patient is at high risk for bronchogenic carcinoma, follow-up chest  CT at 1 year is recommended. If the patient is at low risk, no  follow-up is needed. This recommendation follows the consensus  statement: Guidelines for Management of Small Pulmonary Nodules  Detected on CT Scans: A Statement from the Fleischner Society as  published in Radiology 2005; 237:395-400.  3. Mild colonic diverticulosis without findings  to suggest acute  diverticulitis at this time.  4. Additional incidental findings, as above.  Electronically Signed  By: Trudie Reed M.D.  On: 10/23/2013 10:55   STS Risk Profile  Risk of Mortality: 14.7% Risk of Morbidity or Mortality: 47.9%  Risk of Stroke: 3.1% Risk of prolonged ventilation: 45%  Impression:  He is an 80 year old gentleman with severe symptomatic aortic stenosis and NYHA Class III symptoms of angina, shortness of breath, and fatigue. He has remained active until recently. He has had prior CABG but has patent grafts with the exception of the SVG to the OM. The LCX has no significant stenosis which is likely why this graft closed. I discussed the natural history of severe aortic stenosis with the patient and his wife and daughter, including the rapidly progressive nature at this point and poor prognosis without valve replacement. I discussed the treatment options available including traditional open surgery and TAVR. His operative risk is quite high as estimated by the STS risk profile due to his advanced age, redo surgery, severe lung disease, and chronic renal insufficiency with a creatinine of 1.8. I think TAVR is a reasonable alternative for this patient and will have a lower mortality risk and much quicker recovery. I also discussed the option of palliative care as his condition worsens but I would not recommend this since he is in overall fairly good shape for his age.   During the course of the patient's preoperative work up he has been evaluated comprehensively by a multidisciplinary team of specialists coordinated through the Multidisciplinary Heart Valve Clinic in the [facility].  He has been demonstrated to suffer from symptomatic severe aortic stenosis as noted above. The patient has been counseled extensively as to the relative risks and benefits of all options for the treatment of severe aortic stenosis including long term medical  therapy, conventional surgery for aortic valve  replacement, and transcatheter aortic valve replacement.  The patient has been independently evaluated by two cardiac surgeons including myself and Dr. Charlett LangoSteven Hendrickson, and Dr. Tonny BollmanMichael Cooper of interventional cardiology. He is felt to be at high risk for conventional surgical aortic valve replacement based upon a predicted risk of mortality using the Society of Thoracic Surgeons risk calculator of 14.7% and both of us feel that the patient would be a poor candidate for conventional surgery  because of comorbidities including advanced age, redo surgery, and chronic renal failure.    Based upon review of all of the patient's preoperative diagnostic tests they are felt to be candidate for transcatheter aortic valve replacement using the transfemoral approach as an alternative to high risk conventional surgery.    Following the decision to proceed with transcatheter aortic valve replacement, a discussion has been held regarding what types of management strategies would be attempted intraoperatively in the event of life-threatening complications, including whether or not the patient would be considered a candidate for the use of cardiopulmonary bypass and/or conversion to open sternotomy for attempted surgical intervention.  The patient has been advised of a variety of complications that might develop including but not limited to risks of death, stroke, paravalvular leak, aortic dissection or other major vascular complications, aortic annulus rupture, device embolization, cardiac rupture or perforation, mitral regurgitation, acute myocardial infarction, arrhythmia, heart block or bradycardia requiring permanent pacemaker placement, congestive heart failure, respiratory failure, renal failure, pneumonia, infection, other late complications related to structural valve deterioration or migration, or other complications that might ultimately cause a temporary or  permanent loss of functional independence or other long term morbidity.  The patient provides full informed consent for the procedure as described and all questions were answered.   Plan:  He will be scheduled for transfemoral TAVR on 11/17/2013.    Alleen BorneBryan K Infant Zink, MD 11/02/2013

## 2013-11-04 ENCOUNTER — Encounter: Payer: Medicare PPO | Admitting: Surgery

## 2013-11-11 ENCOUNTER — Encounter (HOSPITAL_COMMUNITY): Payer: Self-pay | Admitting: Pharmacy Technician

## 2013-11-12 NOTE — Pre-Procedure Instructions (Addendum)
Craig GitelmanJames H ZOXWRUEundiff  11/12/2013   Your procedure is scheduled on:  Tuesday, May 19th  [facility] at 0730 AM.  Call this number if you have problems the morning of surgery: [phone]   Remember:   Do not eat food or drink liquids after midnight.   Take these medicines the morning of surgery with A SIP OF WATER: lopressor, hydralazine, isosorbide,pepcid, tylenol if needed, nitro if needed   Take all meds as ordered until day of surgery except as instructed below or per dr    Craig AriasSTOP all herbel meds, nsaids (aleve,naproxen,advil,ibuprofen) now including vitamins,fish oil   Do not wear jewelry, make-up or nail polish.  Do not wear lotions, powders, or perfumes. You may wear deodorant.  Do not shave 48 hours prior to surgery. Men may shave face and neck.  Do not bring valuables to the hospital.  [facility] is not responsible   for any belongings or valuables.               Contacts, dentures or bridgework may not be worn into surgery.  Leave suitcase in the car. After surgery it may be brought to your room.  For patients admitted to the hospital, discharge time is determined by your   treatment team.               Please read over the following fact sheets that you were given: Pain Booklet, Coughing and Deep Breathing, Blood Transfusion Information, MRSA Information and Surgical Site Infection Prevention [place] - Preparing for Surgery  Before surgery, you can play an important role.  Because skin is not sterile, your skin needs to be as free of germs as possible.  You can reduce the number of germs on you skin by washing with CHG (chlorahexidine gluconate) soap before surgery.  CHG is an antiseptic cleaner which kills germs and bonds with the skin to continue killing germs even after washing.  Please DO NOT use if you have an allergy to CHG or antibacterial soaps.  If your skin becomes reddened/irritated stop using the CHG and inform your nurse when you  arrive at Short Stay.  Do not shave (including legs and underarms) for at least 48 hours prior to the first CHG shower.  You may shave your face.  Please follow these instructions carefully:   1.  Shower with CHG Soap the night before surgery and the morning of Surgery.  2.  If you choose to wash your hair, wash your hair first as usual with your normal shampoo.  3.  After you shampoo, rinse your hair and body thoroughly to remove the shampoo.  4.  Use CHG as you would any other liquid soap.  You can apply CHG directly to the skin and wash gently with scrungie or a clean washcloth.  5.  Apply the CHG Soap to your body ONLY FROM THE NECK DOWN.  Do not use on open wounds or open sores.  Avoid contact with your eyes, ears, mouth and genitals (private parts).  Wash genitals (private parts) with your normal soap.  6.  Wash thoroughly, paying special attention to the area where your surgery will be performed.  7.  Thoroughly rinse your body with warm water from the neck down.  8.  DO NOT shower/wash with your normal soap after using and rinsing off the CHG Soap.  9.  Pat yourself dry with a clean towel.  10.  Wear clean pajamas.            11.  Place clean sheets on your bed the night of your first shower and do not sleep with pets.  Day of Surgery  Do not apply any lotions/deoderants the morning of surgery.  Please wear clean clothes to the hospital/surgery center.

## 2013-11-13 ENCOUNTER — Encounter (HOSPITAL_COMMUNITY)
Admission: RE | Admit: 2013-11-13 | Discharge: 2013-11-13 | Disposition: A | Discharge status: Home / Self Care | Payer: Medicare PPO | Source: Ambulatory Visit | Attending: Cardiovascular Disease | Admitting: Cardiovascular Disease

## 2013-11-13 ENCOUNTER — Encounter (HOSPITAL_COMMUNITY): Payer: Self-pay

## 2013-11-13 VITALS — BP 182/73 | HR 57 | Temp 97.6°F | Resp 20 | Ht 70.0 in | Wt 185.9 lb

## 2013-11-13 DIAGNOSIS — I359 Nonrheumatic aortic valve disorder, unspecified: Secondary | ICD-10-CM

## 2013-11-13 DIAGNOSIS — Z01818 Encounter for other preprocedural examination: Secondary | ICD-10-CM | POA: Insufficient documentation

## 2013-11-13 DIAGNOSIS — Z01812 Encounter for preprocedural laboratory examination: Secondary | ICD-10-CM | POA: Insufficient documentation

## 2013-11-13 DIAGNOSIS — Z01810 Encounter for preprocedural cardiovascular examination: Secondary | ICD-10-CM | POA: Insufficient documentation

## 2013-11-13 LAB — COMPREHENSIVE METABOLIC PANEL
ALT: 10 U/L (ref 0–53)
AST: 16 U/L (ref 0–37)
Albumin: 3.6 g/dL (ref 3.5–5.2)
Alkaline Phosphatase: 66 U/L (ref 39–117)
BUN: 44 mg/dL — ABNORMAL HIGH (ref 6–23)
CALCIUM: 9 mg/dL (ref 8.4–10.5)
CO2: 16 mEq/L — ABNORMAL LOW (ref 19–32)
CREATININE: 1.83 mg/dL — AB (ref 0.50–1.35)
Chloride: 109 mEq/L (ref 96–112)
GFR calc Af Amer: 38 mL/min — ABNORMAL LOW (ref >90)
GFR, EST NON AFRICAN AMERICAN: 33 mL/min — AB (ref >90)
Glucose, Bld: 104 mg/dL — ABNORMAL HIGH (ref 70–99)
Potassium: 5.5 mEq/L — ABNORMAL HIGH (ref 3.7–5.3)
SODIUM: 140 meq/L (ref 137–147)
TOTAL PROTEIN: 6.7 g/dL (ref 6.0–8.3)
Total Bilirubin: 0.4 mg/dL (ref 0.3–1.2)

## 2013-11-13 LAB — PROTIME-INR
INR: 0.99 (ref 0.00–1.49)
PROTHROMBIN TIME: 12.9 s (ref 11.6–15.2)

## 2013-11-13 LAB — BLOOD GAS, ARTERIAL
ACID-BASE DEFICIT: 5.5 mmol/L — AB (ref 0.0–2.0)
Bicarbonate: 19.2 mEq/L — ABNORMAL LOW (ref 20.0–24.0)
DRAWN BY: 206361
FIO2: 0.21 %
O2 Saturation: 98.4 %
PATIENT TEMPERATURE: 98.6
TCO2: 20.3 mmol/L (ref 0–100)
pCO2 arterial: 36.6 mmHg (ref 35.0–45.0)
pH, Arterial: 7.339 — ABNORMAL LOW (ref 7.350–7.450)
pO2, Arterial: 110 mmHg — ABNORMAL HIGH (ref 80.0–100.0)

## 2013-11-13 LAB — URINALYSIS, ROUTINE W REFLEX MICROSCOPIC
BILIRUBIN URINE: NEGATIVE
Glucose, UA: NEGATIVE mg/dL
HGB URINE DIPSTICK: NEGATIVE
KETONES UR: NEGATIVE mg/dL
Leukocytes, UA: NEGATIVE
NITRITE: NEGATIVE
PROTEIN: 30 mg/dL — AB
Specific Gravity, Urine: 1.015 (ref 1.005–1.030)
Urobilinogen, UA: 1 mg/dL (ref 0.0–1.0)
pH: 5.5 (ref 5.0–8.0)

## 2013-11-13 LAB — URINE MICROSCOPIC-ADD ON

## 2013-11-13 LAB — ABO/RH: ABO/RH(D): O POS

## 2013-11-13 LAB — CBC
HCT: 38.8 % — ABNORMAL LOW (ref 39.0–52.0)
Hemoglobin: 13.3 g/dL (ref 13.0–17.0)
MCH: 31.4 pg (ref 26.0–34.0)
MCHC: 34.3 g/dL (ref 30.0–36.0)
MCV: 91.5 fL (ref 78.0–100.0)
Platelets: 156 10*3/uL (ref 150–400)
RBC: 4.24 MIL/uL (ref 4.22–5.81)
RDW: 13.3 % (ref 11.5–15.5)
WBC: 7.9 10*3/uL (ref 4.0–10.5)

## 2013-11-13 LAB — HEMOGLOBIN A1C
HEMOGLOBIN A1C: 5.5 % (ref <5.7)
MEAN PLASMA GLUCOSE: 111 mg/dL (ref <117)

## 2013-11-13 LAB — SURGICAL PCR SCREEN
MRSA, PCR: NEGATIVE
STAPHYLOCOCCUS AUREUS: NEGATIVE

## 2013-11-13 LAB — APTT: APTT: 28 s (ref 24–37)

## 2013-11-13 NOTE — Progress Notes (Signed)
11/13/13 0957  OBSTRUCTIVE SLEEP APNEA  Have you ever been diagnosed with sleep apnea through a sleep study? No  Do you snore loudly (loud enough to be heard through closed doors)?  0  Do you often feel tired, fatigued, or sleepy during the daytime? 0  Has anyone observed you stop breathing during your sleep? 0  Do you have, or are you being treated for high blood pressure? 1  BMI more than 35 kg/m2? 0  Age over 78 years old? 1  Neck circumference greater than 40 cm/16 inches? 1 (16)  Gender: 1  Obstructive Sleep Apnea Score 4  Score 4 or greater  Results sent to PCP

## 2013-11-16 ENCOUNTER — Encounter (HOSPITAL_COMMUNITY): Payer: Self-pay | Admitting: Anesthesiology

## 2013-11-16 MED ORDER — NITROGLYCERIN IN D5W 200-5 MCG/ML-% IV SOLN
2.0000 ug/min | INTRAVENOUS | Status: DC
  Filled 2013-11-16: qty 250

## 2013-11-16 MED ORDER — VANCOMYCIN HCL 10 G IV SOLR
1250.0000 mg | INTRAVENOUS | Status: AC
  Administered 2013-11-17: 1250 mg via INTRAVENOUS
  Filled 2013-11-16: qty 1250

## 2013-11-16 MED ORDER — SODIUM CHLORIDE 0.9 % IV SOLN
INTRAVENOUS | Status: DC
  Filled 2013-11-16: qty 30

## 2013-11-16 MED ORDER — DOPAMINE-DEXTROSE 3.2-5 MG/ML-% IV SOLN
2.0000 ug/kg/min | INTRAVENOUS | Status: DC
  Filled 2013-11-16: qty 250

## 2013-11-16 MED ORDER — DEXTROSE 5 % IV SOLN
0.0000 ug/min | INTRAVENOUS | Status: DC
  Filled 2013-11-16: qty 8

## 2013-11-16 MED ORDER — DEXTROSE 5 % IV SOLN
1.5000 g | INTRAVENOUS | Status: AC
  Administered 2013-11-17: 1.5 g via INTRAVENOUS
  Filled 2013-11-16: qty 1.5

## 2013-11-16 MED ORDER — PHENYLEPHRINE HCL 10 MG/ML IJ SOLN
30.0000 ug/min | INTRAVENOUS | Status: DC
  Filled 2013-11-16: qty 2

## 2013-11-16 MED ORDER — MAGNESIUM SULFATE 50 % IJ SOLN
40.0000 meq | INTRAMUSCULAR | Status: DC
  Filled 2013-11-16: qty 10

## 2013-11-16 MED ORDER — INSULIN REGULAR HUMAN 100 UNIT/ML IJ SOLN
INTRAMUSCULAR | Status: DC
  Filled 2013-11-16: qty 1

## 2013-11-16 MED ORDER — POTASSIUM CHLORIDE 2 MEQ/ML IV SOLN
80.0000 meq | INTRAVENOUS | Status: DC
  Filled 2013-11-16: qty 40

## 2013-11-16 MED ORDER — DEXMEDETOMIDINE HCL IN NACL 400 MCG/100ML IV SOLN
0.1000 ug/kg/h | INTRAVENOUS | Status: DC
  Filled 2013-11-16: qty 100

## 2013-11-16 MED ORDER — EPINEPHRINE HCL 1 MG/ML IJ SOLN
0.5000 ug/min | INTRAMUSCULAR | Status: DC
  Filled 2013-11-16: qty 4

## 2013-11-16 MED ORDER — METOPROLOL TARTRATE 12.5 MG HALF TABLET: 12.5000 mg | ORAL_TABLET | Freq: Once | ORAL | Status: DC

## 2013-11-16 NOTE — Progress Notes (Signed)
Anesthesia Chart Review:  Patient is a 80 year old male scheduled for TAVR, transfemoral approach on 11/17/13 by Dr. Excell Seltzerooper.   History includes severe AS, CAD/NSTEMI s/p CABG (LIMA to LAD, SVG to OM1, SVG to distal RCA) '04, HLD, HTN, CKD, arthritis, left TKA, UHR, non-smoker. OSA screening score was 4. PCP is Dr. Olena LeatherwoodHasanaj.  Primary cardiologist is Dr. Diona BrownerMcDowell.    Cardiac cath on 10/13/13 showed:  1. Severe double vessel coronary artery disease with severe stenosis of the proximal LAD and total occlusion of the native RCA  2. Patency of the left circumflex without significant stenosis.  3. Status post aortocoronary bypass surgery with continued patency of the LIMA to LAD, saphenous vein graft to diagonal, and saphenous vein graft to distal RCA  4. Total occlusion of the saphenous vein graft to obtuse marginal  5. Severe aortic stenosis   Echo on 09/10/13 showed: Mild LVH with LVEF 65-70%, grade 1 diastolic dysfunction. Severe left atrial enlargement. MAC with mild mitral regurgitation. Severe calcific aortic stenosis with mild aortic regurgitation. Mean gradient: 43mm Hg (S). Peak gradient: 70mm Hg (S). VTI ratio of LVOT to aortic valve: 0.29. Valve area: 0.92cm^2(VTI). Valve area: 0.82cm^2 (Vmax). Trivial tricuspid regurgitation with RV-RA gradient 27 mmHg.  Preoperative EKG, CXR, PFTs, cardiac CT noted.    Preoperative labs noted.  K+ 5.5, BUN/CR 44/1.83. Overall, renal function appears stable.  Glucose 104.  H/H 13.3/38.8. PT/PTT WNL.  A1C 5.5.  I'll order an ISTAT8 for the day of surgery to ensure stability of hyperkalemia and renal function.  If results acceptable and otherwise no acute changes then I would anticipate that he could proceed as planned.  Velna Ochsllison Norinne Jeane, PA-C [facility] Short Stay Center/Anesthesiology Phone [phone] 11/16/2013 9:37 AM

## 2013-11-17 ENCOUNTER — Inpatient Hospital Stay (HOSPITAL_COMMUNITY)
Admission: RE | Admit: 2013-11-17 | Discharge: 2013-11-21 | DRG: 267 | Disposition: A | Discharge status: Home / Self Care | Payer: Medicare PPO | Source: Ambulatory Visit | Attending: Cardiovascular Disease | Admitting: Cardiovascular Disease

## 2013-11-17 ENCOUNTER — Inpatient Hospital Stay (HOSPITAL_COMMUNITY): Payer: Medicare PPO

## 2013-11-17 ENCOUNTER — Encounter (HOSPITAL_COMMUNITY)
Admission: RE | Disposition: A | Discharge status: Home / Self Care | Payer: Medicare PPO | Source: Ambulatory Visit | Attending: Cardiovascular Disease

## 2013-11-17 ENCOUNTER — Encounter (HOSPITAL_COMMUNITY): Payer: Medicare PPO | Admitting: Anesthesiology

## 2013-11-17 ENCOUNTER — Inpatient Hospital Stay (HOSPITAL_COMMUNITY): Payer: Medicare PPO | Admitting: Anesthesiology

## 2013-11-17 ENCOUNTER — Encounter (HOSPITAL_COMMUNITY): Payer: Self-pay | Admitting: Critical Care Medicine

## 2013-11-17 DIAGNOSIS — I10 Essential (primary) hypertension: Secondary | ICD-10-CM

## 2013-11-17 DIAGNOSIS — I252 Old myocardial infarction: Secondary | ICD-10-CM

## 2013-11-17 DIAGNOSIS — I5032 Chronic diastolic (congestive) heart failure: Secondary | ICD-10-CM | POA: Diagnosis present

## 2013-11-17 DIAGNOSIS — N189 Chronic kidney disease, unspecified: Secondary | ICD-10-CM

## 2013-11-17 DIAGNOSIS — K5900 Constipation, unspecified: Secondary | ICD-10-CM

## 2013-11-17 DIAGNOSIS — Z79899 Other long term (current) drug therapy: Secondary | ICD-10-CM

## 2013-11-17 DIAGNOSIS — I359 Nonrheumatic aortic valve disorder, unspecified: Principal | ICD-10-CM | POA: Diagnosis present

## 2013-11-17 DIAGNOSIS — Z951 Presence of aortocoronary bypass graft: Secondary | ICD-10-CM

## 2013-11-17 DIAGNOSIS — N183 Chronic kidney disease, stage 3 unspecified: Secondary | ICD-10-CM | POA: Diagnosis present

## 2013-11-17 DIAGNOSIS — Z7982 Long term (current) use of aspirin: Secondary | ICD-10-CM

## 2013-11-17 DIAGNOSIS — Z952 Presence of prosthetic heart valve: Secondary | ICD-10-CM

## 2013-11-17 DIAGNOSIS — I4891 Unspecified atrial fibrillation: Secondary | ICD-10-CM | POA: Diagnosis present

## 2013-11-17 DIAGNOSIS — I2510 Atherosclerotic heart disease of native coronary artery without angina pectoris: Secondary | ICD-10-CM | POA: Diagnosis present

## 2013-11-17 DIAGNOSIS — Z006 Encounter for examination for normal comparison and control in clinical research program: Secondary | ICD-10-CM

## 2013-11-17 DIAGNOSIS — I129 Hypertensive chronic kidney disease with stage 1 through stage 4 chronic kidney disease, or unspecified chronic kidney disease: Secondary | ICD-10-CM | POA: Diagnosis present

## 2013-11-17 DIAGNOSIS — I498 Other specified cardiac arrhythmias: Secondary | ICD-10-CM | POA: Diagnosis present

## 2013-11-17 DIAGNOSIS — E782 Mixed hyperlipidemia: Secondary | ICD-10-CM | POA: Diagnosis present

## 2013-11-17 DIAGNOSIS — Z9849 Cataract extraction status, unspecified eye: Secondary | ICD-10-CM

## 2013-11-17 DIAGNOSIS — I350 Nonrheumatic aortic (valve) stenosis: Secondary | ICD-10-CM

## 2013-11-17 HISTORY — PX: INTRAOPERATIVE TRANSESOPHAGEAL ECHOCARDIOGRAM: SHX5062

## 2013-11-17 HISTORY — PX: TRANSCATHETER AORTIC VALVE REPLACEMENT, TRANSFEMORAL: SHX6400

## 2013-11-17 HISTORY — DX: Presence of prosthetic heart valve: Z95.2

## 2013-11-17 LAB — GLUCOSE, CAPILLARY
GLUCOSE-CAPILLARY: 91 mg/dL (ref 70–99)
GLUCOSE-CAPILLARY: 80 mg/dL (ref 70–99)
GLUCOSE-CAPILLARY: 90 mg/dL (ref 70–99)
Glucose-Capillary: 85 mg/dL (ref 70–99)
Glucose-Capillary: 87 mg/dL (ref 70–99)

## 2013-11-17 LAB — POCT I-STAT, CHEM 8
BUN: 41 mg/dL — ABNORMAL HIGH (ref 6–23)
BUN: 30 mg/dL — ABNORMAL HIGH (ref 6–23)
CALCIUM ION: 1.27 mmol/L (ref 1.13–1.30)
CALCIUM ION: 1.31 mmol/L — AB (ref 1.13–1.30)
CHLORIDE: 111 meq/L (ref 96–112)
CREATININE: 1.7 mg/dL — AB (ref 0.50–1.35)
Chloride: 112 mEq/L (ref 96–112)
Creatinine, Ser: 2.1 mg/dL — ABNORMAL HIGH (ref 0.50–1.35)
GLUCOSE: 101 mg/dL — AB (ref 70–99)
Glucose, Bld: 106 mg/dL — ABNORMAL HIGH (ref 70–99)
HCT: 35 % — ABNORMAL LOW (ref 39.0–52.0)
HEMATOCRIT: 31 % — AB (ref 39.0–52.0)
Hemoglobin: 11.9 g/dL — ABNORMAL LOW (ref 13.0–17.0)
Hemoglobin: 10.5 g/dL — ABNORMAL LOW (ref 13.0–17.0)
Potassium: 5.2 mEq/L (ref 3.7–5.3)
Potassium: 4.4 mEq/L (ref 3.7–5.3)
Sodium: 142 mEq/L (ref 137–147)
Sodium: 141 mEq/L (ref 137–147)
TCO2: 18 mmol/L (ref 0–100)
TCO2: 19 mmol/L (ref 0–100)

## 2013-11-17 LAB — POCT I-STAT 3, ART BLOOD GAS (G3+)
Acid-base deficit: 6 mmol/L — ABNORMAL HIGH (ref 0.0–2.0)
Bicarbonate: 20.2 mEq/L (ref 20.0–24.0)
O2 Saturation: 99 %
PCO2 ART: 37.4 mmHg (ref 35.0–45.0)
PO2 ART: 141 mmHg — AB (ref 80.0–100.0)
Patient temperature: 35.7
TCO2: 21 mmol/L (ref 0–100)
pH, Arterial: 7.333 — ABNORMAL LOW (ref 7.350–7.450)

## 2013-11-17 LAB — POCT I-STAT 4, (NA,K, GLUC, HGB,HCT)
GLUCOSE: 108 mg/dL — AB (ref 70–99)
HEMATOCRIT: 28 % — AB (ref 39.0–52.0)
Hemoglobin: 9.5 g/dL — ABNORMAL LOW (ref 13.0–17.0)
POTASSIUM: 4.2 meq/L (ref 3.7–5.3)
SODIUM: 141 meq/L (ref 137–147)

## 2013-11-17 LAB — CBC
HCT: 30.7 % — ABNORMAL LOW (ref 39.0–52.0)
HCT: 32.7 % — ABNORMAL LOW (ref 39.0–52.0)
HEMOGLOBIN: 10.5 g/dL — AB (ref 13.0–17.0)
Hemoglobin: 11.2 g/dL — ABNORMAL LOW (ref 13.0–17.0)
MCH: 30.9 pg (ref 26.0–34.0)
MCH: 31.2 pg (ref 26.0–34.0)
MCHC: 34.2 g/dL (ref 30.0–36.0)
MCHC: 34.3 g/dL (ref 30.0–36.0)
MCV: 90.3 fL (ref 78.0–100.0)
MCV: 91.1 fL (ref 78.0–100.0)
PLATELETS: 99 10*3/uL — AB (ref 150–400)
Platelets: 97 10*3/uL — ABNORMAL LOW (ref 150–400)
RBC: 3.4 MIL/uL — ABNORMAL LOW (ref 4.22–5.81)
RBC: 3.59 MIL/uL — AB (ref 4.22–5.81)
RDW: 13.1 % (ref 11.5–15.5)
RDW: 13.1 % (ref 11.5–15.5)
WBC: 5.5 10*3/uL (ref 4.0–10.5)
WBC: 7 10*3/uL (ref 4.0–10.5)

## 2013-11-17 LAB — PROTIME-INR
INR: 1.27 (ref 0.00–1.49)
Prothrombin Time: 15.6 seconds — ABNORMAL HIGH (ref 11.6–15.2)

## 2013-11-17 LAB — CREATININE, SERUM
Creatinine, Ser: 1.64 mg/dL — ABNORMAL HIGH (ref 0.50–1.35)
GFR, EST AFRICAN AMERICAN: 43 mL/min — AB (ref >90)
GFR, EST NON AFRICAN AMERICAN: 37 mL/min — AB (ref >90)

## 2013-11-17 LAB — APTT: aPTT: 35 seconds (ref 24–37)

## 2013-11-17 LAB — MAGNESIUM: Magnesium: 1.9 mg/dL (ref 1.5–2.5)

## 2013-11-17 LAB — PREPARE RBC (CROSSMATCH)

## 2013-11-17 SURGERY — IMPLANTATION, AORTIC VALVE, TRANSCATHETER, FEMORAL APPROACH
Anesthesia: General | Site: Chest

## 2013-11-17 MED ORDER — HEPARIN SODIUM (PORCINE) 1000 UNIT/ML IJ SOLN
INTRAMUSCULAR | Status: DC | PRN
  Administered 2013-11-17: 9 mL via INTRAVENOUS

## 2013-11-17 MED ORDER — ROCURONIUM BROMIDE 50 MG/5ML IV SOLN
INTRAVENOUS | Status: AC
  Filled 2013-11-17: qty 1

## 2013-11-17 MED ORDER — FENTANYL CITRATE 0.05 MG/ML IJ SOLN
INTRAMUSCULAR | Status: AC
  Filled 2013-11-17: qty 2

## 2013-11-17 MED ORDER — LACTATED RINGERS IV SOLN: 500.0000 mL | Freq: Once | INTRAVENOUS | Status: AC | PRN

## 2013-11-17 MED ORDER — LACTATED RINGERS IV SOLN
INTRAVENOUS | Status: DC | PRN
  Administered 2013-11-17: 11:00:00 via INTRAVENOUS

## 2013-11-17 MED ORDER — MIDAZOLAM HCL 2 MG/2ML IJ SOLN
1.0000 mg | INTRAMUSCULAR | Status: DC | PRN
  Administered 2013-11-17: 2 mg via INTRAVENOUS
  Filled 2013-11-17: qty 2

## 2013-11-17 MED ORDER — POTASSIUM CHLORIDE 10 MEQ/50ML IV SOLN: 10.0000 meq | INTRAVENOUS | Status: DC

## 2013-11-17 MED ORDER — INSULIN ASPART 100 UNIT/ML ~~LOC~~ SOLN: 0.0000 [IU] | SUBCUTANEOUS | Status: DC

## 2013-11-17 MED ORDER — ROCURONIUM BROMIDE 100 MG/10ML IV SOLN
INTRAVENOUS | Status: DC | PRN
  Administered 2013-11-17: 50 mg via INTRAVENOUS

## 2013-11-17 MED ORDER — FENTANYL CITRATE 0.05 MG/ML IJ SOLN
INTRAMUSCULAR | Status: DC | PRN
  Administered 2013-11-17: 100 ug via INTRAVENOUS

## 2013-11-17 MED ORDER — SODIUM CHLORIDE 0.9 % IV SOLN
200.0000 ug | INTRAVENOUS | Status: DC | PRN
  Administered 2013-11-17: .5 ug/kg/h via INTRAVENOUS

## 2013-11-17 MED ORDER — DEXTROSE 5 % IV SOLN
1.5000 g | Freq: Two times a day (BID) | INTRAVENOUS | Status: DC
  Administered 2013-11-17 – 2013-11-18 (×2): 1.5 g via INTRAVENOUS
  Filled 2013-11-17 (×3): qty 1.5

## 2013-11-17 MED ORDER — CLOPIDOGREL BISULFATE 75 MG PO TABS
75.0000 mg | ORAL_TABLET | Freq: Every day | ORAL | Status: DC
  Administered 2013-11-18: 75 mg via ORAL
  Filled 2013-11-17 (×2): qty 1

## 2013-11-17 MED ORDER — LACTATED RINGERS IV SOLN
INTRAVENOUS | Status: DC | PRN
Start: 2013-11-17 — End: 2013-11-17
  Administered 2013-11-17: 10:00:00 via INTRAVENOUS

## 2013-11-17 MED ORDER — FAMOTIDINE 20 MG PO TABS: 20.0000 mg | ORAL_TABLET | Freq: Two times a day (BID) | ORAL | Status: DC | PRN

## 2013-11-17 MED ORDER — IODIXANOL 320 MG/ML IV SOLN
INTRAVENOUS | Status: DC | PRN
  Administered 2013-11-17: 56 mL via INTRAVENOUS

## 2013-11-17 MED ORDER — HEPARIN SODIUM (PORCINE) 5000 UNIT/ML IJ SOLN
INTRAMUSCULAR | Status: DC | PRN
  Administered 2013-11-17 (×3)

## 2013-11-17 MED ORDER — NEOSTIGMINE METHYLSULFATE 10 MG/10ML IV SOLN
INTRAVENOUS | Status: AC
  Filled 2013-11-17: qty 1

## 2013-11-17 MED ORDER — SODIUM CHLORIDE 0.9 % IV SOLN: INTRAVENOUS | Status: DC

## 2013-11-17 MED ORDER — SIMVASTATIN 40 MG PO TABS
40.0000 mg | ORAL_TABLET | Freq: Every evening | ORAL | Status: DC
  Administered 2013-11-17 – 2013-11-18 (×2): 40 mg via ORAL
  Filled 2013-11-17 (×3): qty 1

## 2013-11-17 MED ORDER — HYDRALAZINE HCL 20 MG/ML IJ SOLN
INTRAMUSCULAR | Status: DC | PRN
  Administered 2013-11-17: 8 mg via INTRAVENOUS

## 2013-11-17 MED ORDER — SODIUM CHLORIDE 0.9 % IV SOLN
100.0000 [IU] | INTRAVENOUS | Status: DC | PRN
  Administered 2013-11-17: 1 [IU]/h via INTRAVENOUS

## 2013-11-17 MED ORDER — LACTATED RINGERS IV SOLN
INTRAVENOUS | Status: DC
  Administered 2013-11-17: 09:00:00 via INTRAVENOUS

## 2013-11-17 MED ORDER — ACETAMINOPHEN 500 MG PO TABS
1000.0000 mg | ORAL_TABLET | Freq: Four times a day (QID) | ORAL | Status: DC
  Administered 2013-11-18 (×3): 1000 mg via ORAL
  Filled 2013-11-17 (×6): qty 2

## 2013-11-17 MED ORDER — PROPOFOL 10 MG/ML IV BOLUS
INTRAVENOUS | Status: AC
  Filled 2013-11-17: qty 20

## 2013-11-17 MED ORDER — ACETAMINOPHEN 325 MG PO TABS: 650.0000 mg | ORAL_TABLET | Freq: Four times a day (QID) | ORAL | Status: DC | PRN

## 2013-11-17 MED ORDER — ALBUMIN HUMAN 5 % IV SOLN: 250.0000 mL | INTRAVENOUS | Status: DC | PRN

## 2013-11-17 MED ORDER — METOPROLOL TARTRATE 12.5 MG HALF TABLET
12.5000 mg | ORAL_TABLET | Freq: Two times a day (BID) | ORAL | Status: DC
Start: 2013-11-17 — End: 2013-11-18
  Filled 2013-11-17 (×3): qty 1

## 2013-11-17 MED ORDER — ONDANSETRON HCL 4 MG/2ML IJ SOLN
INTRAMUSCULAR | Status: DC | PRN
  Administered 2013-11-17: 4 mg via INTRAVENOUS

## 2013-11-17 MED ORDER — ACETAMINOPHEN 160 MG/5ML PO SOLN: 1000.0000 mg | Freq: Four times a day (QID) | ORAL | Status: DC

## 2013-11-17 MED ORDER — MIDAZOLAM HCL 2 MG/2ML IJ SOLN
INTRAMUSCULAR | Status: AC
  Filled 2013-11-17: qty 2

## 2013-11-17 MED ORDER — CHLORHEXIDINE GLUCONATE CLOTH 2 % EX PADS: 6.0000 | MEDICATED_PAD | Freq: Once | CUTANEOUS | Status: DC

## 2013-11-17 MED ORDER — NITROGLYCERIN IN D5W 200-5 MCG/ML-% IV SOLN
INTRAVENOUS | Status: DC | PRN
  Administered 2013-11-17: 10 ug/min via INTRAVENOUS

## 2013-11-17 MED ORDER — SODIUM CHLORIDE 0.9 % IJ SOLN: 3.0000 mL | INTRAMUSCULAR | Status: DC | PRN

## 2013-11-17 MED ORDER — MAGNESIUM SULFATE 4000MG/100ML IJ SOLN: 4.0000 g | Freq: Once | INTRAMUSCULAR | Status: DC

## 2013-11-17 MED ORDER — SODIUM CHLORIDE 0.9 % IV SOLN: 250.0000 mL | INTRAVENOUS | Status: DC | PRN

## 2013-11-17 MED ORDER — SODIUM CHLORIDE 0.45 % IV SOLN
INTRAVENOUS | Status: DC
  Administered 2013-11-17: 16:00:00 via INTRAVENOUS

## 2013-11-17 MED ORDER — OXYCODONE HCL 5 MG PO TABS
5.0000 mg | ORAL_TABLET | ORAL | Status: DC | PRN
  Filled 2013-11-17: qty 1

## 2013-11-17 MED ORDER — FENTANYL CITRATE 0.05 MG/ML IJ SOLN
50.0000 ug | INTRAMUSCULAR | Status: DC | PRN
  Administered 2013-11-17: 100 ug via INTRAVENOUS
  Filled 2013-11-17: qty 2

## 2013-11-17 MED ORDER — LACTATED RINGERS IV SOLN
INTRAVENOUS | Status: DC | PRN
Start: 2013-11-17 — End: 2013-11-17
  Administered 2013-11-17 (×2): via INTRAVENOUS

## 2013-11-17 MED ORDER — INSULIN REGULAR BOLUS VIA INFUSION
0.0000 [IU] | Freq: Three times a day (TID) | INTRAVENOUS | Status: DC
  Filled 2013-11-17: qty 10

## 2013-11-17 MED ORDER — NEOSTIGMINE METHYLSULFATE 10 MG/10ML IV SOLN
INTRAVENOUS | Status: DC | PRN
  Administered 2013-11-17: 4 mg via INTRAVENOUS

## 2013-11-17 MED ORDER — PROTAMINE SULFATE 10 MG/ML IV SOLN
INTRAVENOUS | Status: DC | PRN
  Administered 2013-11-17: 90 mg via INTRAVENOUS

## 2013-11-17 MED ORDER — ACETAMINOPHEN 160 MG/5ML PO SOLN
650.0000 mg | Freq: Once | ORAL | Status: AC
  Administered 2013-11-17: 650 mg
  Filled 2013-11-17: qty 20.3

## 2013-11-17 MED ORDER — ARTIFICIAL TEARS OP OINT
TOPICAL_OINTMENT | OPHTHALMIC | Status: AC
  Filled 2013-11-17: qty 3.5

## 2013-11-17 MED ORDER — 0.9 % SODIUM CHLORIDE (POUR BTL) OPTIME
TOPICAL | Status: DC | PRN
  Administered 2013-11-17: 6000 mL

## 2013-11-17 MED ORDER — VANCOMYCIN HCL IN DEXTROSE 1-5 GM/200ML-% IV SOLN
1000.0000 mg | Freq: Once | INTRAVENOUS | Status: AC
  Administered 2013-11-17: 1000 mg via INTRAVENOUS
  Filled 2013-11-17: qty 200

## 2013-11-17 MED ORDER — SODIUM CHLORIDE 0.9 % IV SOLN
INTRAVENOUS | Status: DC
  Filled 2013-11-17: qty 1

## 2013-11-17 MED ORDER — ONDANSETRON HCL 4 MG/2ML IJ SOLN
INTRAMUSCULAR | Status: AC
  Filled 2013-11-17: qty 2

## 2013-11-17 MED ORDER — SODIUM CHLORIDE 0.9 % IV SOLN
1.0000 mL/kg/h | INTRAVENOUS | Status: AC
  Administered 2013-11-17: 1 mL/kg/h via INTRAVENOUS

## 2013-11-17 MED ORDER — SODIUM CHLORIDE 0.9 % IJ SOLN
3.0000 mL | Freq: Two times a day (BID) | INTRAMUSCULAR | Status: DC
Start: 2013-11-17 — End: 2013-11-18
  Administered 2013-11-17: 3 mL via INTRAVENOUS
  Administered 2013-11-18: 6 mL via INTRAVENOUS

## 2013-11-17 MED ORDER — METOPROLOL TARTRATE 25 MG/10 ML ORAL SUSPENSION
12.5000 mg | Freq: Two times a day (BID) | ORAL | Status: DC
  Filled 2013-11-17 (×3): qty 5

## 2013-11-17 MED ORDER — ASPIRIN EC 81 MG PO TBEC
81.0000 mg | DELAYED_RELEASE_TABLET | Freq: Every day | ORAL | Status: DC
  Administered 2013-11-18: 81 mg via ORAL
  Filled 2013-11-17: qty 1

## 2013-11-17 MED ORDER — MIDAZOLAM HCL 2 MG/2ML IJ SOLN: 2.0000 mg | INTRAMUSCULAR | Status: DC | PRN

## 2013-11-17 MED ORDER — GLYCOPYRROLATE 0.2 MG/ML IJ SOLN
INTRAMUSCULAR | Status: DC | PRN
  Administered 2013-11-17: .8 mg via INTRAVENOUS

## 2013-11-17 MED ORDER — PROPOFOL 10 MG/ML IV BOLUS
INTRAVENOUS | Status: DC | PRN
  Administered 2013-11-17: 70 mg via INTRAVENOUS

## 2013-11-17 MED ORDER — CHLORHEXIDINE GLUCONATE 4 % EX LIQD
30.0000 mL | CUTANEOUS | Status: DC
  Filled 2013-11-17: qty 30

## 2013-11-17 MED ORDER — MORPHINE SULFATE 2 MG/ML IJ SOLN: 1.0000 mg | INTRAMUSCULAR | Status: DC | PRN

## 2013-11-17 MED ORDER — METOPROLOL TARTRATE 1 MG/ML IV SOLN: 2.5000 mg | INTRAVENOUS | Status: DC | PRN

## 2013-11-17 MED ORDER — GLYCOPYRROLATE 0.2 MG/ML IJ SOLN
INTRAMUSCULAR | Status: AC
  Filled 2013-11-17: qty 2

## 2013-11-17 MED ORDER — FENTANYL CITRATE 0.05 MG/ML IJ SOLN
INTRAMUSCULAR | Status: AC
  Filled 2013-11-17: qty 5

## 2013-11-17 MED ORDER — HEPARIN SODIUM (PORCINE) 1000 UNIT/ML IJ SOLN
INTRAMUSCULAR | Status: AC
  Filled 2013-11-17: qty 1

## 2013-11-17 MED ORDER — ACETAMINOPHEN 650 MG RE SUPP: 650.0000 mg | Freq: Once | RECTAL | Status: AC

## 2013-11-17 MED ORDER — FAMOTIDINE IN NACL 20-0.9 MG/50ML-% IV SOLN
20.0000 mg | Freq: Two times a day (BID) | INTRAVENOUS | Status: DC
  Administered 2013-11-17: 20 mg via INTRAVENOUS

## 2013-11-17 MED ORDER — LIDOCAINE HCL (CARDIAC) 20 MG/ML IV SOLN
INTRAVENOUS | Status: DC | PRN
  Administered 2013-11-17 (×2): 100 mg via INTRAVENOUS

## 2013-11-17 MED ORDER — ONDANSETRON HCL 4 MG/2ML IJ SOLN
INTRAMUSCULAR | Status: DC | PRN
Start: 2013-11-17 — End: 2013-11-17
  Administered 2013-11-17: 4 mg via INTRAVENOUS

## 2013-11-17 MED ORDER — MORPHINE SULFATE 2 MG/ML IJ SOLN: 2.0000 mg | INTRAMUSCULAR | Status: DC | PRN

## 2013-11-17 MED ORDER — EPHEDRINE SULFATE 50 MG/ML IJ SOLN
INTRAMUSCULAR | Status: DC | PRN
  Administered 2013-11-17: 5 mg via INTRAVENOUS

## 2013-11-17 MED ORDER — PANTOPRAZOLE SODIUM 40 MG PO TBEC: 40.0000 mg | DELAYED_RELEASE_TABLET | Freq: Every day | ORAL | Status: DC

## 2013-11-17 MED ORDER — PHENYLEPHRINE HCL 10 MG/ML IJ SOLN
0.0000 ug/min | INTRAMUSCULAR | Status: DC
  Filled 2013-11-17: qty 2

## 2013-11-17 MED ORDER — DEXMEDETOMIDINE HCL IN NACL 200 MCG/50ML IV SOLN: 0.1000 ug/kg/h | INTRAVENOUS | Status: DC

## 2013-11-17 MED ORDER — NITROGLYCERIN IN D5W 200-5 MCG/ML-% IV SOLN: 0.0000 ug/min | INTRAVENOUS | Status: DC

## 2013-11-17 MED ORDER — ONDANSETRON HCL 4 MG/2ML IJ SOLN: 4.0000 mg | Freq: Four times a day (QID) | INTRAMUSCULAR | Status: DC | PRN

## 2013-11-17 SURGICAL SUPPLY — 119 items
ADAPTER CARDIOPLEGIA (MISCELLANEOUS) IMPLANT
ADH SKN CLS APL DERMABOND .7 (GAUZE/BANDAGES/DRESSINGS) ×2
ANTEGRADE CPLG (MISCELLANEOUS) IMPLANT
ATTRACTOMAT 16X20 MAGNETIC DRP (DRAPES) IMPLANT
BAG BANDED W/RUBBER/TAPE 36X54 (MISCELLANEOUS) ×4 IMPLANT
BAG DECANTER FOR FLEXI CONT (MISCELLANEOUS) IMPLANT
BAG EQP BAND 135X91 W/RBR TAPE (MISCELLANEOUS) ×2
BAG SNAP BAND KOVER 36X36 (MISCELLANEOUS) ×4 IMPLANT
BLADE 10 SAFETY STRL DISP (BLADE) ×4 IMPLANT
BLADE CORE FAN STRYKER (BLADE) ×2 IMPLANT
BLADE STERNUM SYSTEM 6 (BLADE) ×4 IMPLANT
BLADE SURG ROTATE 9660 (MISCELLANEOUS) IMPLANT
CABLE PACING FASLOC BIEGE (MISCELLANEOUS) ×4 IMPLANT
CABLE PACING FASLOC BLUE (MISCELLANEOUS) ×4 IMPLANT
CANISTER SUCTION 2500CC (MISCELLANEOUS) IMPLANT
CANNULA FEM VENOUS REMOTE 22FR (CANNULA) IMPLANT
CANNULA FEMORAL ART 14 SM (MISCELLANEOUS) IMPLANT
CANNULA GUNDRY RCSP 15FR (MISCELLANEOUS) IMPLANT
CANNULA OPTISITE PERFUSION 16F (CANNULA) IMPLANT
CANNULA OPTISITE PERFUSION 18F (CANNULA) IMPLANT
CANNULA SOFTFLOW AORTIC 7M21FR (CANNULA) IMPLANT
CANNULA VENOUS LOW PROF 34X46 (CANNULA) IMPLANT
CATH DIAG EXPO 6F AL2 (CATHETERS) ×2 IMPLANT
CATH DIAG EXPO 6F VENT PIG 145 (CATHETERS) ×4 IMPLANT
CATH HEART VENT LEFT (CATHETERS) IMPLANT
CATH S G BIP PACING (SET/KITS/TRAYS/PACK) ×4 IMPLANT
CATH SOFT-VU 4F 65 STRAIGHT (CATHETERS) IMPLANT
CATH SOFT-VU STRAIGHT 4F 65CM (CATHETERS) ×4
CONN ST 1/4X3/8  BEN (MISCELLANEOUS)
CONN ST 1/4X3/8 BEN (MISCELLANEOUS) IMPLANT
CONNECTOR 1/2X3/8X1/2 3 WAY (MISCELLANEOUS)
CONNECTOR 1/2X3/8X1/2 3WAY (MISCELLANEOUS) IMPLANT
COVER DOME SNAP 22 D (MISCELLANEOUS) ×4 IMPLANT
COVER MAYO STAND STRL (DRAPES) ×4 IMPLANT
COVER PROBE W GEL 5X96 (DRAPES) IMPLANT
COVER SURGICAL LIGHT HANDLE (MISCELLANEOUS) ×4 IMPLANT
COVER TABLE BACK 60X90 (DRAPES) ×4 IMPLANT
CRADLE DONUT ADULT HEAD (MISCELLANEOUS) ×4 IMPLANT
DERMABOND ADVANCED (GAUZE/BANDAGES/DRESSINGS) ×2
DERMABOND ADVANCED .7 DNX12 (GAUZE/BANDAGES/DRESSINGS) ×2 IMPLANT
DRAIN CHANNEL 28F RND 3/8 FF (WOUND CARE) IMPLANT
DRAIN CHANNEL 32F RND 10.7 FF (WOUND CARE) IMPLANT
DRAPE INCISE IOBAN 66X45 STRL (DRAPES) IMPLANT
DRAPE SLUSH/WARMER DISC (DRAPES) ×4 IMPLANT
DRAPE TABLE COVER HEAVY DUTY (DRAPES) ×4 IMPLANT
ELECT REM PT RETURN 9FT ADLT (ELECTROSURGICAL) ×8
ELECTRODE REM PT RTRN 9FT ADLT (ELECTROSURGICAL) ×4 IMPLANT
FELT TEFLON 6X6 (MISCELLANEOUS) ×4 IMPLANT
FEMORAL VENOUS CANN RAP (CANNULA) IMPLANT
GLOVE ECLIPSE 7.5 STRL STRAW (GLOVE) ×4 IMPLANT
GLOVE ECLIPSE 8.0 STRL XLNG CF (GLOVE) ×8 IMPLANT
GLOVE EUDERMIC 7 POWDERFREE (GLOVE) ×4 IMPLANT
GLOVE ORTHO TXT STRL SZ7.5 (GLOVE) ×4 IMPLANT
GOWN STRL REUS W/ TWL LRG LVL3 (GOWN DISPOSABLE) ×6 IMPLANT
GOWN STRL REUS W/ TWL XL LVL3 (GOWN DISPOSABLE) ×12 IMPLANT
GOWN STRL REUS W/TWL LRG LVL3 (GOWN DISPOSABLE) ×12
GOWN STRL REUS W/TWL XL LVL3 (GOWN DISPOSABLE) ×24
GUIDEWIRE SAF TJ AMPL .035X180 (WIRE) ×4 IMPLANT
GUIDEWIRE SAFE TJ AMPLATZ EXST (WIRE) ×2 IMPLANT
HEMOSTAT POWDER SURGIFOAM 1G (HEMOSTASIS) IMPLANT
INSERT FOGARTY 61MM (MISCELLANEOUS) IMPLANT
INSERT FOGARTY XLG (MISCELLANEOUS) IMPLANT
KIT BASIN OR (CUSTOM PROCEDURE TRAY) ×4 IMPLANT
KIT DILATOR VASC 18G NDL (KITS) IMPLANT
KIT ENCORE 26 ADVANTAGE (KITS) ×2 IMPLANT
KIT HEART LEFT (KITS) ×2 IMPLANT
KIT ROOM TURNOVER OR (KITS) ×4 IMPLANT
KIT SUCTION CATH 14FR (SUCTIONS) ×8 IMPLANT
LEAD PACING MYOCARDI (MISCELLANEOUS) IMPLANT
NDL PERC 18GX7CM (NEEDLE) ×2 IMPLANT
NEEDLE PERC 18GX7CM (NEEDLE) ×4 IMPLANT
NS IRRIG 1000ML POUR BTL (IV SOLUTION) ×12 IMPLANT
PACK AORTA (CUSTOM PROCEDURE TRAY) ×4 IMPLANT
PAD ARMBOARD 7.5X6 YLW CONV (MISCELLANEOUS) ×8 IMPLANT
PAD ELECT DEFIB RADIOL ZOLL (MISCELLANEOUS) ×4 IMPLANT
PATCH TACHOSII LRG 9.5X4.8 (VASCULAR PRODUCTS) IMPLANT
SET CANNULATION TOURNIQUET (MISCELLANEOUS) IMPLANT
SHEATH PINNACLE 6F 10CM (SHEATH) ×4 IMPLANT
SPONGE GAUZE 4X4 12PLY (GAUZE/BANDAGES/DRESSINGS) ×4 IMPLANT
SPONGE GAUZE 4X4 12PLY STER LF (GAUZE/BANDAGES/DRESSINGS) ×2 IMPLANT
SPONGE LAP 4X18 X RAY DECT (DISPOSABLE) ×4 IMPLANT
STOPCOCK MORSE 400PSI 3WAY (MISCELLANEOUS) ×6 IMPLANT
SUT ETHIBOND 2 0 SH (SUTURE) ×4
SUT ETHIBOND 2 0 SH 36X2 (SUTURE) ×2 IMPLANT
SUT ETHIBOND X763 2 0 SH 1 (SUTURE) ×8 IMPLANT
SUT MNCRL AB 3-0 PS2 18 (SUTURE) ×4 IMPLANT
SUT PDS AB 1 CTX 36 (SUTURE) IMPLANT
SUT PROLENE 2 0 MH 48 (SUTURE) IMPLANT
SUT PROLENE 3 0 SH1 36 (SUTURE) IMPLANT
SUT PROLENE 4 0 RB 1 (SUTURE)
SUT PROLENE 4-0 RB1 .5 CRCL 36 (SUTURE) IMPLANT
SUT PROLENE 5 0 C 1 36 (SUTURE) ×12 IMPLANT
SUT PROLENE 6 0 C 1 30 (SUTURE) ×12 IMPLANT
SUT SILK  1 MH (SUTURE) ×2
SUT SILK 1 MH (SUTURE) ×2 IMPLANT
SUT SILK 2 0 SH CR/8 (SUTURE) ×4 IMPLANT
SUT TEM PAC WIRE 2 0 SH (SUTURE) IMPLANT
SUT VIC AB 2-0 CT1 27 (SUTURE) ×4
SUT VIC AB 2-0 CT1 TAPERPNT 27 (SUTURE) IMPLANT
SUT VIC AB 2-0 CTX 36 (SUTURE) IMPLANT
SUT VIC AB 3-0 SH 8-18 (SUTURE) ×8 IMPLANT
SUT VIC AB 3-0 X1 27 (SUTURE) ×2 IMPLANT
SYR 30ML LL (SYRINGE) ×8 IMPLANT
SYR 50ML LL SCALE MARK (SYRINGE) ×4 IMPLANT
SYSTEM SAHARA CHEST DRAIN ATS (WOUND CARE) ×4 IMPLANT
TAPE CLOTH SOFT 2X10 (GAUZE/BANDAGES/DRESSINGS) ×2 IMPLANT
TOWEL OR 17X24 6PK STRL BLUE (TOWEL DISPOSABLE) ×12 IMPLANT
TOWEL OR 17X26 10 PK STRL BLUE (TOWEL DISPOSABLE) ×8 IMPLANT
TRANSDUCER W/STOPCOCK (MISCELLANEOUS) ×4 IMPLANT
TRAY FOLEY IC TEMP SENS 14FR (CATHETERS) ×4 IMPLANT
TUBE SUCT INTRACARD DLP 20F (MISCELLANEOUS) IMPLANT
TUBING ART PRESS 48 MALE/FEM (TUBING) ×2 IMPLANT
TUBING HIGH PRESSURE 120CM (CONNECTOR) ×4 IMPLANT
UNDERPAD 30X30 INCONTINENT (UNDERPADS AND DIAPERS) ×4 IMPLANT
VALVE TRANSFEMORAL HRT 26MM KT (Valve) ×2 IMPLANT
VENT LEFT HEART 12002 (CATHETERS)
WATER STERILE IRR 1000ML POUR (IV SOLUTION) ×8 IMPLANT
WIRE .035 3MM-J 145CM (WIRE) ×2 IMPLANT
WIRE AMPLATZ SS-J .035X180CM (WIRE) ×2 IMPLANT

## 2013-11-17 NOTE — Op Note (Signed)
HEART AND VASCULAR CENTER  TAVR OPERATIVE NOTE   Date of Procedure:  11/17/2013  Preoperative Diagnosis: Severe Aortic Stenosis   Postoperative Diagnosis: Same   Procedure:    Transcatheter Aortic Valve Replacement - Transfemoral Approach  Edwards Sapien XT THV (size 26 mm, model # 9300TFX, serial # [id number])   Co-Surgeons:  Evelene CroonBryan Bartle, MD and Tonny BollmanMichael Anacaren Kohan, MD  Assistants:   Tressie Stalkerlarence Owen, MD and Verne Carrowhristopher McAlhany, MD  Anesthesiologist:  Kipp Broodavid Joslin, MD  Echocardiographer:  Charlton HawsPeter Nishan, MD  Pre-operative Echo Findings:  Severe aortic stenosis  Normal left ventricular systolic function  Post-operative Echo Findings:  1+ paravalvular leak  normal left ventricular systolic function  BRIEF CLINICAL NOTE AND INDICATIONS FOR SURGERY  The patient is an 80 year old gentleman who underwent CABG x 4 by Dr. Dorris FetchHendrickson in 2004 after presenting with a NSTEMI. He had a LIMA to the LAD, SVG to diagonal, SVG to OM1, and a SVG to the distal RCA. He has done well until the past year when he began having chest discomfort, shortness of breath and fatigue with exertion. 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 He has a history of aortic stenosis and had a repeat 2D-echo on 09/10/2013 showing significant progression with a mean transvalvular gradient of 43 mm Hg and a peak gradient of 70 mm Hg. The AVA was 0.82 cm2. LVEF was 65-70%. He was seen by Dr. Excell Seltzerooper and Dr. Dorris FetchHendrickson and was felt to be a TAVR candidate. Cardiac cath showed severe double vessel CAD with high grade proximal LAD stenosis and total occlusion of the RCA. The LCX was patent. The LIMA graft was patent as was the SVG's to the diagonal and distal RCA. The SVG to the OM was occluded. His peak to peak gradient was 40, mean gradient 32, and AVA 1.0 cm2.    During the course of the  patient's preoperative work up they have been evaluated comprehensively by a multidisciplinary team of specialists coordinated through the Multidisciplinary Heart Valve Clinic in the [facility].  They have been demonstrated to suffer from symptomatic severe aortic stenosis as noted above. The patient has been counseled extensively as to the relative risks and benefits of all options for the treatment of severe aortic stenosis including long term medical therapy, conventional surgery for aortic valve replacement, and transcatheter aortic valve replacement.  The patient has been independently evaluated by two cardiac surgeons including Dr Dorris FetchHendrickson and Dr. Laneta SimmersBartle, and they are felt to be at high risk for conventional surgical aortic valve replacement based upon a predicted risk of mortality using the Society of Thoracic Surgeons risk calculator of 14.7%. Both surgeons indicated the patient would be a poor candidate for conventional surgery (predicted risk of mortality >15% and/or predicted risk of permanent morbidity >50%) because of comorbidities including previous CABG, advanced age, severe lung disease, and chronic kidney disease.   Based upon review of all of the patient's preoperative diagnostic tests they are felt to be candidate for transcatheter aortic valve replacement using the transfemoral approach as an alternative to high risk conventional surgery.    Following the decision to proceed with transcatheter aortic valve replacement, a discussion has been held regarding what types of management strategies would be attempted intraoperatively in the event of life-threatening complications, including whether or not the patient  would be considered a candidate for the use of cardiopulmonary bypass and/or conversion to open sternotomy for attempted surgical intervention.  The patient has been advised of a variety of complications that might develop peculiar to this approach including  but not limited to risks of death, stroke, paravalvular leak, aortic dissection or other major vascular complications, aortic annulus rupture, device embolization, cardiac rupture or perforation, acute myocardial infarction, arrhythmia, heart block or bradycardia requiring permanent pacemaker placement, congestive heart failure, respiratory failure, renal failure, pneumonia, infection, other late complications related to structural valve deterioration or migration, or other complications that might ultimately cause a temporary or permanent loss of functional independence or other long term morbidity.  The patient provides full informed consent for the procedure as described and all questions were answered preoperatively.    DETAILS OF THE OPERATIVE PROCEDURE  PREPARATION:    The patient is brought to the operating room on the above mentioned date and central monitoring was established by the anesthesia team including placement of Swan-Ganz catheter and radial arterial line. The patient is placed in the supine position on the operating table.  Intravenous antibiotics are administered. General endotracheal anesthesia is induced uneventfully. A Foley catheter is placed.  Baseline transesophageal echocardiogram was performed. The patient's chest, abdomen, both groins, and both lower extremities are prepared and draped in a sterile manner. A time out procedure is performed.   PERIPHERAL ACCESS:    Using the modified Seldinger technique, femoral arterial and venous access was obtained with placement of 6 Fr sheaths on the right side.  A pigtail diagnostic catheter was passed through the right femoral arterial sheath under fluoroscopic guidance into the aortic root.  A temporary transvenous pacemaker catheter was passed through the right femoral venous sheath under fluoroscopic guidance into the right ventricle.  The pacemaker was tested to ensure stable lead placement and pacemaker capture. Aortic root  angiography was performed in order to determine the optimal angiographic angle for valve deployment.   TRANSFEMORAL ACCESS:   A left femoral arterial cutdown was performed by Dr Laneta SimmersBartle. Please see his separate operative note for details. The patient was heparinized systemically and ACT verified > 250 seconds.    A 20 Fr transfemoral sheath was introduced into the left femoral artery after progressively dilating over an Amplatz superstiff wire. An AL-2 catheter was used to direct a straight-tip exchange length wire across the native aortic valve into the left ventricle. This was exchanged out for a pigtail catheter and position was confirmed in the LV apex. Simultaneous LV and Ao pressures were recorded.  The pigtail catheter was then exchanged for an Amplatz Extra-stiff wire in the LV apex. At that point, BAV was performed using a 24 mm valvuloplasty balloon (Bard True Balloon).  This balloon was used to 'size' the annulus and confirm THV size. An inflation was done with rapid ventricular pacing and aortic root angiography. While the balloon moved out of the valve during inflation, the appearance of the balloon in the aortic root suggested that a 26 mm THV would be the appropriate size.  The patient recovered well hemodynamically.   TRANSCATHETER HEART VALVE DEPLOYMENT:  An Edwards Sapien XT THV (size 26 mm) was prepared and crimped per manufacturer's guidelines, and the proper orientation of the valve is confirmed on the [employer] Novoflex Plus delivery system. The valve was advanced through the introducer sheath using normal technique until in an appropriate position in the abdominal aorta beyond the sheath tip. The balloon was then retracted and using  the fine-tuning wheel was centered on the valve. The valve was then advanced across the aortic arch using appropriate flexion of the catheter. The valve was carefully positioned across the aortic valve annulus. The Novaflex catheter was retracted using  normal technique. Once final position of the valve has been confirmed by angiographic assessment, the valve is deployed while temporarily holding ventilation and during rapid ventricular pacing to maintain systolic blood pressure < 50 mmHg and pulse pressure < 10 mmHg. The balloon inflation is held for >3 seconds after reaching full deployment volume. Once the balloon has fully deflated the balloon is retracted into the ascending aorta and valve function is assessed using TEE. There is felt to be at least 2+ paravalvular leak and no central aortic insufficiency.  Because of significant AI, an addition 1 cc of fluid was added to the balloon and a second inflation was performed (post-dilatation). This improved the peri-valvular AI which appeared trace-1+ after post-dilatation. There was no central AI. The patient's hemodynamic recovery following valve deployment is good.  The deployment balloon and guidewire are both removed. Echo demostrated acceptable post-procedural gradients, stable mitral valve function, and 1+ AI. Aortography was deferred because of chronic kidney disease.   PROCEDURE COMPLETION:  The sheath was then removed and arteriotomy repaired by Dr Laneta Simmers. Please see his separate report for details. Protamine was administered once femoral arterial repair was complete. The temporary pacemaker, pigtail catheters and femoral sheaths were removed with manual pressure used for hemostasis.   The patient tolerated the procedure well and is transported to the surgical intensive care in stable condition. There were no immediate intraoperative complications. All sponge instrument and needle counts are verified correct at completion of the operation.   No blood products were administered during the operation.  The patient received a total of 56 mL of intravenous contrast during the procedure.  [name] 11/17/2013 4:17 PM

## 2013-11-17 NOTE — Transfer of Care (Signed)
Immediate Anesthesia Transfer of Care Note  Patient: Craig GitelmanJames H Tessier  Procedure(s) Performed: Procedure(s): TRANSCATHETER AORTIC VALVE REPLACEMENT, TRANSFEMORAL (N/A) INTRAOPERATIVE TRANSESOPHAGEAL ECHOCARDIOGRAM (N/A)  Patient Location: ICU  Anesthesia Type:General  Level of Consciousness: awake, alert  and oriented  Airway & Oxygen Therapy: Patient Spontanous Breathing and Patient connected to face mask oxygen  Post-op Assessment: Report given to PACU RN  Post vital signs: Reviewed and stable  Complications: No apparent anesthesia complications

## 2013-11-17 NOTE — Anesthesia Preprocedure Evaluation (Addendum)
Anesthesia Evaluation  Patient identified by MRN, date of birth, ID band Patient awake    Reviewed: Allergy & Precautions, H&P , NPO status , Patient's Chart, lab work & pertinent test results, reviewed documented beta blocker date and time   Airway Mallampati: II TM Distance: >3 FB Neck ROM: Full    Dental  (+) Teeth Intact, Dental Advisory Given   Pulmonary  breath sounds clear to auscultation        Cardiovascular hypertension, Pt. on medications and Pt. on home beta blockers + angina + CAD and + Past MI + Valvular Problems/Murmurs AS Rhythm:Regular Rate:Normal + Systolic murmurs    Neuro/Psych    GI/Hepatic   Endo/Other    Renal/GU Renal InsufficiencyRenal disease     Musculoskeletal   Abdominal   Peds  Hematology   Anesthesia Other Findings   Reproductive/Obstetrics                          Anesthesia Physical Anesthesia Plan  ASA: III  Anesthesia Plan: General   Post-op Pain Management:    Induction: Intravenous  Airway Management Planned: Oral ETT  Additional Equipment:   Intra-op Plan:   Post-operative Plan: Possible Post-op intubation/ventilation  Informed Consent: I have reviewed the patients History and Physical, chart, labs and discussed the procedure including the risks, benefits and alternatives for the proposed anesthesia with the patient or authorized representative who has indicated his/her understanding and acceptance.   Dental advisory given  Plan Discussed with: CRNA and Anesthesiologist  Anesthesia Plan Comments: (Severe symptomatic aortic stenosis Renal insufficiency Cr 2.10 CAD S/P CABG 2005 LVEF 60-65% Htn  Plan GA)        Anesthesia Quick Evaluation

## 2013-11-17 NOTE — Op Note (Signed)
CARDIOTHORACIC SURGERY OPERATIVE NOTE  Date of Procedure:  11/17/2013  Preoperative Diagnosis: Severe Aortic Stenosis   Postoperative Diagnosis: Same   Procedure:    Transcatheter Aortic Valve Replacement - 26 mm Transfemoral Approach  Edwards Sapien XT (size 26 mm, model # 9300TFX, serial # [id number])   Co-Surgeons:  Alleen BorneBryan K. Bartle, MD and Tonny BollmanMichael Cooper, MD  Assistants:   Salvatore Decentlarence H. Cornelius Moraswen, MD and Verne Carrowhristopher McAlhany, MD  Anesthesiologist:  Kipp Broodavid Joslin, MD  Echocardiographer:  Charlton HawsPeter Nishan, MD  Pre-operative Echo Findings:   severe aortic stenosis  normal left ventricular systolic function  Post-operative Echo Findings:  mild 1+ paravalvular leak  normal left ventricular systolic function     DETAILS OF THE OPERATIVE PROCEDURE  The majority of the procedure is documented separately in a procedure note by Dr. Excell Seltzerooper.   TRANSFEMORAL ACCESS:   A small incision is made in the left groin immediately over the common femoral artery. The subcutaneous tissues are divided with electrocautery and the anterior surface of the common femoral artery is identified. Sharp dissection is utilized to free up the artery proximally and distally and the vessel is encircled with a vessel loop.  A pair of CV-4 Gore-tex sutures are place as diamond-shaped purse-strings on the anterior surface of the femoral artery.  The patient is heparinized systemically and ACT verified > 250 seconds.  The common femoral artery is punctured using an  18 gauge needle and a soft J-tipped guidewire is passed into the common iliac artery under fluoroscopic guidance.  A 6 Fr straight diagnostic catheter is placed over the guidewire and the guidewire is removed.  An Amplatz super stiff guidewire is passed through the sheath into the descending thoracic aorta and the introducing diagnostic catheter is removed.  Serial dilators are passed over the guidewire under continuous fluoroscopic guidance, making certain that  each dilator passes easily all of the way into the distal abdominal aorta.  A  20 Fr Edwards Novoflex Plus introducer sheath is passed over the guidewire into the abdominal aorta.  The introducing dilator is removed, the sheath is flushed with heparinized saline, and the sheath is secured to the skin.    FEMORAL SHEATH REMOVAL AND ARTERIAL CLOSURE:  After the completion of successful valve deployment as documented separately by Dr. Excell Seltzerooper, the femoral artery sheath is removed and the arteriotomy is closed using the previously placed Gore-tex purse-string sutures. Once the repair has been completed protamine was administered to reverse the anticoagulation. A digitally-subtracted arteriogram was not performed since he had a large, normal appearing common femoral artery with no visible evidence of narrowing externally and an elevated creatinine of 2.1.  The incision is irrigated with saline solution and subsequently closed in multiple layers using absorbable suture.  The skin incision is closed using a subcuticular skin closure.     Alleen BorneBryan K.  Bartle MD 11/17/2013 2:55 PM

## 2013-11-17 NOTE — Progress Notes (Signed)
[facility] BRIEF SICU PROGRESS NOTE  Day of Surgery  S/P Procedure(s) (LRB): TRANSCATHETER AORTIC VALVE REPLACEMENT, TRANSFEMORAL (N/A) INTRAOPERATIVE TRANSESOPHAGEAL ECHOCARDIOGRAM (N/A)   Looks good.  Only complaint is soreness in throat and neck NSR w/ stable hemodynamics O2 sats 99% on 6 L/min via [facility] UOP adequate Labs okay  Plan: Continue routine early postop  [name] 11/17/2013 4:55 PM

## 2013-11-17 NOTE — H&P (Signed)
[address]       [name] [id number]             [phone]      Cardiothoracic Surgery History and Physical    Referring Provider is Craig Bollmanooper, Michael, MD  PCP is [name],Craig A, MD   Chief Complaint   Patient presents with   .  Aortic Stenosis     EVAL FOR TAVR SURGERY    HPI:  The patient is an 80 year old gentleman who underwent CABG x 4 by Dr. Dorris Hayes in 2004 after presenting with Hayes NSTEMI. He had Hayes LIMA to the LAD, SVG to diagonal, SVG to OM1, and Hayes SVG to the distal RCA. He has done well until the past year when he began having chest discomfort, shortness of breath and fatigue with exertion. He has [place] and has been very active working there in the past but has had to slow down over the past year and his symptoms of chest discomfort and SOB seem to be progressing. He has Hayes history of aortic stenosis and had Hayes repeat 2D-echo on 09/10/2013 showing significant progression with Hayes mean transvalvular gradient of 43 mm Hg and Hayes peak gradient of 70 mm Hg. The AVA was 0.82 cm2. LVEF was 65-70%. He was seen by [name] and Dr. Dorris Hayes and was felt to be Hayes TAVR candidate. Cardiac cath showed severe double vessel CAD with high grade proximal LAD stenosis and total occlusion of the RCA. The LCX was patent. The LIMA graft was patent as was the SVG's to the diagonal and distal RCA. The SVG to the OM was occluded. His peak to peak gradient was 40, mean gradient 32, and AVA 1.0 cm2.  Past Medical History   Diagnosis  Date   .  Coronary atherosclerosis of native coronary artery      Multivessel, LVEF 65%   .  Mixed hyperlipidemia    .  Essential hypertension, benign    .  Myocardial infarction    .  Renal insufficiency    .  Arthritis    .  Aortic stenosis     Past Surgical History   Procedure  Laterality  Date   .  Umbilical hernia repair     .  Appendectomy     .  Left total knee arthroplasty     .  Coronary artery bypass graft       LIMA to LAD, SVG  to OM, SVG to RCA, SVG to diagonal 11/04   .  Cataract extraction  Left  04/27/13     Dr. Lita Hayes   History reviewed. No pertinent family history.  History    Social History   .  Marital Status:  Married     Spouse Name:  N/Hayes     Number of Children:  N/Hayes   .  Years of Education:  N/[place]    Occupational History   .  Not on file.    Social History Main Topics   .  Smoking status:  Never Smoker   .  Smokeless tobacco:  Never Used      Comment: tobacco use - no   .  Alcohol Use:  No   .  Drug Use:  No   .  Sexual Activity:  Not on file    Other Topics  Concern   .  Not on file    Social History Narrative    Married.  Current Outpatient Prescriptions   Medication  Sig  Dispense  Refill   .  acetaminophen (TYLENOL) 325 MG tablet  Take 650 mg by mouth every 6 (six) hours as needed for mild pain.     Marland Kitchen  aspirin 81 MG tablet  Take 81 mg by mouth daily.     .  famotidine (PEPCID) 20 MG tablet  Take 20 mg by mouth 2 (two) times daily as needed for heartburn.     .  hydrALAZINE (APRESOLINE) 10 MG tablet  Take 1 tablet (10 mg total) by mouth 3 (three) times daily.  270 tablet  3   .  isosorbide mononitrate (IMDUR) 30 MG 24 hr tablet  Take one-half by mouth daily  45 tablet  3   .  metoprolol (LOPRESSOR) 50 MG tablet  Take 1 tablet (50 mg total) by mouth 2 (two) times daily.  180 tablet  3   .  nitroGLYCERIN (NITROSTAT) 0.4 MG SL tablet  Place 1 tablet (0.4 mg total) under the tongue every 5 (five) minutes as needed.  25 tablet  3   .  Omega-3 Fatty Acids (FISH OIL) 1200 MG CAPS  Take 1 capsule by mouth 3 (three) times daily.     .  simvastatin (ZOCOR) 40 MG tablet  Take 1 tablet (40 mg total) by mouth every evening.  30 tablet  6    No current facility-administered medications for this visit.    Allergies   Allergen  Reactions   .  Lipitor [Atorvastatin Calcium]      Leg pain    Review of Systems:  General: normal appetite, decreased energy, no weight gain, no weight loss, no fever    Cardiac: positive chest pain with exertion, no chest pain at rest, positive SOB with mild exertion, no resting SOB, no PND, no orthopnea, no palpitations, no arrhythmia, no atrial fibrillation, no LE edema, no dizzy spells, no syncope  Respiratory: positive shortness of breath, no home oxygen, no productive cough, no dry cough, no bronchitis, no wheezing, no hemoptysis, no asthma, no pain with inspiration or cough, no sleep apnea, no CPAP at night  GI: no difficulty swallowing, no reflux, no frequent heartburn, no hiatal hernia, no abdominal pain, no constipation, no diarrhea, no hematochezia, no hematemesis, no melena  GU: no dysuria, no frequency, no urinary tract infection, no hematuria, no enlarged prostate, no kidney stones, no kidney disease  Vascular: no pain suggestive of claudication, no pain in feet, no leg cramps, no varicose veins, no DVT, no non-healing foot ulcer  Neuro: no stroke, no TIA's, no seizures, no headaches, notemporary blindness one eye, no slurred speech, no peripheral neuropathy, no chronic pain, no instability of gait, no memory/cognitive dysfunction  Musculoskeletal: Positive degenerative arthritis, no joint swelling, no myalgias, no difficulty walking, normal mobility  Skin: no rash, no itching, no skin infections, no pressure sores or ulcerations  Psych: no anxiety, no depression, no nervousness, no unusual recent stress  Eyes: no blurry vision, no floaters, no recent vision changes, no wears glasses or contacts  ENT: has hearing loss and wears bilateral hearing aids, no loose or painful teeth,  Hematologic: no easy bruising, no abnormal bleeding, no clotting disorder, no frequent epistaxis  Endocrine: no diabetes   Physical Exam:  BP 154/50  Pulse 50  General: well-appearing  HEENT: Unremarkable , NCAT, PERLA, EOMI, oropharynx clear  Neck: no JVD, no bruits, no adenopathy or thyromegaly  Chest: clear to auscultation, symmetrical breath sounds, no wheezes, no  rhonchi  CV: RRR, grade III/VI crescendo/decrescendo murmur heard best at RSB, no diastolic murmur  Abdomen: soft, non-tender, no masses or organomegaly  Extremities: warm, well-perfused, pulses normal, no LE edema  Rectal/GU Deferred  Neuro: Grossly non-focal and symmetrical throughout  Skin: Clean and dry, no rashes, no breakdown    Diagnostic Tests:  *[facility] [address] [place], [place] [address] [phone]  ------------------------------------------------------------ Transthoracic Echocardiography  Patient: [name], [name] MR #: [id number] Study Date: 09/10/2013 Gender: M Age: 7 Height: 177.8cm Weight: 83.9kg BSA: 2.52m^2 Pt. Status: Room:  ATTENDING Leodis Binet, Brand Males SONOGRAPHER Greggory Stallion, RDCS PERFORMING [facility] cc:  ------------------------------------------------------------ LV EF: 65% - 70%  ------------------------------------------------------------ History: PMH: Moderate to severe AoV stenosis 09-14. Dyspnea. Coronary artery disease. Aortic valve disease. Risk factors: Hypertension. Dyslipidemia.  ------------------------------------------------------------ Study Conclusions  - Left ventricle: The cavity size was normal. Wall thickness was increased in Hayes pattern of mild LVH. Systolic function was vigorous. The estimated ejection fraction was in the range of 65% to 70%. Wall motion was normal; there were no regional wall motion abnormalities. Doppler parameters are consistent with abnormal left ventricular relaxation (grade 1 diastolic dysfunction). - Aortic valve: Moderately calcified annulus. Trileaflet; moderately thickened, moderately calcified leaflets. Cusp separation was severely reduced. There was severe stenosis. Mild regurgitation. Mean gradient: 43mm Hg (S). Peak gradient: 70mm Hg (S). VTI ratio of LVOT to aortic valve: 0.29. Valve area: 0.92cm^2(VTI). Valve  area: 0.82cm^2 (Vmax). - Mitral valve: Calcified annulus. Mildly thickened leaflets . Mild regurgitation. - Left atrium: The atrium was severely dilated. - Tricuspid valve: Trivial regurgitation. Peak RV-RA gradient: 27mm Hg (S). - Inferior vena cava: Not visualized. Unable to estimate CVP. - Pericardium, extracardiac: There was no pericardial effusion. Impressions:  - Mild LVH with LVEF 65-70%, grade 1 diastolic dysfunction. Severe left atrial enlargement. MAC with mild mitral regurgitation. Severe calcific aortic stenosis with mild aortic regurgitation as outlined above - gradients have increased compared to the prior study. Trivial tricuspid regurgitation with RV-RA gradient 27 mmHg.  ------------------------------------------------------------ Labs, prior tests, procedures, and surgery: Coronary artery bypass grafting.  Transthoracic echocardiography. M-mode, complete 2D, spectral Doppler, and color Doppler. Height: Height: 177.8cm. Height: 70in. Weight: Weight: 83.9kg. Weight: 184.6lb. Body mass index: BMI: 26.5kg/m^2. Body surface area: BSA: 2.81m^2. Blood pressure: 150/70. Patient status: Outpatient.  ------------------------------------------------------------  ------------------------------------------------------------ Left ventricle: The cavity size was normal. Wall thickness was increased in Hayes pattern of mild LVH. Systolic function was vigorous. The estimated ejection fraction was in the range of 65% to 70%. Wall motion was normal; there were no regional wall motion abnormalities. Doppler parameters are consistent with abnormal left ventricular relaxation (grade 1 diastolic dysfunction).  ------------------------------------------------------------ Aortic valve: Moderately calcified annulus. Trileaflet; moderately thickened, moderately calcified leaflets. Cusp separation was severely reduced. Doppler: There was severe stenosis. Mild regurgitation. VTI ratio  of LVOT to aortic valve: 0.29. Valve area: 0.92cm^2(VTI). Peak velocity ratio of LVOT to aortic valve: 0.26. Valve area: 0.82cm^2 (Vmax). Mean gradient: 43mm Hg (S). Peak gradient: 70mm Hg (S).  ------------------------------------------------------------ Aorta: Aortic root: The aortic root was normal in size.  ------------------------------------------------------------ Mitral valve: Calcified annulus. Mildly thickened leaflets . Doppler: Mild regurgitation. Peak gradient: 3mm Hg (D).  ------------------------------------------------------------ Left atrium: The atrium was severely dilated.  ------------------------------------------------------------ Right ventricle: The cavity size was normal. Systolic function was normal.  ------------------------------------------------------------ Pulmonic valve: The valve appears to be grossly normal. Doppler: Trivial regurgitation.  ------------------------------------------------------------ Tricuspid valve: The valve appears to be grossly  normal. Doppler: Trivial regurgitation.  ------------------------------------------------------------ Pulmonary artery: Systolic pressure could not be accurately estimated.  ------------------------------------------------------------ Right atrium: The atrium was normal in size.  ------------------------------------------------------------ Pericardium: There was no pericardial effusion.  ------------------------------------------------------------ Systemic veins: Inferior vena cava: Not visualized. Unable to estimate CVP.  ------------------------------------------------------------  2D measurements Normal Doppler measurements Normal Left ventricle LVOT LVID ED, 45.6 mm 43-52 Peak vel, S 109 cm/s ------ chord, VTI, S 36. cm ------ PLAX 1 LVID ES, 30.5 mm 23-38 Aortic valve chord, Peak vel, S 419 cm/s ------ PLAX Mean vel, S 309 cm/s ------ FS, chord, 33 % >29 VTI, S 123 cm ------ PLAX  Mean 43 mm ------ LVPW, ED 11.3 mm ------ gradient, S Hg IVS/LVPW 1.31 <1.3 Peak 70 mm ------ ratio, ED gradient, S Hg Ventricular septum VTI ratio 0.2 ------ IVS, ED 14.8 mm ------ LVOT/AV 9 LVOT Area, VTI 0.9 cm^2 ------ Diam, S 20 mm ------ 2 Area 3.14 cm^2 ------ Peak vel 0.2 ------ Aorta ratio, 6 Root diam, 31 mm ------ LVOT/AV ED Area, Vmax 0.8 cm^2 ------ Left atrium 2 AP dim 46 mm ------ Mitral valve AP dim 2.28 cm/m^2 <2.2 Peak E vel 90. cm/s ------ index 2 Vol, S 96 ml ------ Peak Hayes vel 99. cm/s ------ Vol index, 47.5 ml/m^2 ------ 9 S Deceleration 183 ms 150-23 Right ventricle time 0 RVID ED, 39.7 mm 19-38 Peak 3 mm ------ PLAX gradient, D Hg Peak E/Hayes 0.9 ------ ratio Tricuspid valve Regurg peak 262 cm/s ------ vel Peak RV-RA 27 mm ------ gradient, S Hg Pulmonic valve Regurg vel, 71. cm/s ------ ED 9  ------------------------------------------------------------ Prepared and Electronically Authenticated by  Nona Dell 2015-03-12T13:19:24.183    Cardiac Catheterization Procedure Note  Name: [name]  MRN: [id number]  DOB: [birth year]  Procedure: Right Heart Cath, Left Heart Cath, Selective Coronary Angiography, LV angiography  Indication: Severe aortic stenosis, progressive symptoms of dyspnea and exertional angina (functional class III)  Procedural Details: The right groin was prepped, draped, and anesthetized with 1% lidocaine. Using the modified Seldinger technique Hayes 5 French sheath was placed in the right femoral artery and Hayes 7 French sheath was placed in the right femoral vein. Hayes Swan-Ganz catheter was used for the right heart catheterization. Standard protocol was followed for recording of right heart pressures and sampling of oxygen saturations. Fick cardiac output was calculated. Standard Judkins catheters were used for selective coronary angiography. Ventriculography was deferred because of chronic kidney disease. Hayes straight tip wire was used  to cross the aortic valve. Hayes JR 4 catheter was used for saphenous vein graft angiography. LIMA catheter was used for LIMA angiography. There were no immediate procedural complications. The patient was transferred to the post catheterization recovery area for further monitoring.  Procedural Findings:  Hemodynamics  RA mean of 3  RV 56/6  PA 53/11 with Hayes mean of 26  PCWP Hayes wave 13, V wave 9, mean of 9  LV 233/30  AO 193/70 with Hayes mean of 118  Oxygen saturations:  PA 64  AO 90  Cardiac Output (Fick) 5.9  Cardiac Index (Fick) 2.9  Aortic valve hemodynamics:  Peak to peak gradient 40 mm mercury, mean gradient 32 mm mercury, aortic valve area 1.0 cm     Coronary angiography:  Coronary dominance: right  Left mainstem: Patent without obstructive disease. Arises from the left cusp it divides into the LAD and left circumflex.  Left anterior descending (LAD): The LAD is patent with 90% proximal stenosis just before the first  diagonal branch. The distal vessel and first diagonal both fill from competitive graft flow.  Left circumflex (LCx): The left circumflex is patent. The vessel supplies Hayes first obtuse marginal without significant disease. The first OM is tented up at the site of previous vein graft anastomosis. There is Hayes small intermediate branch without significant disease.  Right coronary artery (RCA): The native RCA was not selectively injected. The vessel fills from the saphenous vein graft. The vessel fills retrograde and is occluded.  Saphenous vein graft to distal RCA. The vein graft is patent. There is an irregular plaque with no more than 30-40% stenosis in the proximal graft. The PDA and PLA branches are patent.  Saphenous vein graft to OM: 100% occlusion at the proximal anastomosis  Saphenous vein graft to first diagonal: Widely patent without significant stenosis. There is extensive filling of the left coronary artery via antegrade and retrograde graft flow.  LIMA to LAD: Widely  patent throughout its course. The coronary anastomotic site is widely patent. The LAD has no significant distal vessel disease beyond the LIMA insertion site.  Aortic valve under fluoroscopy is severely calcified with restricted mobility.  Left ventriculography: Deferred because of chronic kidney disease  Final Conclusions:  1. Severe double vessel coronary artery disease with severe stenosis of the proximal LAD and total occlusion of the native RCA  2. Patency of the left circumflex without significant stenosis.  3. Status post aortocoronary bypass surgery with continued patency of the LIMA to LAD, saphenous vein graft to diagonal, and saphenous vein graft to distal RCA  4. Total occlusion of the saphenous vein graft to obtuse marginal  5. Severe aortic stenosis  Recommendations: Will refer to cardiac surgery for consideration of treatment options for severe symptomatic aortic stenosis (TAVR versus redo sternotomy/SARV).  [name]  10/13/2013, 1:59 PM    Pulmonary Function Testing    Ref Range  1wk ago   FVC-Pre  L  3.59     FVC-%Pred-Pre  %  91     FVC-Post  L  3.48     FVC-%Pred-Post  %  88     FVC-%Change-Post  %  -2     FEV1-Pre  L  2.71     FEV1-%Pred-Pre  %  98     FEV1-Post  L  2.78     FEV1-%Pred-Post  %  100     FEV1-%Change-Post  %  2     FEV6-Pre  L  3.55     FEV6-%Pred-Pre  %  97     FEV6-Post  L  3.48     FEV6-%Pred-Post  %  95     FEV6-%Change-Post  %  -2     Pre FEV1/FVC ratio  %  76     FEV1FVC-%Pred-Pre  %  106     Post FEV1/FVC ratio  %  80     FEV1FVC-%Change-Post  %  5     Pre FEV6/FVC Ratio  %  99     FEV6FVC-%Pred-Pre  %  106     Post FEV6/FVC ratio  %  100     FEV6FVC-%Pred-Post  %  107     FEV6FVC-%Change-Post  %  1     FEF 25-75 Pre  L/sec  2.11     FEF2575-%Pred-Pre  %  113     FEF 25-75 Post  L/sec  2.38     FEF2575-%Pred-Post  %  128     FEF2575-%Change-Post  %  13  RV  L  2.06     RV % pred  %  76     TLC  L  5.45     TLC % pred   %  77     DLCO unc  ml/min/mmHg  13.58     DLCO unc % pred  %  41     DLCO cor  ml/min/mmHg  13.58     DLCO cor % pred  %  41     DL/VA  ml/min/mmHg/L  9.60     DL/VA % pred  %  58     Resulting Agency  [facility]     Specimen Collected: 10/22/13 9:26 AM  Last Resulted: 11/02/13 12:53 PM   EXAM:  OVER-READ INTERPRETATION CT CHEST  The following report is an over-read performed by radiologist Dr.  Noe Gens [facility] on 11/02/2013. This over-read  does not include interpretation of cardiac or coronary anatomy or  pathology. The coronary CTA interpretation by the cardiologist is  attached.  COMPARISON: None.  FINDINGS:  Prior median sternotomy and CABG. No mediastinal, hilar, or axillary  adenopathy.  No filling defects in the central pulmonary arteries to suggest  large or segmental pulmonary emboli. No pleural effusions. Linear  densities in the lingula and both lower lobes at the lung bases most  compatible with scarring. No confluent airspace opacities or  suspicious pulmonary nodules.  No acute bony abnormality or focal bone lesion. Imaging into the  upper abdomen shows no acute findings.  Aorta is normal caliber.  IMPRESSION:  No acute or significant extracardiac abnormality.  Prior CABG.  Areas of scarring in the lingula and lung bases.  Electronically Signed  By: Charlett Nose M.D.  On: 11/02/2013 17:24  CLINICAL DATA: 80 year old male with history of severe aortic  stenosis. Preprocedural examination prior to potential transcatheter  aortic valve replacement.  EXAM:  CT ANGIOGRAPHY CHEST, ABDOMEN AND PELVIS  TECHNIQUE:  Multidetector CT imaging through the chest, abdomen and pelvis was  performed using the standard protocol during bolus administration of  intravenous contrast. Multiplanar reconstructed images and MIPs were  obtained and reviewed to evaluate the vascular anatomy.  CONTRAST: 80mL OMNIPAQUE IOHEXOL 350 MG/ML SOLN  COMPARISON: CT of the  abdomen and pelvis 05/13/2003.  FINDINGS:  CT CHEST FINDINGS  Mediastinum: Aortic valve appears thickened and heavily calcified.  Heart size is mildly enlarged. There is no significant pericardial  fluid, thickening or pericardial calcification. There is  atherosclerosis of the thoracic aorta, the great vessels of the  mediastinum and the coronary arteries, including calcified  atherosclerotic plaque in the left main, left anterior descending  and right coronary arteries. Status post median sternotomy for CABG,  including LIMA to the LAD. No pathologically enlarged mediastinal or  hilar lymph nodes. Esophagus is unremarkable in appearance.  Lungs/Pleura: 4 mm nodule in the lateral aspect of the right upper  lobe (image 32 of series 5). No other larger more suspicious  appearing pulmonary nodules or masses. Trace left pleural effusion  or pleural thickening. No acute consolidative airspace disease.  Musculoskeletal: Median sternotomy wires. There are no aggressive  appearing lytic or blastic lesions noted in the visualized portions  of the skeleton.  CT ABDOMEN AND PELVIS FINDINGS  Abdomen/Pelvis: The appearance of the liver, gallbladder, pancreas,  spleen and bilateral adrenal glands is unremarkable. Mild bilateral  renal atrophy with multifocal areas of cortical thinning in the  kidneys bilaterally, presumably scarring related to prior  infection/inflammation. Multiple low-attenuation  lesions in the  kidneys bilaterally, many of which are subcentimeter and too small  to definitively characterize. In addition, there are bilateral  simple cysts, largest of which is exophytic in the lower pole of the  right kidney measuring up to 7.1 cm in diameter. No significant  volume of ascites. No pneumoperitoneum. No pathologic distention of  small bowel. No definite lymphadenopathy identified within the  abdomen or pelvis. Several colonic diverticulae are noted, most  pronounced in the region of  the sigmoid colon, without surrounding  inflammatory changes to suggest an acute diverticulitis at this  time. Prostate gland and urinary bladder are unremarkable in  appearance.  Musculoskeletal: Degenerative disc disease and lumbar spondylosis.  There are no aggressive appearing lytic or blastic lesions noted in  the visualized portions of the skeleton.  VASCULAR MEASUREMENTS PERTINENT TO TAVR:  AORTA:  Minimal Aortic Diameter - 13.5 x 13.0 mm  Severity of Aortic Calcification - moderate to severe  RIGHT PELVIS:  Right Common Iliac Artery -  Minimal Diameter - 8.0 x 8.7 mm  Tortuosity - Severe  Calcification - Mild to moderate  Right External Iliac Artery -  Minimal Diameter - 8.5 x 8.2 mm  Tortuosity - Mild  Calcification - Minimal  Right Common Femoral Artery -  Minimal Diameter - 6.7 x 6.6 mm  Tortuosity - Mild  Calcification - Minimal  LEFT PELVIS:  Left Common Iliac Artery -  Minimal Diameter - 9.8 x 8.4 mm  Tortuosity - Moderate  Calcification - Mild  Left External Iliac Artery -  Minimal Diameter - 8.2 x 8.4 mm  Tortuosity - Moderate to severe.  Calcification - Minimal  Left Common Femoral Artery -  Minimal Diameter - 8.2 x 8.6 mm  Tortuosity - Minimal  Calcification - None  Review of the MIP images confirms the above findings.  IMPRESSION:  1. Findings an measurements pertinent to potential TAVR procedure,  as detailed above. The patient does appear to have suitable pelvic  arterial access, most favorable on the left side related to reduced  tortuosity of the left common iliac artery relative to the right.  2. 4 mm nodule in the lateral aspect of the right upper lobe. If the  patient is at high risk for bronchogenic carcinoma, follow-up chest  CT at 1 year is recommended. If the patient is at low risk, no  follow-up is needed. This recommendation follows the consensus  statement: Guidelines for Management of Small Pulmonary Nodules  Detected on CT Scans: Hayes  Statement from the Fleischner Society as  published in Radiology 2005; 237:395-400.  3. Mild colonic diverticulosis without findings to suggest acute  diverticulitis at this time.  4. Additional incidental findings, as above.  Electronically Signed  By: Trudie Reedaniel Entrikin M.D.  On: 10/23/2013 10:55    STS Risk Profile  Risk of Mortality: 14.7%  Risk of Morbidity or Mortality: 47.9%  Risk of Stroke: 3.1%  Risk of prolonged ventilation: 45%    Impression:  He is an 80 year old gentleman with severe symptomatic aortic stenosis and NYHA Class III symptoms of angina, shortness of breath, and fatigue. He has remained active until recently. He has had prior CABG but has patent grafts with the exception of the SVG to the OM. The LCX has no significant stenosis which is likely why this graft closed. I discussed the natural history of severe aortic stenosis with the patient and his wife and daughter, including the rapidly progressive nature at this point and poor  prognosis without valve replacement. I discussed the treatment options available including traditional open surgery and TAVR. His operative risk is quite high as estimated by the STS risk profile due to his advanced age, redo surgery, severe lung disease, and chronic renal insufficiency with Hayes creatinine of 1.8. I think TAVR is Hayes reasonable alternative for this patient and will have Hayes lower mortality risk and much quicker recovery. I also discussed the option of palliative care as his condition worsens but I would not recommend this since he is in overall fairly good shape for his age.  During the course of the patient's preoperative work up he has been evaluated comprehensively by Hayes multidisciplinary team of specialists coordinated through the Multidisciplinary [facility] Valve Clinic in the [facility]. He has been demonstrated to suffer from symptomatic severe aortic stenosis as noted above. The patient has been counseled  extensively as to the relative risks and benefits of all options for the treatment of severe aortic stenosis including long term medical therapy, conventional surgery for aortic valve replacement, and transcatheter aortic valve replacement. The patient has been independently evaluated by two cardiac surgeons including myself and Dr. Charlett Lango, and Dr. Tonny Hayes of interventional cardiology. He is felt to be at high risk for conventional surgical aortic valve replacement based upon Hayes predicted risk of mortality using the Society of Thoracic Surgeons risk calculator of 14.7% and both of [place] feel that the patient would be Hayes poor candidate for conventional surgery because of comorbidities including advanced age, redo surgery, and chronic renal failure.  Based upon review of all of the patient's preoperative diagnostic tests they are felt to be candidate for transcatheter aortic valve replacement using the transfemoral approach as an alternative to high risk conventional surgery.  Following the decision to proceed with transcatheter aortic valve replacement, Hayes discussion has been held regarding what types of management strategies would be attempted intraoperatively in the event of life-threatening complications, including whether or not the patient would be considered Hayes candidate for the use of cardiopulmonary bypass and/or conversion to open sternotomy for attempted surgical intervention. The patient has been advised of Hayes variety of complications that might develop including but not limited to risks of death, stroke, paravalvular leak, aortic dissection or other major vascular complications, aortic annulus rupture, device embolization, cardiac rupture or perforation, mitral regurgitation, acute myocardial infarction, arrhythmia, heart block or bradycardia requiring permanent pacemaker placement, congestive heart failure, respiratory failure, renal failure, pneumonia, infection, other late complications  related to structural valve deterioration or migration, or other complications that might ultimately cause Hayes temporary or permanent loss of functional independence or other long term morbidity. The patient provides full informed consent for the procedure as described and all questions were answered.   Plan:  Left transfemoral TAVR

## 2013-11-17 NOTE — Interval H&P Note (Signed)
History and Physical Interval Note:  11/17/2013 10:18 AM  [name]  has presented today for surgery, with the diagnosis of SEVERE AS  The various methods of treatment have been discussed with the patient and family. After consideration of risks, benefits and other options for treatment, the patient has consented to  Procedure(s): TRANSCATHETER AORTIC VALVE REPLACEMENT, TRANSFEMORAL (N/A) INTRAOPERATIVE TRANSESOPHAGEAL ECHOCARDIOGRAM (N/A) as a surgical intervention .  The patient's history has been reviewed, patient examined, no change in status, stable for surgery.  I have reviewed the patient's chart and labs.  His creatinine is 2.1 today and K+ is 5.2. Questions were answered to the patient's satisfaction.     Alleen BorneBryan K Bartle

## 2013-11-17 NOTE — OR Nursing (Signed)
[facility]

## 2013-11-17 NOTE — Progress Notes (Signed)
  Echocardiogram Echocardiogram Transesophageal (TAVR OR) has been performed.  [name] 11/17/2013, 2:08 PM

## 2013-11-17 NOTE — OR Nursing (Signed)
1st call to SICU 

## 2013-11-17 NOTE — Progress Notes (Signed)
Pt. Set up for central line, O2 2 liters  on nasal cannula, procedure began & resp rate decreased after Midazolam & fentanyl administered  , O2 nasal increased to 6 liters per Dr. Morley KosJoslin's request. Pt. Stimulated & resp rate increased. Pulse- 50's, resp rate 7- 15/min.  Aldrete score maintain at 8-9

## 2013-11-18 ENCOUNTER — Inpatient Hospital Stay (HOSPITAL_COMMUNITY): Payer: Medicare PPO

## 2013-11-18 ENCOUNTER — Encounter (HOSPITAL_COMMUNITY): Payer: Self-pay | Admitting: Certified Registered Nurse Anesthetist

## 2013-11-18 DIAGNOSIS — I059 Rheumatic mitral valve disease, unspecified: Secondary | ICD-10-CM

## 2013-11-18 DIAGNOSIS — I359 Nonrheumatic aortic valve disorder, unspecified: Principal | ICD-10-CM

## 2013-11-18 LAB — CBC
HCT: 32 % — ABNORMAL LOW (ref 39.0–52.0)
Hemoglobin: 11 g/dL — ABNORMAL LOW (ref 13.0–17.0)
MCH: 31.3 pg (ref 26.0–34.0)
MCHC: 34.4 g/dL (ref 30.0–36.0)
MCV: 91.2 fL (ref 78.0–100.0)
PLATELETS: 86 10*3/uL — AB (ref 150–400)
RBC: 3.51 MIL/uL — ABNORMAL LOW (ref 4.22–5.81)
RDW: 13.2 % (ref 11.5–15.5)
WBC: 5.2 10*3/uL (ref 4.0–10.5)

## 2013-11-18 LAB — GLUCOSE, CAPILLARY
GLUCOSE-CAPILLARY: 92 mg/dL (ref 70–99)
Glucose-Capillary: 100 mg/dL — ABNORMAL HIGH (ref 70–99)
Glucose-Capillary: 101 mg/dL — ABNORMAL HIGH (ref 70–99)
Glucose-Capillary: 103 mg/dL — ABNORMAL HIGH (ref 70–99)
Glucose-Capillary: 134 mg/dL — ABNORMAL HIGH (ref 70–99)
Glucose-Capillary: 141 mg/dL — ABNORMAL HIGH (ref 70–99)

## 2013-11-18 LAB — BASIC METABOLIC PANEL
BUN: 30 mg/dL — ABNORMAL HIGH (ref 6–23)
CHLORIDE: 110 meq/L (ref 96–112)
CO2: 20 meq/L (ref 19–32)
CREATININE: 1.72 mg/dL — AB (ref 0.50–1.35)
Calcium: 8.2 mg/dL — ABNORMAL LOW (ref 8.4–10.5)
GFR calc Af Amer: 41 mL/min — ABNORMAL LOW (ref >90)
GFR calc non Af Amer: 35 mL/min — ABNORMAL LOW (ref >90)
GLUCOSE: 102 mg/dL — AB (ref 70–99)
POTASSIUM: 4.8 meq/L (ref 3.7–5.3)
SODIUM: 140 meq/L (ref 137–147)

## 2013-11-18 LAB — MAGNESIUM: Magnesium: 1.9 mg/dL (ref 1.5–2.5)

## 2013-11-18 MED ORDER — AMIODARONE HCL IN DEXTROSE 360-4.14 MG/200ML-% IV SOLN
INTRAVENOUS | Status: AC
  Filled 2013-11-18: qty 200

## 2013-11-18 MED ORDER — MOVING RIGHT ALONG BOOK
Freq: Once | Status: AC
  Administered 2013-11-18: 1
  Filled 2013-11-18: qty 1

## 2013-11-18 MED ORDER — ISOSORBIDE MONONITRATE 15 MG HALF TABLET
15.0000 mg | ORAL_TABLET | Freq: Every day | ORAL | Status: DC
  Administered 2013-11-18 – 2013-11-21 (×4): 15 mg via ORAL
  Filled 2013-11-18 (×4): qty 1

## 2013-11-18 MED ORDER — DOCUSATE SODIUM 100 MG PO CAPS
200.0000 mg | ORAL_CAPSULE | Freq: Every day | ORAL | Status: DC
  Administered 2013-11-18 – 2013-11-21 (×4): 200 mg via ORAL
  Filled 2013-11-18 (×5): qty 2

## 2013-11-18 MED ORDER — PANTOPRAZOLE SODIUM 40 MG PO TBEC
40.0000 mg | DELAYED_RELEASE_TABLET | Freq: Every day | ORAL | Status: DC
  Administered 2013-11-19 – 2013-11-21 (×3): 40 mg via ORAL
  Filled 2013-11-18 (×3): qty 1

## 2013-11-18 MED ORDER — AMIODARONE HCL IN DEXTROSE 360-4.14 MG/200ML-% IV SOLN
30.0000 mg/h | INTRAVENOUS | Status: DC
  Administered 2013-11-19: 30 mg/h via INTRAVENOUS
  Filled 2013-11-18 (×5): qty 200

## 2013-11-18 MED ORDER — ONDANSETRON HCL 4 MG PO TABS: 4.0000 mg | ORAL_TABLET | Freq: Four times a day (QID) | ORAL | Status: DC | PRN

## 2013-11-18 MED ORDER — SODIUM CHLORIDE 0.9 % IV SOLN: 250.0000 mL | INTRAVENOUS | Status: DC | PRN

## 2013-11-18 MED ORDER — HYDRALAZINE HCL 10 MG PO TABS
10.0000 mg | ORAL_TABLET | Freq: Three times a day (TID) | ORAL | Status: DC
  Administered 2013-11-18 – 2013-11-19 (×4): 10 mg via ORAL
  Filled 2013-11-18 (×7): qty 1

## 2013-11-18 MED ORDER — SODIUM CHLORIDE 0.9 % IJ SOLN: 3.0000 mL | INTRAMUSCULAR | Status: DC | PRN

## 2013-11-18 MED ORDER — OXYCODONE HCL 5 MG PO TABS
5.0000 mg | ORAL_TABLET | ORAL | Status: DC | PRN
  Administered 2013-11-19: 10 mg via ORAL
  Administered 2013-11-19 (×2): 5 mg via ORAL
  Filled 2013-11-18 (×2): qty 2

## 2013-11-18 MED ORDER — INSULIN ASPART 100 UNIT/ML ~~LOC~~ SOLN
0.0000 [IU] | SUBCUTANEOUS | Status: DC
  Administered 2013-11-18 (×2): 2 [IU] via SUBCUTANEOUS

## 2013-11-18 MED ORDER — ALUM & MAG HYDROXIDE-SIMETH 200-200-20 MG/5ML PO SUSP: 30.0000 mL | Freq: Four times a day (QID) | ORAL | Status: DC | PRN

## 2013-11-18 MED ORDER — BISACODYL 10 MG RE SUPP: 10.0000 mg | Freq: Every day | RECTAL | Status: DC | PRN

## 2013-11-18 MED ORDER — ONDANSETRON HCL 4 MG/2ML IJ SOLN
4.0000 mg | Freq: Four times a day (QID) | INTRAMUSCULAR | Status: DC | PRN
  Administered 2013-11-19: 4 mg via INTRAVENOUS
  Filled 2013-11-18: qty 2

## 2013-11-18 MED ORDER — AMIODARONE LOAD VIA INFUSION
150.0000 mg | Freq: Once | INTRAVENOUS | Status: AC
  Administered 2013-11-18: 150 mg via INTRAVENOUS
  Filled 2013-11-18: qty 83.34

## 2013-11-18 MED ORDER — AMIODARONE HCL IN DEXTROSE 360-4.14 MG/200ML-% IV SOLN
60.0000 mg/h | INTRAVENOUS | Status: AC
  Administered 2013-11-18 (×2): 60 mg/h via INTRAVENOUS

## 2013-11-18 MED ORDER — BISACODYL 5 MG PO TBEC
10.0000 mg | DELAYED_RELEASE_TABLET | Freq: Every day | ORAL | Status: DC | PRN
  Administered 2013-11-19: 10 mg via ORAL
  Filled 2013-11-18 (×2): qty 2

## 2013-11-18 MED ORDER — SODIUM CHLORIDE 0.9 % IJ SOLN
3.0000 mL | Freq: Two times a day (BID) | INTRAMUSCULAR | Status: DC
  Administered 2013-11-18 – 2013-11-21 (×6): 3 mL via INTRAVENOUS

## 2013-11-18 MED ORDER — ACETAMINOPHEN 325 MG PO TABS
650.0000 mg | ORAL_TABLET | Freq: Four times a day (QID) | ORAL | Status: DC | PRN
  Administered 2013-11-20: 650 mg via ORAL
  Filled 2013-11-18: qty 2

## 2013-11-18 MED ORDER — TRAMADOL HCL 50 MG PO TABS
50.0000 mg | ORAL_TABLET | Freq: Four times a day (QID) | ORAL | Status: DC | PRN
  Administered 2013-11-19: 50 mg via ORAL
  Filled 2013-11-18: qty 1

## 2013-11-18 NOTE — Progress Notes (Signed)
1 Day Post-Op Procedure(s) (LRB): TRANSCATHETER AORTIC VALVE REPLACEMENT, TRANSFEMORAL (N/A) INTRAOPERATIVE TRANSESOPHAGEAL ECHOCARDIOGRAM (N/A) Subjective:  No complaints  Objective: Vital signs in last 24 hours: Temp:  [95.7 F (35.4 C)-96.8 F (36 C)] 96.6 F (35.9 C) (05/20 0800) Pulse Rate:  [44-73] 52 (05/20 0800) Cardiac Rhythm:  [-] Sinus bradycardia (05/20 0800) Resp:  [9-23] 14 (05/20 0800) BP: (117-188)/(34-56) 131/52 mmHg (05/20 0800) SpO2:  [98 %-100 %] 100 % (05/20 0800) Arterial Line BP: (123-247)/(41-75) 188/56 mmHg (05/20 0800) Weight:  [89.812 kg (198 lb)] 89.812 kg (198 lb) (05/20 0500)  Hemodynamic parameters for last 24 hours: PAP: (32-60)/(6-23) 49/15 mmHg CO:  [4.6 L/min-7 L/min] 5.5 L/min CI:  [2.3 L/min/m2-3.5 L/min/m2] 2.7 L/min/m2  Intake/Output from previous day: 05/19 0701 - 05/20 0700 In: 4025.5 [P.O.:200; I.V.:2875.5; IV Piggyback:300] Out: 1825 [Urine:1800; Blood:25] Intake/Output this shift: Total I/O In: 72 [I.V.:22; IV Piggyback:50] Out: 150 [Urine:150]  General appearance: alert and cooperative Neurologic: intact Heart: regular rate and rhythm, S1, S2 normal, no murmur, click, rub or gallop Lungs: clear to auscultation bilaterally Extremities: extremities normal, atraumatic, no cyanosis or edema Wound: left groin incision looks good. Right groin looks good.  Lab Results:  Recent Labs  11/17/13 2100 11/17/13 2113 11/18/13 0359  WBC 7.0  --  5.2  HGB 11.2* 10.5* 11.0*  HCT 32.7* 31.0* 32.0*  PLT 97*  --  86*   BMET:  Recent Labs  11/17/13 2113 11/18/13 0359  NA 141 140  K 4.4 4.8  CL 111 110  CO2  --  20  GLUCOSE 101* 102*  BUN 30* 30*  CREATININE 1.70* 1.72*  CALCIUM  --  8.2*    PT/INR:  Recent Labs  11/17/13 1500  LABPROT 15.6*  INR 1.27   ABG    Component Value Date/Time   PHART 7.333* 11/17/2013 1459   HCO3 20.2 11/17/2013 1459   TCO2 19 11/17/2013 2113   ACIDBASEDEF 6.0* 11/17/2013 1459   O2SAT 99.0  11/17/2013 1459   CBG (last 3)   Recent Labs  11/17/13 2359 11/18/13 0336 11/18/13 0801  GLUCAP 101* 100* 92    Assessment/Plan: S/P Procedure(s) (LRB): TRANSCATHETER AORTIC VALVE REPLACEMENT, TRANSFEMORAL (N/A) INTRAOPERATIVE TRANSESOPHAGEAL ECHOCARDIOGRAM (N/A)  Hemodynamically stable following TAVR. Resume antihypertensives. His HR is 64 now and ECG this am showed sinus brady 57 so will hold on beta blocker.  Chronic kidney failure: creat stable with good urine output. Will repeat tomorrow  Transfer to 2W and mobilize.   LOS: 1 day    Alleen BorneBryan K Bartle 11/18/2013

## 2013-11-18 NOTE — Progress Notes (Signed)
    Subjective:  Walked this am. Doing well. Mild soreness in left groin, no other complaints.  Objective:  Vital Signs in the last 24 hours: Temp:  [95.7 F (35.4 C)-96.8 F (36 C)] 96.6 F (35.9 C) (05/20 0800) Pulse Rate:  [45-73] 52 (05/20 0800) Resp:  [9-23] 14 (05/20 0800) BP: (117-188)/(34-56) 131/52 mmHg (05/20 0800) SpO2:  [98 %-100 %] 100 % (05/20 0800) Arterial Line BP: (123-247)/(41-75) 188/56 mmHg (05/20 0800) Weight:  [89.812 kg (198 lb)] 89.812 kg (198 lb) (05/20 0500)  Intake/Output from previous day: 05/19 0701 - 05/20 0700 In: 4025.5 [P.O.:200; I.V.:2875.5; IV Piggyback:300] Out: 1825 [Urine:1800; Blood:25]  Physical Exam: Pt is alert and oriented, NAD HEENT: normal Neck: JVP - normal Lungs: CTA bilaterally CV: RRR without murmur or gallop Abd: soft, NT, Positive BS, no hepatomegaly Ext: no C/C/E, distal pulses intact and equal, bilateral groin sites clear Skin: warm/dry no rash   Lab Results:  Recent Labs  11/17/13 2100 11/17/13 2113 11/18/13 0359  WBC 7.0  --  5.2  HGB 11.2* 10.5* 11.0*  PLT 97*  --  86*    Recent Labs  11/17/13 2113 11/18/13 0359  NA 141 140  K 4.4 4.8  CL 111 110  CO2  --  20  GLUCOSE 101* 102*  BUN 30* 30*  CREATININE 1.70* 1.72*   No results found for this basename: TROPONINI, CK, MB,  in the last 72 hours  Cardiac Studies: 2D Echo pending  Tele: Sinus rhythm  Assessment/Plan:  1. Severe Aortic Stenosis 2. Chronic diastolic heart failure, currently compensated 3. CKD, stage 3, stable  Pt looks good POD#1. Resume oral antihypertensives, wean NTG, and tx 2W. Antiplatelet Rx with ASA and plavix, but watch platelet count (repeat in am).   Tonny BollmanMichael Louine Tenpenny, M.D. 11/18/2013, 10:29 AM

## 2013-11-18 NOTE — Progress Notes (Signed)
  Echocardiogram 2D Echocardiogram has been performed.  [name] 11/18/2013, 12:26 PM

## 2013-11-18 NOTE — Progress Notes (Signed)
Dr. Laneta SimmersBartle made aware that after ambulation around unit, pt went into afib with rate 100-130s sustained. Orders received for amio load and infusion protocol. Will hold on transfer. Will continue to monitor closely.  Arva ChafeBlaire Sajjad Honea, RN

## 2013-11-18 NOTE — Progress Notes (Signed)
POD # 1 TAVR  BP 160/62  Pulse 90  Temp(Src) 98 F (36.7 C) (Oral)  Resp 17  Ht 5\' 10"  (1.778 m)  Wt 198 lb (89.812 kg)  BMI 28.41 kg/m2  SpO2 98%   Intake/Output Summary (Last 24 hours) at 11/18/13 1809 Last data filed at 11/18/13 1400  Gross per 24 hour  Intake 1352.37 ml  Output   2085 ml  Net -732.63 ml    C/o reflux/ heartburn  In a fib- started on amiodarone

## 2013-11-18 NOTE — Progress Notes (Signed)
Anesthesiology Follow-up:  Awake and alert, neuro intact, hemodynamically stable.  VS: T-35.9 BP 131/52 HR- 58 (SR) RR- 19 O2 Sat 98% on RA   K-4.8 BUN/Cr.- 30/1.72 glucose- 102 H/H- 11.0/32 Platelets- 86,000   Day #1 S/P TAVR, Renal function stable,  Doing well, as noted patient extubated in OR.  Craig Broodavid Damacio Weisgerber, MD

## 2013-11-18 NOTE — Anesthesia Postprocedure Evaluation (Signed)
  Anesthesia Post-op Note  Patient: [name]  Procedure(s) Performed: Procedure(s): TRANSCATHETER AORTIC VALVE REPLACEMENT, TRANSFEMORAL (N/A) INTRAOPERATIVE TRANSESOPHAGEAL ECHOCARDIOGRAM (N/A)  Patient Location: SICU  Anesthesia Type:General  Level of Consciousness: awake, alert  and oriented  Airway and Oxygen Therapy: Patient Spontanous Breathing  Post-op Pain: none  Post-op Assessment: Post-op Vital signs reviewed, Patient's Cardiovascular Status Stable, Respiratory Function Stable, Patent Airway and Pain level controlled  Post-op Vital Signs: stable  Last Vitals:  Filed Vitals:   11/18/13 1557  BP:   Pulse:   Temp: 36.7 C  Resp:     Complications: No apparent anesthesia complications

## 2013-11-18 NOTE — Progress Notes (Signed)
Dr. Excell Seltzerooper paged to notify that pt has recently started flipping between NSR (rate low 70s) and accelerated junctional (rate remains low 70s). Pt now off nitro gtt and BP stable. Another person answered pager for MD-message relayed.   Arva ChafeBlaire Cinda Hara, RN

## 2013-11-18 NOTE — Progress Notes (Signed)
Dr. Laneta SimmersBartle made aware of rhythm change (pt flipping between NSR and accelerated junctional). Also made aware pt off nitro gtt and BP doing well. Will continue to monitor closely.  Arva ChafeBlaire Yarieliz Wasser, RN

## 2013-11-18 NOTE — Evaluation (Signed)
Physical Therapy Evaluation Patient Details Name: Craig DingsJames H Dugal MRN: [id number] DOB: Nov 15, 1928 Today's Date: 11/18/2013   History of Present Illness  Pt admit for TAVR.    Clinical Impression  Pt admitted with above. Pt currently with functional limitations due to the deficits listed below (see PT Problem List).  Pt will benefit from skilled PT to increase their independence and safety with mobility to allow discharge to the venue listed below.    Follow Up Recommendations Home health PT;Supervision/Assistance - 24 hour    Equipment Recommendations  None recommended by PT    Recommendations for Other Services       Precautions / Restrictions Precautions Precautions: Fall Restrictions Weight Bearing Restrictions: No      Mobility  Bed Mobility Overal bed mobility: Needs Assistance Bed Mobility: Supine to Sit     Supine to sit: Min assist     General bed mobility comments: Pt needed assist for LEs and elevation of trunk.  Transfers Overall transfer level: Needs assistance Equipment used: Rolling walker (2 wheeled) Transfers: Sit to/from Stand Sit to Stand: Min assist         General transfer comment: Pt needed cues for hand placement.  Pt able to power up pretty well once cued.    Ambulation/Gait Ambulation/Gait assistance: Min assist Ambulation Distance (Feet): 180 Feet Assistive device: None (pt held to IV pole part of walk) Gait Pattern/deviations: Step-to pattern;Decreased stride length;Drifts right/left;Wide base of support   Gait velocity interpretation: Below normal speed for age/gender General Gait Details: Pt slightly unsteady with gait.  Needed steadying assist.  Cues for safety awareness.  HAs RW if he needs one on d/c.    Stairs            Wheelchair Mobility    Modified Rankin (Stroke Patients Only)       Balance Overall balance assessment: Needs assistance;History of Falls Sitting-balance support: No upper extremity  supported;Feet supported Sitting balance-Leahy Scale: Fair     Standing balance support: Single extremity supported;During functional activity Standing balance-Leahy Scale: Poor Standing balance comment: Pt needs single extremity supported for balance.                               Pertinent Vitals/Pain VSS, Some pain per pt but did not rate.    Home Living Family/patient expects to be discharged to:: Private residence Living Arrangements: Spouse/significant other Available Help at Discharge: Family;Available 24 hours/day Type of Home: House Home Access: Stairs to enter Entrance Stairs-Rails: None Entrance Stairs-Number of Steps: 1 Home Layout: Two level;Laundry or work area in basement;Able to live on main level with bedroom/bathroom Home Equipment: Environmental consultantWalker - 2 wheels;Cane - single point;Bedside commode;Hand held shower head      Prior Function Level of Independence: Independent               Hand Dominance   Dominant Hand: Right    Extremity/Trunk Assessment   Upper Extremity Assessment: Defer to OT evaluation           Lower Extremity Assessment: Generalized weakness         Communication   Communication: HOH  Cognition Arousal/Alertness: Awake/alert Behavior During Therapy: WFL for tasks assessed/performed Overall Cognitive Status: Within Functional Limits for tasks assessed                      General Comments      Exercises General Exercises - Lower  Extremity Ankle Circles/Pumps: AROM;Both;5 reps;Supine Heel Slides: AROM;Both;10 reps;Supine      Assessment/Plan    PT Assessment Patient needs continued PT services  PT Diagnosis Generalized weakness   PT Problem List Decreased activity tolerance;Decreased balance;Decreased mobility;Decreased knowledge of use of DME;Decreased safety awareness;Decreased knowledge of precautions  PT Treatment Interventions DME instruction;Gait training;Functional mobility  training;Therapeutic activities;Therapeutic exercise;Balance training;Patient/family education   PT Goals (Current goals can be found in the Care Plan section) Acute Rehab PT Goals Patient Stated Goal: to go home PT Goal Formulation: With patient Time For Goal Achievement: 11/25/13 Potential to Achieve Goals: Good    Frequency Min 3X/week   Barriers to discharge        Co-evaluation               End of Session Equipment Utilized During Treatment: Gait belt Activity Tolerance: Patient limited by fatigue Patient left: in chair;with call bell/phone within reach Nurse Communication: Mobility status         Time: 1000-1020 PT Time Calculation (min): 20 min   Charges:   PT Evaluation $Initial PT Evaluation Tier I: 1 Procedure PT Treatments $Gait Training: 8-22 mins   PT G CodesBarb Merino:          Tyger Wichman Ingold 11/18/2013, 12:17 PM Audree Camelawn Ingold,PT Acute Rehabilitation [phone] (pager)

## 2013-11-19 ENCOUNTER — Telehealth: Payer: Self-pay | Admitting: *Deleted

## 2013-11-19 DIAGNOSIS — I4891 Unspecified atrial fibrillation: Secondary | ICD-10-CM | POA: Diagnosis present

## 2013-11-19 DIAGNOSIS — N189 Chronic kidney disease, unspecified: Secondary | ICD-10-CM

## 2013-11-19 DIAGNOSIS — I10 Essential (primary) hypertension: Secondary | ICD-10-CM

## 2013-11-19 LAB — TYPE AND SCREEN
ABO/RH(D): O POS
Antibody Screen: NEGATIVE
UNIT DIVISION: 0
UNIT DIVISION: 0
UNIT DIVISION: 0
UNIT DIVISION: 0
Unit division: 0
Unit division: 0

## 2013-11-19 LAB — BASIC METABOLIC PANEL
BUN: 25 mg/dL — ABNORMAL HIGH (ref 6–23)
CHLORIDE: 108 meq/L (ref 96–112)
CO2: 20 meq/L (ref 19–32)
CREATININE: 1.82 mg/dL — AB (ref 0.50–1.35)
Calcium: 8.6 mg/dL (ref 8.4–10.5)
GFR calc Af Amer: 38 mL/min — ABNORMAL LOW (ref >90)
GFR calc non Af Amer: 33 mL/min — ABNORMAL LOW (ref >90)
GLUCOSE: 113 mg/dL — AB (ref 70–99)
Potassium: 4.5 mEq/L (ref 3.7–5.3)
Sodium: 142 mEq/L (ref 137–147)

## 2013-11-19 LAB — GLUCOSE, CAPILLARY
Glucose-Capillary: 119 mg/dL — ABNORMAL HIGH (ref 70–99)
Glucose-Capillary: 119 mg/dL — ABNORMAL HIGH (ref 70–99)
Glucose-Capillary: 97 mg/dL (ref 70–99)

## 2013-11-19 LAB — CBC
HCT: 34.1 % — ABNORMAL LOW (ref 39.0–52.0)
HEMOGLOBIN: 11.6 g/dL — AB (ref 13.0–17.0)
MCH: 31 pg (ref 26.0–34.0)
MCHC: 34 g/dL (ref 30.0–36.0)
MCV: 91.2 fL (ref 78.0–100.0)
PLATELETS: 102 10*3/uL — AB (ref 150–400)
RBC: 3.74 MIL/uL — AB (ref 4.22–5.81)
RDW: 13.3 % (ref 11.5–15.5)
WBC: 7.1 10*3/uL (ref 4.0–10.5)

## 2013-11-19 MED ORDER — AMIODARONE HCL 200 MG PO TABS
400.0000 mg | ORAL_TABLET | Freq: Two times a day (BID) | ORAL | Status: DC
  Administered 2013-11-19 – 2013-11-21 (×5): 400 mg via ORAL
  Filled 2013-11-19 (×6): qty 2

## 2013-11-19 MED ORDER — CLOPIDOGREL BISULFATE 75 MG PO TABS
75.0000 mg | ORAL_TABLET | Freq: Every day | ORAL | Status: DC
  Administered 2013-11-19: 75 mg via ORAL
  Filled 2013-11-19 (×3): qty 1

## 2013-11-19 MED ORDER — HYDRALAZINE HCL 25 MG PO TABS
25.0000 mg | ORAL_TABLET | Freq: Three times a day (TID) | ORAL | Status: DC
  Administered 2013-11-19 – 2013-11-21 (×7): 25 mg via ORAL
  Filled 2013-11-19 (×10): qty 1

## 2013-11-19 MED ORDER — ASPIRIN EC 81 MG PO TBEC
81.0000 mg | DELAYED_RELEASE_TABLET | Freq: Every day | ORAL | Status: DC
  Administered 2013-11-19 – 2013-11-21 (×3): 81 mg via ORAL
  Filled 2013-11-19 (×3): qty 1

## 2013-11-19 MED ORDER — ROSUVASTATIN CALCIUM 10 MG PO TABS
10.0000 mg | ORAL_TABLET | Freq: Every day | ORAL | Status: DC
  Administered 2013-11-19 – 2013-11-20 (×2): 10 mg via ORAL
  Filled 2013-11-19 (×3): qty 1

## 2013-11-19 MED FILL — Dextrose Inj 5%: INTRAVENOUS | Qty: 250 | Status: AC

## 2013-11-19 MED FILL — Potassium Chloride Inj 2 mEq/ML: INTRAVENOUS | Qty: 40 | Status: AC

## 2013-11-19 MED FILL — Magnesium Sulfate Inj 50%: INTRAMUSCULAR | Qty: 10 | Status: AC

## 2013-11-19 MED FILL — Norepinephrine Bitartrate IV Soln 1 MG/ML (Base Equivalent): INTRAVENOUS | Qty: 8 | Status: AC

## 2013-11-19 MED FILL — Heparin Sodium (Porcine) Inj 1000 Unit/ML: INTRAMUSCULAR | Qty: 30 | Status: AC

## 2013-11-19 MED FILL — Sodium Chloride IV Soln 0.9%: INTRAVENOUS | Qty: 1000 | Status: AC

## 2013-11-19 MED FILL — Phenylephrine HCl Inj 10 MG/ML: INTRAMUSCULAR | Qty: 2 | Status: AC

## 2013-11-19 MED FILL — Dexmedetomidine HCl IV Soln 200 MCG/2ML: INTRAVENOUS | Qty: 2 | Status: AC

## 2013-11-19 NOTE — Progress Notes (Addendum)
     SUBJECTIVE: Feels great this am.   BP 179/63  Pulse 65  Temp(Src) 98 F (36.7 C) (Oral)  Resp 18  Ht 5\' 10"  (1.778 m)  Wt 187 lb 13.3 oz (85.2 kg)  BMI 26.95 kg/m2  SpO2 95%  Intake/Output Summary (Last 24 hours) at 11/19/13 16100721 Last data filed at 11/19/13 0700  Gross per 24 hour  Intake 1632.18 ml  Output   1910 ml  Net -277.82 ml    PHYSICAL EXAM General: Well developed, well nourished, in no acute distress. Alert and oriented x 3.  Psych:  Good affect, responds appropriately Neck: No JVD. No masses noted.  Lungs: Clear bilaterally with no wheezes or rhonci noted.  Heart: RRR with no murmurs noted. Abdomen: Bowel sounds are present. Soft, non-tender.  Extremities: No lower extremity edema. Groin wounds stable.   LABS: Basic Metabolic Panel:  Recent Labs  96/10/5403/19/15 2100  11/18/13 0359 11/19/13 0500  NA  --   < > 140 142  K  --   < > 4.8 4.5  CL  --   < > 110 108  CO2  --   --  20 20  GLUCOSE  --   < > 102* 113*  BUN  --   < > 30* 25*  CREATININE 1.64*  < > 1.72* 1.82*  CALCIUM  --   --  8.2* 8.6  MG 1.9  --  1.9  --   < > = values in this interval not displayed. CBC:  Recent Labs  11/18/13 0359 11/19/13 0500  WBC 5.2 7.1  HGB 11.0* 11.6*  HCT 32.0* 34.1*  MCV 91.2 91.2  PLT 86* 102*   Current Meds: . docusate sodium  200 mg Oral Daily  . hydrALAZINE  10 mg Oral 3 times per day  . insulin aspart  0-24 Units Subcutaneous 6 times per day  . isosorbide mononitrate  15 mg Oral Daily  . pantoprazole  40 mg Oral QAC breakfast  . simvastatin  40 mg Oral QPM  . sodium chloride  3 mL Intravenous Q12H    ASSESSMENT AND PLAN:  1. Severe Aortic Stenosis: pt is s/p TAVR 11/17/13. Start Plavix today.   2. Chronic diastolic heart failure: Volume status is ok.    3. CKD, stage 3:  Stable  4. New onset atrial fibrillation: Sinus this am on IV amiodarone. Stop IV amio today and convert to po.   5. HTN: Increase Hydralazine  Transfer to telemetry  today. Hopefully home tomorrow if stable.    Kathleene HazelChristopher D McAlhany  5/21/20157:21 AM

## 2013-11-19 NOTE — Telephone Encounter (Signed)
He will have 30 day post op appt with Dr. Laneta SimmersBartle and Dr. Excell Seltzerooper on Friday 6/19pm.  This will include CXR/echo and f/u visit.  Patient's daughter is aware.  I will mail info once everything is scheduled.

## 2013-11-19 NOTE — Progress Notes (Signed)
2 Days Post-Op Procedure(s) (LRB): TRANSCATHETER AORTIC VALVE REPLACEMENT, TRANSFEMORAL (N/A) INTRAOPERATIVE TRANSESOPHAGEAL ECHOCARDIOGRAM (N/A) Subjective: No complaints  Objective: Vital signs in last 24 hours: Temp:  [97.3 F (36.3 C)-98.3 F (36.8 C)] 97.7 F (36.5 C) (05/21 0803) Pulse Rate:  [55-123] 65 (05/21 0700) Cardiac Rhythm:  [-] Normal sinus rhythm (05/21 0751) Resp:  [14-28] 18 (05/21 0700) BP: (107-193)/(35-105) 179/63 mmHg (05/21 0700) SpO2:  [93 %-100 %] 95 % (05/21 0700) Arterial Line BP: (199)/(64) 199/64 mmHg (05/20 0830) Weight:  [85.2 kg (187 lb 13.3 oz)] 85.2 kg (187 lb 13.3 oz) (05/21 0600)  Hemodynamic parameters for last 24 hours:    Intake/Output from previous day: 05/20 0701 - 05/21 0700 In: 1632.2 [P.O.:840; I.V.:742.2; IV Piggyback:50] Out: 1910 [Urine:1910] Intake/Output this shift:    General appearance: alert and cooperative Neurologic: intact Heart: regular rate and rhythm, S1, S2 normal, no murmur, click, rub or gallop Lungs: clear to auscultation bilaterally Extremities: extremities normal, atraumatic, no cyanosis or edema Wound: left groin incision looks good.  Lab Results:  Recent Labs  11/18/13 0359 11/19/13 0500  WBC 5.2 7.1  HGB 11.0* 11.6*  HCT 32.0* 34.1*  PLT 86* 102*   BMET:  Recent Labs  11/18/13 0359 11/19/13 0500  NA 140 142  K 4.8 4.5  CL 110 108  CO2 20 20  GLUCOSE 102* 113*  BUN 30* 25*  CREATININE 1.72* 1.82*  CALCIUM 8.2* 8.6    PT/INR:  Recent Labs  11/17/13 1500  LABPROT 15.6*  INR 1.27   ABG    Component Value Date/Time   PHART 7.333* 11/17/2013 1459   HCO3 20.2 11/17/2013 1459   TCO2 19 11/17/2013 2113   ACIDBASEDEF 6.0* 11/17/2013 1459   O2SAT 99.0 11/17/2013 1459   CBG (last 3)   Recent Labs  11/19/13 0002 11/19/13 0359 11/19/13 0757  GLUCAP 97 119* 119*    2D echo reportedly done yesterday. Result pending  Assessment/Plan: S/P Procedure(s) (LRB): TRANSCATHETER AORTIC  VALVE REPLACEMENT, TRANSFEMORAL (N/A) INTRAOPERATIVE TRANSESOPHAGEAL ECHOCARDIOGRAM (N/A)  S/P TAVR Hypertensive. Will increase hydralazine to 25 tid. Not resuming beta blocker yet since his HR has been in the 60's postop and now on amiodarone.  Postop atrial fib: converted at 2 am on IV amio. Will switch to po. Plan to continue until we see him back in the office.  ASA and Plavix.  Chronic kidney disease: creatinine is at baseline and lower than preop of 2.1.   Transfer to 2W and continue mobilization    LOS: 2 days    [name] 11/19/2013

## 2013-11-19 NOTE — Progress Notes (Signed)
Pt c/o abdominal pain and things he needs to have a bowel movement.  Pt has received PO Dulcolax today to assist with process.  Pt had just returned from bathroom about to take night time medications when he started to feel nauseous and immediately vomited 240mL of brown emesis, which the pt confirmed what the prune juice he just consumed.  Pt was given 4mg  of Zofran IV, ice chips, and a cool rag.  VS 155/63 and heart rate 86. Pt had some relief sitting on side. Call light in reach, RN will continue to monitor.   Thane EduKimberly Hecker, RN

## 2013-11-20 ENCOUNTER — Encounter (HOSPITAL_COMMUNITY): Payer: Self-pay | Admitting: Cardiovascular Disease

## 2013-11-20 DIAGNOSIS — Z954 Presence of other heart-valve replacement: Secondary | ICD-10-CM

## 2013-11-20 DIAGNOSIS — K5900 Constipation, unspecified: Secondary | ICD-10-CM

## 2013-11-20 LAB — CBC
HCT: 33.2 % — ABNORMAL LOW (ref 39.0–52.0)
HEMOGLOBIN: 11.2 g/dL — AB (ref 13.0–17.0)
MCH: 30.9 pg (ref 26.0–34.0)
MCHC: 33.7 g/dL (ref 30.0–36.0)
MCV: 91.5 fL (ref 78.0–100.0)
Platelets: 103 10*3/uL — ABNORMAL LOW (ref 150–400)
RBC: 3.63 MIL/uL — ABNORMAL LOW (ref 4.22–5.81)
RDW: 13.3 % (ref 11.5–15.5)
WBC: 7.6 10*3/uL (ref 4.0–10.5)

## 2013-11-20 LAB — BASIC METABOLIC PANEL
BUN: 26 mg/dL — ABNORMAL HIGH (ref 6–23)
CHLORIDE: 103 meq/L (ref 96–112)
CO2: 22 mEq/L (ref 19–32)
CREATININE: 2.02 mg/dL — AB (ref 0.50–1.35)
Calcium: 8.7 mg/dL (ref 8.4–10.5)
GFR calc Af Amer: 34 mL/min — ABNORMAL LOW (ref >90)
GFR calc non Af Amer: 29 mL/min — ABNORMAL LOW (ref >90)
Glucose, Bld: 110 mg/dL — ABNORMAL HIGH (ref 70–99)
Potassium: 4.4 mEq/L (ref 3.7–5.3)
Sodium: 137 mEq/L (ref 137–147)

## 2013-11-20 MED ORDER — AMIODARONE IV BOLUS ONLY 150 MG/100ML
150.0000 mg | Freq: Once | INTRAVENOUS | Status: AC
  Administered 2013-11-20: 150 mg via INTRAVENOUS
  Filled 2013-11-20 (×2): qty 100

## 2013-11-20 MED ORDER — POLYETHYLENE GLYCOL 3350 17 G PO PACK
17.0000 g | PACK | Freq: Two times a day (BID) | ORAL | Status: DC
  Administered 2013-11-20 – 2013-11-21 (×3): 17 g via ORAL
  Filled 2013-11-20 (×4): qty 1

## 2013-11-20 MED ORDER — WARFARIN SODIUM 5 MG PO TABS
5.0000 mg | ORAL_TABLET | Freq: Once | ORAL | Status: AC
  Administered 2013-11-20: 5 mg via ORAL
  Filled 2013-11-20: qty 1

## 2013-11-20 MED ORDER — METOPROLOL TARTRATE 25 MG PO TABS
25.0000 mg | ORAL_TABLET | Freq: Two times a day (BID) | ORAL | Status: DC
  Administered 2013-11-20 – 2013-11-21 (×3): 25 mg via ORAL
  Filled 2013-11-20 (×4): qty 1

## 2013-11-20 MED ORDER — LACTULOSE 10 GM/15ML PO SOLN
10.0000 g | Freq: Every day | ORAL | Status: DC | PRN
  Administered 2013-11-20: 10 g via ORAL
  Filled 2013-11-20 (×2): qty 15

## 2013-11-20 MED ORDER — WARFARIN - PHARMACIST DOSING INPATIENT: Freq: Every day | Status: DC

## 2013-11-20 NOTE — Progress Notes (Signed)
11/20/2013 0930 Clarified with [name] that pt. Was to receive IV amiodarone bolus in addition to scheduled PO dose of Amiodarone this morning. Pt. Updated on plan of care. Will continue to monitor patient.  Blanchard Kelch Navarro [name]

## 2013-11-20 NOTE — Progress Notes (Signed)
ANTICOAGULATION CONSULT NOTE - Initial Consult  Pharmacy Consult for Warfarin Indication: Afib  Allergies  Allergen Reactions  . Lipitor [Atorvastatin Calcium]     Leg pain   Patient Measurements: Height: 5\' 10"  (177.8 cm) Weight: 185 lb 6.5 oz (84.1 kg) IBW/kg (Calculated) : 73  Vital Signs: Temp: 98.5 F (36.9 C) (05/22 0449) Temp src: Oral (05/22 0454) BP: 152/63 mmHg (05/22 0449) Pulse Rate: 86 (05/22 0449)  Labs:  Recent Labs  11/17/13 1500 11/17/13 2100 11/17/13 2113 11/18/13 0359 11/19/13 0500  HGB 10.5* 11.2* 10.5* 11.0* 11.6*  HCT 30.7* 32.7* 31.0* 32.0* 34.1*  PLT 99* 97*  --  86* 102*  APTT 35  --   --   --   --   LABPROT 15.6*  --   --   --   --   INR 1.27  --   --   --   --   CREATININE  --  1.64* 1.70* 1.72* 1.82*   Estimated Creatinine Clearance: 32.3 ml/min (by C-G formula based on Cr of 1.82).  Medical History: Past Medical History  Diagnosis Date  . Coronary atherosclerosis of native coronary artery     Multivessel, LVEF 65%  . Mixed hyperlipidemia   . Essential hypertension, benign   . Myocardial infarction   . Renal insufficiency   . Arthritis   . Aortic stenosis   . Anginal pain     last 2 weeks  . Heart murmur   . 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  He has new onset Afib and was started on Amiodarone.  He is currently on a loading dose of this at 400mg  twice daily.  We have been asked to start oral anticoagulation with Warfarin.  There is potential drug/drug interaction with Amiodarone and warfarin therefore we will start with lower dose and titrate.  His baseline CBC is stable but does show some mild thrombocytopenia placing him at increased risk for bleeding.  Goal of Therapy:  INR 2-3 Monitor platelets by anticoagulation protocol: Yes   Plan:  1.   Will give Warfarin 5 mg po x1 2.  Daily PT/INR 3.  Monitor bleeding complications  Nadara Mustard, PharmD., MS Clinical Pharmacist Pager:  [phone] Thank you for allowing pharmacy to be part of this patients care team. 11/20/2013,7:25 AM

## 2013-11-20 NOTE — Progress Notes (Addendum)
       [address]       [name] [address]             [phone]          3 Days Post-Op Procedure(s) (LRB): TRANSCATHETER AORTIC VALVE REPLACEMENT, TRANSFEMORAL (N/A) INTRAOPERATIVE TRANSESOPHAGEAL ECHOCARDIOGRAM (N/A)  Subjective: Nausea and vomiting overnight, feels a little better this am except still constipated.   Objective: Vital signs in last 24 hours: Patient Vitals for the past 24 hrs:  BP Temp Temp src Pulse Resp SpO2 Weight  11/20/13 0500 - - - - - - 185 lb 6.5 oz (84.1 kg)  11/20/13 0454 - - Oral - - - -  11/20/13 0449 152/63 mmHg 98.5 F (36.9 C) - 86 20 - -  11/19/13 2249 155/63 mmHg 98.1 F (36.7 C) Oral 86 20 96 % -  11/19/13 2059 188/64 mmHg 97.7 F (36.5 C) Oral 72 18 98 % -  11/19/13 1536 158/61 mmHg - - - - - -  11/19/13 1300 201/60 mmHg 97.9 F (36.6 C) Oral 79 20 98 % -  11/19/13 1239 - 97.4 F (36.3 C) Oral - - - -  11/19/13 1200 180/56 mmHg - - - 19 96 % -  11/19/13 1100 183/56 mmHg - - - 19 - -  11/19/13 1000 178/73 mmHg - - 69 20 96 % -  11/19/13 0900 180/64 mmHg - - 66 13 98 % -  11/19/13 0803 - 97.7 F (36.5 C) - - - - -  11/19/13 0800 186/60 mmHg - - 63 13 96 % -   Current Weight  11/20/13 185 lb 6.5 oz (84.1 kg)     Intake/Output from previous day: 05/21 0701 - 05/22 0700 In: 240 [P.O.:240] Out: 400 [Urine:400]    PHYSICAL EXAM:  Heart: Irr irr,rates 100s Lungs: Clear Abdomen: Soft, NT/ND, +BS Extremities: No edema    Lab Results: CBC: Recent Labs  11/18/13 0359 11/19/13 0500  WBC 5.2 7.1  HGB 11.0* 11.6*  HCT 32.0* 34.1*  PLT 86* 102*   BMET:  Recent Labs  11/18/13 0359 11/19/13 0500  NA 140 142  K 4.8 4.5  CL 110 108  CO2 20 20  GLUCOSE 102* 113*  BUN 30* 25*  CREATININE 1.72* 1.82*  CALCIUM 8.2* 8.6    PT/INR:  Recent Labs  11/17/13 1500  LABPROT 15.6*  INR 1.27      Assessment/Plan: S/P Procedure(s) (LRB): TRANSCATHETER AORTIC VALVE REPLACEMENT, TRANSFEMORAL  (N/A) INTRAOPERATIVE TRANSESOPHAGEAL ECHOCARDIOGRAM (N/A)  CV- Back in AF this am, rates 100s.  Discussed with Dr. Laneta Simmers- will give a dose of IV Amio this am and continue po Amio, Coumadin.  BPs trending back up, will resume beta blocker at lower dose.  GI- vomited overnight but feeling better this am. LOC today.  CKD- Cr back to baseline. Continue to follow.  Ambulate, pulm toilet.  Home in am if remains stable.   LOS: 3 days    [name] 11/20/2013   Chart reviewed, patient examined, agree with above. His rate is getting up into the 110-120 range this am so will give a bolus of amio and resume some lopressor.

## 2013-11-20 NOTE — Progress Notes (Signed)
11/20/2013 1400 Nursing note Pt. Back in NSR. [name] paged and made aware. No new orders at this time. Will continue to monitor patient.  Blanchard Kelch Jaevin Medearis

## 2013-11-20 NOTE — Progress Notes (Signed)
CARDIAC REHAB PHASE I   PRE:  Rate/Rhythm: 63 SR  BP:  Supine:   Sitting: 100/50  Standing:    SaO2: 99 RA  MODE:  Ambulation: 1100 ft   POST:  Rate/Rhythm: 87  BP:  Supine:   Sitting: 120/60  Standing:    SaO2: 98 RA 1330-1430 Assisted X 1 and used walker to ambulate. Gait steady with walker. Pt able to walk 1100 feet without c/o. VS stable. Pt to recliner after walk. Completed discharge education with pt. He declines Outpt CRP, not interested.Pt c/o of constipation, his nurse is giving him medications for it.  Melina Copa RN 11/20/2013 2:35 PM

## 2013-11-20 NOTE — Progress Notes (Signed)
     SUBJECTIVE: Feels ok this am. "Rough night". Constipation and one episode of vomiting. No chest pain or SOB.   BP 152/63  Pulse 86  Temp(Src) 98.5 F (36.9 C) (Oral)  Resp 20  Ht 5\' 10"  (1.778 m)  Wt 187 lb 13.3 oz (85.2 kg)  BMI 26.95 kg/m2  SpO2 96%  Intake/Output Summary (Last 24 hours) at 11/20/13 0644 Last data filed at 11/19/13 1700  Gross per 24 hour  Intake  276.7 ml  Output    400 ml  Net -123.3 ml    PHYSICAL EXAM General: Well developed, well nourished, in no acute distress. Alert and oriented x 3.  Psych:  Good affect, responds appropriately Neck: No JVD. No masses noted.  Lungs: Clear bilaterally with no wheezes or rhonci noted.  Heart: RRR with no murmurs noted. Abdomen: Bowel sounds are present. Soft, non-tender.  Extremities: No lower extremity edema.   LABS: Basic Metabolic Panel:  Recent Labs  68/34/19 2100  11/18/13 0359 11/19/13 0500  NA  --   < > 140 142  K  --   < > 4.8 4.5  CL  --   < > 110 108  CO2  --   --  20 20  GLUCOSE  --   < > 102* 113*  BUN  --   < > 30* 25*  CREATININE 1.64*  < > 1.72* 1.82*  CALCIUM  --   --  8.2* 8.6  MG 1.9  --  1.9  --   < > = values in this interval not displayed. CBC:  Recent Labs  11/18/13 0359 11/19/13 0500  WBC 5.2 7.1  HGB 11.0* 11.6*  HCT 32.0* 34.1*  MCV 91.2 91.2  PLT 86* 102*   Current Meds: . amiodarone  400 mg Oral BID  . aspirin EC  81 mg Oral Daily  . clopidogrel  75 mg Oral Q breakfast  . docusate sodium  200 mg Oral Daily  . hydrALAZINE  25 mg Oral 3 times per day  . isosorbide mononitrate  15 mg Oral Daily  . pantoprazole  40 mg Oral QAC breakfast  . rosuvastatin  10 mg Oral q1800  . sodium chloride  3 mL Intravenous Q12H    ASSESSMENT AND PLAN:   1. Severe Aortic Stenosis: pt is s/p TAVR 11/17/13. Doing well. Continue ASA and Plavix.    2. Chronic diastolic heart failure: Volume status is ok.   3. CKD, stage 3: Stable. Repeat BMET pending today.   4. New onset  atrial fibrillation: Back in atrial fib this am. Looks like he converted after 5am. Now on po amiodarone and rate controlled. Will discuss with Dr. Laneta Simmers. Will need to stop Plavix and start coumadin. I do not think cardioversion is indicated as he will likely convert back into atrial fib if cardioverted at this point. Plan should be rate control and anti-coagulation.    5. HTN: BP better on increased dose of Hydralazine  6. Constipation: Will start Miralax.   Should be ready for discharge by tomorrow. Ambulate today. Miralax for constipation. Start anti-coagulation with coumadin. Stop Plavix. Follow up after discharge will be on 12/18/13 with Dr. Excell Seltzer and Laneta Simmers in TAVR clinic. This will be arranged by our TAVR coordinator and he will be contacted with times.    Kathleene Hazel  5/22/20156:44 AM

## 2013-11-20 NOTE — Discharge Summary (Signed)
[address]       [name] [address]             [phone]              Discharge Summary  Name: [name] DOB: [birth year] 80 y.o. MRN: [id number]   Admission Date: 11/17/2013 Discharge Date: 11/20/2013    Admitting Diagnosis: Severe aortic stenosis   Discharge Diagnosis:  Severe aortic stenosis Chronic diastolic heart failure Postoperative atrial fibrillation Stage 3 chronic kidney disease  Past Medical History  Diagnosis Date  . Coronary atherosclerosis of native coronary artery     Multivessel, LVEF 65%  . Mixed hyperlipidemia   . Essential hypertension, benign   . Myocardial infarction   . Renal insufficiency   . Arthritis   . Aortic stenosis   . Anginal pain     last 2 weeks  . Heart murmur   . 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 male who underwent CABG x 4 by Dr. Dorris Fetch in 2004 after presenting with a NSTEMI. He had a LIMA to the LAD, SVG to diagonal, SVG to OM1, and a SVG to the distal RCA. He has done well until the past year when he began having chest discomfort, shortness of breath and fatigue with exertion. 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 He has a history of aortic stenosis and had a repeat 2D-echo on 09/10/2013 showing significant progression with a mean transvalvular gradient of 43 mm Hg and a peak gradient of 70 mm Hg. The AVA was 0.82 cm2. LVEF was 65-70%.  Cardiac cath showed severe double vessel CAD with high grade proximal LAD stenosis and total occlusion of the RCA. The LCX was patent. The  LIMA graft was patent as was the SVG's to the diagonal and distal RCA. The SVG to the OM was occluded. His peak to peak gradient was 40, mean gradient 32, and AVA 1.0 cm2. He was seen by Dr. Dorris Fetch, Dr. Excell Seltzer and Dr. Laneta Simmers and was felt to be a TAVR candidate. All risks, benefits and alternatives of surgery were explained in detail, and the patient agreed to proceed.    Hospital Course:  The patient was admitted to [facility] on 11/17/2013. The patient was taken to the operating room and underwent the above procedure.    The postoperative course was notable for bradycardia early on.  He was not started on a beta blocker. He then developed atrial fibrillation and was started on Amiodarone.  He did convert to sinus rhythm, but had a recurrence of atrial fib on 11/21/2103.  At that time, his rate was around 100, and since blood pressures were trending up, he was started on a low dose beta blocker.  He was also started on Coumadin therapy.  He will be discharged home on a regimen of 2.5 mg daily with a goal INR of 2.0-3.0.  He will need to have a PT/INR drawn on Tuesday 11/24/13 with results faxed to Dr. Orson Gear office.  His creatinine has remained stable  postoperatively, around his baseline 1.8-2.0.  The patient has been ambulating in the halls and tolerating a regular diet.  Overall, he is progressing well.  We anticipate discharge in 24-48 hours provided his heart rate remains stable and no other acute changes occur.   Recent vital signs:  Filed Vitals:   11/20/13 1050  BP: 138/62  Pulse: 85  Temp:   Resp:     Recent laboratory studies:  CBC: Recent Labs  11/19/13 0500 11/20/13 0922  WBC 7.1 7.6  HGB 11.6* 11.2*  HCT 34.1* 33.2*  PLT 102* 103*   BMET:  Recent Labs  11/19/13 0500 11/20/13 0922  NA 142 137  K 4.5 4.4  CL 108 103  CO2 20 22  GLUCOSE 113* 110*  BUN 25* 26*  CREATININE 1.82* 2.02*  CALCIUM 8.6 8.7    PT/INR:  Recent Labs  11/17/13 1500  LABPROT 15.6*    INR 1.27     Discharge Medications:    The patient has been discharged on:   1.Beta Blocker:  Yes [x   ]                              No   [   ]                              If No, reason:  2.Ace Inhibitor/ARB: Yes [   ]                                     No  [  x  ]                                     If No, reason: elevated creatinine  3.Statin:   Yes [ x  ]                  No  [   ]                  If No, reason:  4.Ecasa:  Yes  [ x  ]                  No   [   ]                  If No, reason:       Medication List         acetaminophen 325 MG tablet  Commonly known as:  TYLENOL  Take 650 mg by mouth every 6 (six) hours as needed for mild pain.     amiodarone 400 MG tablet  Commonly known as:  PACERONE  Take 1 tablet (400 mg total) by mouth 2 (two) times daily.     aspirin 81 MG tablet  Take 81 mg by mouth daily.     famotidine 20 MG tablet  Commonly known as:  PEPCID  Take 20 mg by mouth 2 (two) times daily as needed for heartburn.     Fish Oil 1200 MG Caps  Take 1 capsule by mouth 3 (three) times daily.     hydrALAZINE 25 MG tablet  Commonly known as:  APRESOLINE  Take 1 tablet (25 mg  total) by mouth every 8 (eight) hours.     isosorbide mononitrate 30 MG 24 hr tablet  Commonly known as:  IMDUR  Take 15 mg by mouth daily.     metoprolol tartrate 25 MG tablet  Commonly known as:  LOPRESSOR  Take 1 tablet (25 mg total) by mouth 2 (two) times daily.     nitroGLYCERIN 0.4 MG SL tablet  Commonly known as:  NITROSTAT  Place 1 tablet (0.4 mg total) under the tongue every 5 (five) minutes as needed.     oxyCODONE 5 MG immediate release tablet  Commonly known as:  Oxy IR/ROXICODONE  Take 1-2 tablets (5-10 mg total) by mouth every 4 (four) hours as needed for severe pain.     simvastatin 40 MG tablet  Commonly known as:  ZOCOR  Take 1 tablet (40 mg total) by mouth every evening.     warfarin 2.5 MG tablet  Commonly known as:  COUMADIN  Take  1 tablet (2.5 mg total) by mouth daily.          Discharge Instructions:  The patient is to refrain from driving, heavy lifting or strenuous activity.  May shower daily and clean incisions with soap and water.  May resume regular diet.   Follow Up: Follow-up Information   Follow up with [name], MD. (TAVR coordinator will arrange outpatient follow up )    Specialty:  Cardiology   Contact information:   [address] [place] [address] [phone]       Follow up with Alleen Borne, MD. (TAVR coordinator will arrange outpatient follow up )    Specialty:  Cardiothoracic Surgery   Contact information:   [address] [place] [address] [phone]            Wilmon Pali 11/20/2013, 11:38 AM

## 2013-11-21 DIAGNOSIS — I2510 Atherosclerotic heart disease of native coronary artery without angina pectoris: Secondary | ICD-10-CM

## 2013-11-21 LAB — PROTIME-INR
INR: 1.04 (ref 0.00–1.49)
Prothrombin Time: 13.4 seconds (ref 11.6–15.2)

## 2013-11-21 MED ORDER — AMIODARONE HCL 400 MG PO TABS: 400.0000 mg | ORAL_TABLET | Freq: Two times a day (BID) | ORAL | Status: DC

## 2013-11-21 MED ORDER — WARFARIN SODIUM 2.5 MG PO TABS: 2.5000 mg | ORAL_TABLET | Freq: Every day | ORAL | Status: DC

## 2013-11-21 MED ORDER — METOPROLOL TARTRATE 25 MG PO TABS: 25.0000 mg | ORAL_TABLET | Freq: Two times a day (BID) | ORAL | Status: DC

## 2013-11-21 MED ORDER — OXYCODONE HCL 5 MG PO TABS: 5.0000 mg | ORAL_TABLET | ORAL | Status: DC | PRN

## 2013-11-21 MED ORDER — WARFARIN SODIUM 5 MG PO TABS
5.0000 mg | ORAL_TABLET | Freq: Every day | ORAL | Status: DC
Start: 2013-11-21 — End: 2013-11-21

## 2013-11-21 MED ORDER — HYDRALAZINE HCL 25 MG PO TABS: 25.0000 mg | ORAL_TABLET | Freq: Three times a day (TID) | ORAL | Status: DC

## 2013-11-21 NOTE — Progress Notes (Signed)
Subjective:  POD #4 transfemoral TAVR. Feels and looks great!!!  Objective:  Temp:  [97.5 F (36.4 C)-98.1 F (36.7 C)] 98.1 F (36.7 C) (05/23 0436) Pulse Rate:  [60-85] 60 (05/23 0436) Resp:  [18-20] 20 (05/23 0436) BP: (118-155)/(49-62) 155/49 mmHg (05/23 0436) SpO2:  [97 %-99 %] 97 % (05/23 0436) Weight:  [185 lb 14.4 oz (84.324 kg)] 185 lb 14.4 oz (84.324 kg) (05/23 0436) Weight change: 7.9 oz (0.224 kg)  Intake/Output from previous day: 05/22 0701 - 05/23 0700 In: 720 [P.O.:720] Out: -   Intake/Output from this shift:    Physical Exam: General appearance: alert and no distress Neck: no adenopathy, no carotid bruit, no JVD, supple, symmetrical, trachea midline and thyroid not enlarged, symmetric, no tenderness/mass/nodules Lungs: clear to auscultation bilaterally Heart: Soft outflow tract murmur Extremities: Right fem arterial puncture site OK. Left Fem cut down looks good as well  Lab Results: Results for orders placed during the hospital encounter of 11/17/13 (from the past 48 hour(s))  BASIC METABOLIC PANEL     Status: Abnormal   Collection Time    11/20/13  9:22 AM      Result Value Ref Range   Sodium 137  137 - 147 mEq/L   Potassium 4.4  3.7 - 5.3 mEq/L   Chloride 103  96 - 112 mEq/L   CO2 22  19 - 32 mEq/L   Glucose, Bld 110 (*) 70 - 99 mg/dL   BUN 26 (*) 6 - 23 mg/dL   Creatinine, Ser 2.02 (*) 0.50 - 1.35 mg/dL   Calcium 8.7  8.4 - 10.5 mg/dL   GFR calc non Af Amer 29 (*) >90 mL/min   GFR calc Af Amer 34 (*) >90 mL/min   Comment: (NOTE)     The eGFR has been calculated using the CKD EPI equation.     This calculation has not been validated in all clinical situations.     eGFR's persistently <90 mL/min signify possible Chronic Kidney     Disease.  CBC     Status: Abnormal   Collection Time    11/20/13  9:22 AM      Result Value Ref Range   WBC 7.6  4.0 - 10.5 K/uL   RBC 3.63 (*) 4.22 - 5.81 MIL/uL   Hemoglobin 11.2 (*) 13.0 - 17.0 g/dL   HCT 33.2 (*) 39.0 - 52.0 %   MCV 91.5  78.0 - 100.0 fL   MCH 30.9  26.0 - 34.0 pg   MCHC 33.7  30.0 - 36.0 g/dL   RDW 13.3  11.5 - 15.5 %   Platelets 103 (*) 150 - 400 K/uL   Comment: CONSISTENT WITH PREVIOUS RESULT  PROTIME-INR     Status: None   Collection Time    11/21/13  3:10 AM      Result Value Ref Range   Prothrombin Time 13.4  11.6 - 15.2 seconds   INR 1.04  0.00 - 1.49    Imaging: Imaging results have been reviewed  Tele: NSR  Assessment/Plan:   1. Principal Problem: 2.   S/P TAVR (transcatheter aortic valve replacement) 3. Active Problems: 4.   Aortic valve disorders 5.   Atrial fibrillation 6.   Constipation 7.   Time Spent Directly with Patient:  30 minutes  Length of Stay:  LOS: 4 days   POD # 4 TAVR. Ambulating w/o difficulty. PAF currently NSR. On PO amio/BB. Plavix D/Cd and coumadin AC begun. INR sub theraputic  but OK for DC. Will need close OP followup of INR in coumadin clinic this week. SCRr mildly elevated. Can get BMET when and INR is done. Appt with TAVR clinic.  Lorretta Harp 11/21/2013, 9:23 AM

## 2013-11-21 NOTE — Progress Notes (Signed)
Patient D/C'd to home with wife and daughter. D/C instructions reviewed with patient and family and they state no questions.  Prescriptions given to patient.  IV and telemetry D/C. Patient transferred to patient pickup via wheelchair by volunteer.

## 2013-11-21 NOTE — Progress Notes (Signed)
      [address]       [name] [address]             [phone]      4 Days Post-Op Procedure(s) (LRB): TRANSCATHETER AORTIC VALVE REPLACEMENT, TRANSFEMORAL (N/A) INTRAOPERATIVE TRANSESOPHAGEAL ECHOCARDIOGRAM (N/A)  Subjective:  [name] has no complaints this morning.  He states he is doing much better since he moved his bowels yesterday.  He is ambulating with minimal difficulty.  Objective: Vital signs in last 24 hours: Temp:  [97.5 F (36.4 C)-98.1 F (36.7 C)] 98.1 F (36.7 C) (05/23 0436) Pulse Rate:  [60-85] 60 (05/23 0436) Cardiac Rhythm:  [-] Normal sinus rhythm (05/22 1945) Resp:  [18-20] 20 (05/23 0436) BP: (118-155)/(49-62) 155/49 mmHg (05/23 0436) SpO2:  [97 %-99 %] 97 % (05/23 0436) Weight:  [185 lb 14.4 oz (84.324 kg)] 185 lb 14.4 oz (84.324 kg) (05/23 0436)  Intake/Output from previous day: 05/22 0701 - 05/23 0700 In: 720 [P.O.:720] Out: -   General appearance: alert, cooperative and no distress Heart: regular rate and rhythm Lungs: clear to auscultation bilaterally Abdomen: soft, non-tender; bowel sounds normal; no masses,  no organomegaly Extremities: extremities normal, atraumatic, no cyanosis or edema  Lab Results:  Recent Labs  11/19/13 0500 11/20/13 0922  WBC 7.1 7.6  HGB 11.6* 11.2*  HCT 34.1* 33.2*  PLT 102* 103*   BMET:  Recent Labs  11/19/13 0500 11/20/13 0922  NA 142 137  K 4.5 4.4  CL 108 103  CO2 20 22  GLUCOSE 113* 110*  BUN 25* 26*  CREATININE 1.82* 2.02*  CALCIUM 8.6 8.7    PT/INR:  Recent Labs  11/21/13 0310  LABPROT 13.4  INR 1.04   ABG    Component Value Date/Time   PHART 7.333* 11/17/2013 1459   HCO3 20.2 11/17/2013 1459   TCO2 19 11/17/2013 2113   ACIDBASEDEF 6.0* 11/17/2013 1459   O2SAT 99.0 11/17/2013 1459   CBG (last 3)   Recent Labs  11/19/13 0002 11/19/13 0359 11/19/13 0757  GLUCAP 97 119* 119*    Assessment/Plan: S/P Procedure(s) (LRB): TRANSCATHETER AORTIC VALVE  REPLACEMENT, TRANSFEMORAL (N/A) INTRAOPERATIVE TRANSESOPHAGEAL ECHOCARDIOGRAM (N/A)  1. CV- Previous A. Fib, currently maintaining NSR, + Hypertension- continue Amiodarone, Lopressor, Hydralazine, Imdur 2. INR 1.04- on Coumadin at 5 mg daily 3. GI- constipation resolved, no further nausea or vomiting 4. Dispo- patient is stable, wants to go home today, however has only had 1 dose of Coumadin, will discuss discharge with staff   LOS: 4 days    Lowella Dandy 11/21/2013

## 2013-11-21 NOTE — Discharge Instructions (Signed)
**  PLEASE REMEMBER TO BRING ALL OF YOUR MEDICATIONS TO EACH OF YOUR FOLLOW-UP OFFICE VISITS.  NO HEAVY LIFTING X 4 WEEKS. NO SEXUAL ACTIVITY X 4 WEEKS. NO DRIVING X 2 WEEKS. NO SOAKING BATHS, HOT TUBS, POOLS, ETC., X 7 DAYS.   Walk daily. Continue breathing exercises. Call if you develop fever >101, redness or swelling of the groin, chest pain or shortness of breath. Resume regular diet.   Vitamin K and Warfarin Warfarin is a drug that helps thin your blood. If you take warfarin, you will need to follow a diet that has a consistent amount of vitamin K-containing foods. Sudden changes in the amount of vitamin K that you eat can cause the medicine to not work as well as it should. You do not need to avoid vitamin K-containing foods. FOODS HIGH IN VITAMIN K  Broccoli, fresh or frozen, cooked, 1 cup  Greens, fresh or frozen, cooked (beet, collard, mustard, turnip),  cup  Kale, fresh or frozen, cooked,  cup  Parsley, raw, 10 sprigs  Spinach, frozen or canned, cooked,  cup FOODS MODERATELY HIGH IN VITAMIN K  Bok choy, cooked, 1 cup  Broccoli, raw, 1 cup  Brussels sprouts, fresh or frozen, cooked,  cup  Cabbage, cooked, 1 cup  Endive, raw, 1 cup  Green leaf lettuce, raw, 1 cup  Green scallions, raw,  cup  Okra, frozen, cooked, 1 cup  Romaine lettuce, raw, 1 cup  Sauerkraut, canned, 1 cup  Spinach, raw, 1 cup KEEPING YOUR INTAKE CONSISTENT  Note the foods that are high in vitamin K listed above. How many times per week do you eat these foods?  For example, you might eat cooked broccoli 1 time a week and a leafy green salad 3 times a week. In that case, you should have 1 high vitamin K food each week and 3 moderately high foods each week.  Remember, you do not need to eat the same foods each week. You do need to keep your vitamin K intake levels the same. Notify your caregiver before changing your diet. MORE TIPS  Take your warfarin as instructed.  It is okay to  take a multivitamin that contains vitamin K. Just be sure to take it every day.  Discuss any supplements or whole food supplements with your pharmacist. Document Released: 04/15/2009 Document Revised: 12/18/2011 Document Reviewed: 04/15/2009 [employer] Patient Information 2014 [place], [place].

## 2013-11-24 ENCOUNTER — Other Ambulatory Visit: Payer: Self-pay | Admitting: *Deleted

## 2013-11-24 ENCOUNTER — Telehealth: Payer: Self-pay | Admitting: Cardiology

## 2013-11-24 DIAGNOSIS — N289 Disorder of kidney and ureter, unspecified: Secondary | ICD-10-CM

## 2013-11-24 DIAGNOSIS — I359 Nonrheumatic aortic valve disorder, unspecified: Secondary | ICD-10-CM

## 2013-11-24 DIAGNOSIS — Z79899 Other long term (current) drug therapy: Secondary | ICD-10-CM

## 2013-11-24 DIAGNOSIS — I4891 Unspecified atrial fibrillation: Secondary | ICD-10-CM

## 2013-11-24 NOTE — Progress Notes (Signed)
Per patient's daughter request to have labs done tomorrow at [facility]. Lab orders faxed to [facility] and printed off to be faxed to daughter.

## 2013-11-24 NOTE — Telephone Encounter (Signed)
[name] underwent TAVR this week. He developed afib and will be new to coumadin for you guys.  He also needs a bmet Tuesday 5/27 Ideally, he should f/u with [facility] w/in 7 days for a TCM appt.    He is coming in for CCR with Misty Stanley at 2pm today

## 2013-11-24 NOTE — Telephone Encounter (Signed)
noted 

## 2013-11-25 ENCOUNTER — Ambulatory Visit (INDEPENDENT_AMBULATORY_CARE_PROVIDER_SITE_OTHER): Payer: Medicare PPO | Admitting: *Deleted

## 2013-11-25 DIAGNOSIS — Z954 Presence of other heart-valve replacement: Secondary | ICD-10-CM

## 2013-11-25 DIAGNOSIS — I4891 Unspecified atrial fibrillation: Secondary | ICD-10-CM

## 2013-11-25 DIAGNOSIS — Z952 Presence of prosthetic heart valve: Secondary | ICD-10-CM

## 2013-11-25 DIAGNOSIS — Z5181 Encounter for therapeutic drug level monitoring: Secondary | ICD-10-CM

## 2013-11-25 LAB — POCT INR: INR: 1.6

## 2013-11-25 LAB — PROTIME-INR

## 2013-11-26 ENCOUNTER — Telehealth: Payer: Self-pay | Admitting: Cardiovascular Disease

## 2013-11-26 NOTE — Telephone Encounter (Signed)
New problem ° ° °Pt returning your call. °

## 2013-11-26 NOTE — Telephone Encounter (Signed)
Note from Dr Excell Seltzer to Dr Diona Browner:  [name] - he is a little over a week out from TAVR. Creatinine up to 2.6 mg/dL (was 2.0 at discharge). I suspect mild contrast nephrophathy. Reviewed his meds and he's not on any diuretics or nephrotoxins. We will call him and ask him to push fluids. You see him 6/1. Just wanted to make you aware. I think his creatinine was in this same range prior to all of his workup, when he was on an ACE.   thx Kathlene November    I spoke with the pt's wife and made her aware that the pt needs to increase his fluid intake. I advised her that he should stay away from coffee, tea and soft drinks and increase water intake. Wife verbalized understanding of plan.

## 2013-11-30 ENCOUNTER — Ambulatory Visit (INDEPENDENT_AMBULATORY_CARE_PROVIDER_SITE_OTHER): Payer: Medicare PPO | Admitting: Cardiology

## 2013-11-30 ENCOUNTER — Encounter: Payer: Self-pay | Admitting: Cardiology

## 2013-11-30 ENCOUNTER — Ambulatory Visit (INDEPENDENT_AMBULATORY_CARE_PROVIDER_SITE_OTHER): Payer: Medicare PPO | Admitting: *Deleted

## 2013-11-30 VITALS — BP 170/72 | HR 80 | Ht 70.0 in | Wt 187.4 lb

## 2013-11-30 DIAGNOSIS — Z954 Presence of other heart-valve replacement: Secondary | ICD-10-CM

## 2013-11-30 DIAGNOSIS — N189 Chronic kidney disease, unspecified: Secondary | ICD-10-CM

## 2013-11-30 DIAGNOSIS — Z952 Presence of prosthetic heart valve: Secondary | ICD-10-CM

## 2013-11-30 DIAGNOSIS — I4891 Unspecified atrial fibrillation: Secondary | ICD-10-CM

## 2013-11-30 DIAGNOSIS — I2510 Atherosclerotic heart disease of native coronary artery without angina pectoris: Secondary | ICD-10-CM

## 2013-11-30 DIAGNOSIS — I359 Nonrheumatic aortic valve disorder, unspecified: Secondary | ICD-10-CM

## 2013-11-30 DIAGNOSIS — Z5181 Encounter for therapeutic drug level monitoring: Secondary | ICD-10-CM

## 2013-11-30 LAB — POCT INR: INR: 2.2

## 2013-11-30 MED ORDER — AMIODARONE HCL 200 MG PO TABS: 200.0000 mg | ORAL_TABLET | Freq: Every day | ORAL | Status: DC

## 2013-11-30 MED ORDER — HYDRALAZINE HCL 50 MG PO TABS: 50.0000 mg | ORAL_TABLET | Freq: Three times a day (TID) | ORAL | Status: DC

## 2013-11-30 NOTE — Progress Notes (Signed)
Clinical Summary [name] last seen in March of this year with evidence of progressive aortic stenosis in the severe range as documented by recent echocardiogram. He was referred to Dr. Excell Hayes for further evaluation, underwent cardiac catheterization demonstrating patent LIMA to LAD, patent SVG to the diagonal, patent SVG to distal RCA, and occluded SVG to OM with patent circumflex, hemodynamics consistent with severe aortic stenosis. He was evaluated by TCTS and it was felt that the patient would be better served by TAVR than redo open AVR.  Patient underwent TAVR by Dr. Excell Hayes. Post procedure course was complicated by recurrent atrial fibrillation. Renal insufficiency was overall stable, creatinine 1.8-2.0. He will be following up with Dr. Laneta Hayes and Dr. Excell Hayes in mid June with repeat chest x-ray and echocardiogram. He has been on loading dose amiodarone since discharge on May 22.  Most recent lab work from May 27 showed BUN 37, creatinine 2.6, potassium 4.8. He was asked to increase fluids by Dr. Excell Hayes, component of contrast nephropathy suspected. Prior to his cardiac catheterization he was taken off lisinopril 40 mg daily. His blood pressure is uncontrolled.  Patient is here with his granddaughter today. He states he has had trouble feeling nauseated, question whether this might be related to his amiodarone loading dose. Appetite has been somewhat limited as well. He reports no chest pain, no palpitations. Still has resolving ecchymoses from his recent hospitalization. He stated he has been doing some walking on level ground at home. To me he seemed a little less interactive than when I last spoke with him.  ECG today confirms sinus bradycardia with normal intervals.   Allergies  Allergen Reactions  . Lipitor [Atorvastatin Calcium]     Leg pain    Current Outpatient Prescriptions  Medication Sig Dispense Refill  . acetaminophen (TYLENOL) 325 MG tablet Take 650 mg  by mouth every 6 (six) hours as needed for mild pain.       Marland Kitchen. aspirin 81 MG tablet Take 81 mg by mouth daily.        . famotidine (PEPCID) 20 MG tablet Take 20 mg by mouth 2 (two) times daily as needed for heartburn.       . isosorbide mononitrate (IMDUR) 30 MG 24 hr tablet Take 15 mg by mouth daily.      . metoprolol tartrate (LOPRESSOR) 25 MG tablet Take 1 tablet (25 mg total) by mouth 2 (two) times daily.  60 tablet  3  . nitroGLYCERIN (NITROSTAT) 0.4 MG SL tablet Place 1 tablet (0.4 mg total) under the tongue every 5 (five) minutes as needed.  25 tablet  3  . Omega-3 Fatty Acids (FISH OIL) 1200 MG CAPS Take 1 capsule by mouth 3 (three) times daily.        Marland Kitchen. oxyCODONE (OXY IR/ROXICODONE) 5 MG immediate release tablet Take 1-2 tablets (5-10 mg total) by mouth every 4 (four) hours as needed for severe pain.  30 tablet  0  . simvastatin (ZOCOR) 40 MG tablet Take 1 tablet (40 mg total) by mouth every evening.  30 tablet  6  . warfarin (COUMADIN) 2.5 MG tablet Take 1 tablet (2.5 mg total) by mouth daily.  30 tablet  3  . amiodarone (PACERONE) 200 MG tablet Take 1 tablet (200 mg total) by mouth daily.  90 tablet  3  . hydrALAZINE (APRESOLINE) 50 MG tablet Take 1 tablet (50 mg total) by mouth 3 (three) times daily.  270 tablet  3  No current facility-administered medications for this visit.    Past Medical History  Diagnosis Date  . Coronary atherosclerosis of native coronary artery     Multivessel, LVEF 65%  . Mixed hyperlipidemia   . Essential hypertension, benign   . Myocardial infarction   . CKD (chronic kidney disease) stage 3, GFR 30-59 ml/min   . Arthritis   . Aortic stenosis   . 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 Umbilical hernia repair    . Appendectomy    . Left total knee arthroplasty  04  . Cataract  extraction Left 04/27/13    Dr. Lita Hayes  . Eye surgery    . Coronary artery bypass graft      LIMA to LAD, SVG to OM, SVG to RCA, SVG to diagonal 11/04  . Transcatheter aortic valve replacement, transfemoral N/A 11/17/2013    Procedure: TRANSCATHETER AORTIC VALVE REPLACEMENT, TRANSFEMORAL;  Surgeon: Craig Bollman, MD;  Location: [facility] OR;  Service: Open Heart Surgery;  Laterality: N/A;  . Intraoperative transesophageal echocardiogram N/A 11/17/2013    Procedure: INTRAOPERATIVE TRANSESOPHAGEAL ECHOCARDIOGRAM;  Surgeon: Craig Bollman, MD;  Location: [facility] OR;  Service: Open Heart Surgery;  Laterality: N/A;    Social History [name] that he has never smoked. He has never used smokeless tobacco. [name] that he does not drink alcohol.  Review of Systems Outlined above, otherwise negative.  Physical Examination Filed Vitals:   11/30/13 1010  BP: 170/72  Pulse: 80   Filed Weights   11/30/13 0949  Weight: 187 lb 6.4 oz (85.004 kg)    Overweight male in no acute distress, hard of hearing.  HEENT: Conjunctiva and lids normal, oropharynx clear.  Neck: Supple, no elevated jugular venous pressure. No thyromegaly.  Lungs: Clear without labored breathing.  Cardiac: Regular rate and rhythm with 2/6 systolic murmur at the base. No S3 gallop.  Abdomen: Soft, nontender, no bruits. Bowel sounds present.  Skin: Warm and dry. Resolving ecchymoses, large area on the right forearm. Nontender. Extremities: No pitting edema. Distal pulses 2+.  Musculoskeletal: No kyphosis.  Neuropsychiatric: Alert oriented x3. Affect normal.   Problem List and Plan   S/P TAVR (transcatheter aortic valve replacement) Recent procedure, records reviewed. Patient has followup with Dr. Excell Seltzer and Dr. Laneta [name] within the next few weeks. Will have a chest x-ray an echocardiogram at that time.  Atrial fibrillation Now back in sinus rhythm, confirmed by ECG. Amiodarone will be cut to 200 mg once a day -  we may even need to discontinue altogether if nausea is related to does not improve. Check INR today and establish in Coumadin clinic.  Essential hypertension, benign Blood pressure not well controlled, has been trending up since hospitalization. He was taken off lisinopril 40 mg daily prior to angiography. Recent creatinine 2.6. Increase hydralazine to 50 mg every 8 hours for now. I recall he had trouble tolerating Norvasc in the past due to leg edema.  RENAL INSUFFICIENCY, CHRONIC Typically stage III, creatinine up to 2.6 by last check. Repeat BMET in a week. He is not on any nephrotoxic agents at this time.  CORONARY ATHEROSCLEROSIS NATIVE CORONARY ARTERY Multivessel disease, anatomy documented above by recent cardiac catheterization. No active angina. He is on long-acting nitrate.    Jonelle Sidle, M.D., F.A.C.C.

## 2013-11-30 NOTE — Assessment & Plan Note (Signed)
Multivessel disease, anatomy documented above by recent cardiac catheterization. No active angina. He is on long-acting nitrate.

## 2013-11-30 NOTE — Assessment & Plan Note (Signed)
Typically stage III, creatinine up to 2.6 by last check. Repeat BMET in a week. He is not on any nephrotoxic agents at this time.

## 2013-11-30 NOTE — Assessment & Plan Note (Signed)
Now back in sinus rhythm, confirmed by ECG. Amiodarone will be cut to 200 mg once a day - we may even need to discontinue altogether if nausea is related to does not improve. Check INR today and establish in Coumadin clinic.

## 2013-11-30 NOTE — Patient Instructions (Signed)
Your physician recommends that you schedule a follow-up appointment in: 6 weeks. Your physician has recommended you make the following change in your medication:  Decrease your amiodarone to 200 mg daily. You may break your 400 mg tablet in half daily until they are finished. Increase your hydralazine to 50 mg every 8 hours. You may take (2) of your 25 mg tablets every 8 hours until they are finished. Your new prescriptions were sent to your pharmacy. Continue all other medications the same. Your physician recommends that you have lab work in 1 week around December 07, 2013 to check your BMET.

## 2013-11-30 NOTE — Assessment & Plan Note (Signed)
Blood pressure not well controlled, has been trending up since hospitalization. He was taken off lisinopril 40 mg daily prior to angiography. Recent creatinine 2.6. Increase hydralazine to 50 mg every 8 hours for now. I recall he had trouble tolerating Norvasc in the past due to leg edema.

## 2013-11-30 NOTE — Assessment & Plan Note (Signed)
Recent procedure, records reviewed. Patient has followup with Dr. Excell Seltzer and Dr. Laneta Simmers within the next few weeks. Will have a chest x-ray an echocardiogram at that time.

## 2013-12-08 ENCOUNTER — Other Ambulatory Visit: Payer: Self-pay | Admitting: Surgery

## 2013-12-08 ENCOUNTER — Ambulatory Visit (INDEPENDENT_AMBULATORY_CARE_PROVIDER_SITE_OTHER): Payer: Medicare PPO | Admitting: *Deleted

## 2013-12-08 ENCOUNTER — Encounter: Payer: Self-pay | Admitting: *Deleted

## 2013-12-08 DIAGNOSIS — Z954 Presence of other heart-valve replacement: Secondary | ICD-10-CM

## 2013-12-08 DIAGNOSIS — Z5181 Encounter for therapeutic drug level monitoring: Secondary | ICD-10-CM

## 2013-12-08 DIAGNOSIS — I4891 Unspecified atrial fibrillation: Secondary | ICD-10-CM

## 2013-12-08 DIAGNOSIS — Z952 Presence of prosthetic heart valve: Secondary | ICD-10-CM

## 2013-12-08 LAB — POCT INR: INR: 2.5

## 2013-12-08 NOTE — Progress Notes (Signed)
Agree, would stop Zocor for now to see if his symptoms improve.

## 2013-12-08 NOTE — Progress Notes (Signed)
While patient in office for INR today, he informed the nurse that simvastatin was making him sick every night after taking it. Patient said he's very nauseated for about 4 hours after taking simvastatin. Patient informed nurse that he was going to come off it to see if his symptoms improve.

## 2013-12-09 ENCOUNTER — Ambulatory Visit (INDEPENDENT_AMBULATORY_CARE_PROVIDER_SITE_OTHER): Payer: Medicare PPO | Admitting: Surgery

## 2013-12-09 ENCOUNTER — Encounter: Payer: Self-pay | Admitting: Surgery

## 2013-12-09 ENCOUNTER — Ambulatory Visit
Admission: RE | Admit: 2013-12-09 | Discharge: 2013-12-09 | Disposition: A | Discharge status: Home / Self Care | Payer: Medicare PPO | Source: Ambulatory Visit | Attending: Surgery | Admitting: Surgery

## 2013-12-09 VITALS — BP 159/62 | HR 53 | Resp 16 | Ht 70.0 in | Wt 185.0 lb

## 2013-12-09 DIAGNOSIS — Z952 Presence of prosthetic heart valve: Secondary | ICD-10-CM

## 2013-12-09 DIAGNOSIS — I4891 Unspecified atrial fibrillation: Secondary | ICD-10-CM

## 2013-12-09 DIAGNOSIS — I359 Nonrheumatic aortic valve disorder, unspecified: Secondary | ICD-10-CM

## 2013-12-09 DIAGNOSIS — Z954 Presence of other heart-valve replacement: Secondary | ICD-10-CM

## 2013-12-09 DIAGNOSIS — I350 Nonrheumatic aortic (valve) stenosis: Secondary | ICD-10-CM

## 2013-12-09 NOTE — Progress Notes (Signed)
HPI:  Patient returns for routine postoperative follow-up having undergone TAVR on 11/17/2013. The patient's early postoperative recovery while in the hospital was notable for an uncomplicated postop course. Since hospital discharge the patient reports that he had a lot of nausea and some vomiting intially. His amiodarone was decreased to 200 mg daily and his symptoms improved. His Zocor was stopped yesterday and that has resolved his nausea. His daughter is with him today and she has noticed some emotional lability since surgery that is improving. He is walking without shortness of breath.   Current Outpatient Prescriptions  Medication Sig Dispense Refill  . acetaminophen (TYLENOL) 325 MG tablet Take 650 mg by mouth every 6 (six) hours as needed for mild pain.       Marland Kitchen. amiodarone (PACERONE) 200 MG tablet Take 1 tablet (200 mg total) by mouth daily.  90 tablet  3  . aspirin 81 MG tablet Take 81 mg by mouth daily.        . famotidine (PEPCID) 20 MG tablet Take 20 mg by mouth 2 (two) times daily as needed for heartburn.       . hydrALAZINE (APRESOLINE) 50 MG tablet Take 1 tablet (50 mg total) by mouth 3 (three) times daily.  270 tablet  3  . isosorbide mononitrate (IMDUR) 30 MG 24 hr tablet Take 15 mg by mouth daily.      . metoprolol tartrate (LOPRESSOR) 25 MG tablet Take 1 tablet (25 mg total) by mouth 2 (two) times daily.  60 tablet  3  . nitroGLYCERIN (NITROSTAT) 0.4 MG SL tablet Place 1 tablet (0.4 mg total) under the tongue every 5 (five) minutes as needed.  25 tablet  3  . Omega-3 Fatty Acids (FISH OIL) 1200 MG CAPS Take 1 capsule by mouth daily.       . simvastatin (ZOCOR) 40 MG tablet Take 1 tablet (40 mg total) by mouth every evening.  30 tablet  6  . warfarin (COUMADIN) 2.5 MG tablet Take 1 tablet (2.5 mg total) by mouth daily.  30 tablet  3   No current facility-administered medications for this visit.    Physical Exam: BP 159/62  Pulse 53  Resp 16  Ht 5\' 10"  (1.778 m)   Wt 185 lb (83.915 kg)  BMI 26.54 kg/m2  SpO2 96% He looks well Lungs are clear Cardiac exam shows a regular rate and rhythm with normal heart sounds. The left groin incision is healing well and there is no significant peripheral edema.   Diagnostic Tests:   CLINICAL DATA: ATRIAL FIBRILLATION  EXAM:  CHEST 2 VIEW  COMPARISON: 11/18/2013.  FINDINGS:  The patient's Swan-Ganz catheter and internal jugular sheath have  been removed. The patient is status post median sternotomy changes  are stable. Minimal areas of linear density within the lung bases.  Minimal blunting of the costophrenic angles. No focal regions of  consolidation or focal infiltrates. Atherosclerotic changes within  the shoulders. No acute osseous abnormalities. The cardiac  silhouette is mild-to-moderately enlarged.  IMPRESSION:  Minimal atelectasis within the lung bases  Trace effusions within the costophrenic angle regions  Otherwise stable chest radiograph  Electronically Signed  By: Salome HolmesHector Cooper M.D.  On: 12/09/2013 09:25         Impression:  Overall I think he is doing well. I encouraged him to continue walking. I told him he could drive his car but should not lift anything heavier than 10 lbs for two months postop.   Plan:  He will return Valve Clinic on 6/19 to see Dr. Excell Seltzer and myself for routine TAVR follow up.

## 2013-12-15 ENCOUNTER — Ambulatory Visit (INDEPENDENT_AMBULATORY_CARE_PROVIDER_SITE_OTHER): Payer: Medicare PPO | Admitting: *Deleted

## 2013-12-15 DIAGNOSIS — Z952 Presence of prosthetic heart valve: Secondary | ICD-10-CM

## 2013-12-15 DIAGNOSIS — Z954 Presence of other heart-valve replacement: Secondary | ICD-10-CM

## 2013-12-15 DIAGNOSIS — I4891 Unspecified atrial fibrillation: Secondary | ICD-10-CM

## 2013-12-15 DIAGNOSIS — Z5181 Encounter for therapeutic drug level monitoring: Secondary | ICD-10-CM

## 2013-12-15 LAB — POCT INR: INR: 2.1

## 2013-12-18 ENCOUNTER — Ambulatory Visit (HOSPITAL_COMMUNITY)
Admission: RE | Admit: 2013-12-18 | Discharge: 2013-12-18 | Disposition: A | Discharge status: Home / Self Care | Payer: Medicare PPO | Source: Ambulatory Visit | Attending: Cardiovascular Disease | Admitting: Cardiovascular Disease

## 2013-12-18 ENCOUNTER — Encounter (HOSPITAL_COMMUNITY): Payer: Self-pay | Admitting: Cardiovascular Disease

## 2013-12-18 ENCOUNTER — Encounter (HOSPITAL_COMMUNITY): Payer: Self-pay | Admitting: Thoracic Surgery (Cardiothoracic Vascular Surgery)

## 2013-12-18 ENCOUNTER — Encounter (HOSPITAL_COMMUNITY): Payer: Self-pay | Admitting: Surgery

## 2013-12-18 ENCOUNTER — Ambulatory Visit (HOSPITAL_COMMUNITY)
Admission: RE | Admit: 2013-12-18 | Discharge: 2013-12-18 | Disposition: A | Discharge status: Home / Self Care | Payer: Medicare PPO | Source: Ambulatory Visit | Attending: Surgery | Admitting: Surgery

## 2013-12-18 VITALS — BP 128/62 | HR 51 | Resp 19 | Ht 70.0 in | Wt 181.8 lb

## 2013-12-18 DIAGNOSIS — I359 Nonrheumatic aortic valve disorder, unspecified: Secondary | ICD-10-CM

## 2013-12-18 DIAGNOSIS — N183 Chronic kidney disease, stage 3 unspecified: Secondary | ICD-10-CM | POA: Insufficient documentation

## 2013-12-18 DIAGNOSIS — I129 Hypertensive chronic kidney disease with stage 1 through stage 4 chronic kidney disease, or unspecified chronic kidney disease: Secondary | ICD-10-CM | POA: Insufficient documentation

## 2013-12-18 DIAGNOSIS — Z09 Encounter for follow-up examination after completed treatment for conditions other than malignant neoplasm: Secondary | ICD-10-CM | POA: Insufficient documentation

## 2013-12-18 DIAGNOSIS — I059 Rheumatic mitral valve disease, unspecified: Secondary | ICD-10-CM

## 2013-12-18 DIAGNOSIS — I2510 Atherosclerotic heart disease of native coronary artery without angina pectoris: Secondary | ICD-10-CM | POA: Insufficient documentation

## 2013-12-18 DIAGNOSIS — Z954 Presence of other heart-valve replacement: Secondary | ICD-10-CM | POA: Insufficient documentation

## 2013-12-18 NOTE — Progress Notes (Signed)
MULTIDISCIPLINARY HEART VALVE CLINIC NOTE  Patient ID: [name] MRN: [id number] DOB/AGE: 1928-08-26 80 y.o.  Primary Care Physician:HASANAJ,XAJE A, MD Primary Cardiologist: Dr Diona BrownerMcDowell  HPI: [name] returns for follow-up now one month out from transfemoral TAVR for treatment of severe symptomatic aortic stenosis. He has been doing well, with improvement in his breathing. He denies chest pain. He was noted to have post-op afib and was started on amiodarone. He had problems with nausea, but this has resolved since stopping simvastatin. Also had some increase in creatinine - advised to push fluids. Overall he is doing well without leg swelling, orthopnea, or PND.  Past Medical History  Diagnosis Date  . Coronary atherosclerosis of native coronary artery     Multivessel, LVEF 65%  . Mixed hyperlipidemia   . Essential hypertension, benign   . Myocardial infarction   . CKD (chronic kidney disease) stage 3, GFR 30-59 ml/min   . Arthritis   . Aortic stenosis   . 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 Umbilical hernia repair    . Appendectomy    . Left total knee arthroplasty  04  . Cataract extraction Left 04/27/13    Dr. Lita MainsHaines  . Eye surgery    . Coronary artery bypass graft      LIMA to LAD, SVG to OM, SVG to RCA, SVG to diagonal 11/04  . Transcatheter aortic valve replacement, transfemoral N/A 11/17/2013    Procedure: TRANSCATHETER AORTIC VALVE REPLACEMENT, TRANSFEMORAL;  Surgeon: Tonny BollmanMichael Brielynn Sekula, MD;  [facility];  Service: Open Heart Surgery;  Laterality: N/A;  . Intraoperative transesophageal echocardiogram N/A 11/17/2013    Procedure: INTRAOPERATIVE TRANSESOPHAGEAL ECHOCARDIOGRAM;  Surgeon: Tonny BollmanMichael Caeley Dohrmann, MD;  [facility];  Service: Open Heart Surgery;  Laterality: N/A;    No family history on  file.  History   Social History  . Marital Status: Married    Spouse Name: N/A    Number of Children: N/A  . Years of Education: N/A   Occupational History  . Not on file.   Social History Main Topics  . Smoking status: Never Smoker   . Smokeless tobacco: Never Used     Comment: tobacco use - no  . Alcohol Use: No  . Drug Use: No  . Sexual Activity: Not on file   Other Topics Concern  . Not on file   Social History Narrative   Married.      Prior to Admission medications   Medication Sig Start Date End Date Taking? Authorizing Provider  acetaminophen (TYLENOL) 325 MG tablet Take 650 mg by mouth every 6 (six) hours as needed for mild pain.    Yes Historical Provider, MD  amiodarone (PACERONE) 200 MG tablet Take 1 tablet (200 mg total) by mouth daily. 11/30/13  Yes Jonelle SidleSamuel G McDowell, MD  aspirin 81 MG tablet Take 81 mg by mouth daily.     Yes Historical Provider, MD  famotidine (PEPCID) 20 MG tablet Take 20 mg by mouth 2 (two) times daily as needed for heartburn.    Yes Historical Provider, MD  hydrALAZINE (APRESOLINE) 50 MG tablet Take 1 tablet (50 mg total) by mouth 3 (three) times daily. 11/30/13  Yes Jonelle SidleSamuel G McDowell, MD  isosorbide mononitrate (IMDUR) 30 MG 24 hr tablet Take 15 mg by mouth daily.   Yes Historical Provider,  MD  metoprolol tartrate (LOPRESSOR) 25 MG tablet Take 1 tablet (25 mg total) by mouth 2 (two) times daily. 11/21/13  Yes Erin Barrett, PA-C  nitroGLYCERIN (NITROSTAT) 0.4 MG SL tablet Place 1 tablet (0.4 mg total) under the tongue every 5 (five) minutes as needed. 04/28/12  Yes Jonelle SidleSamuel G McDowell, MD  Omega-3 Fatty Acids (FISH OIL) 1200 MG CAPS Take 1 capsule by mouth daily.    Yes Historical Provider, MD  warfarin (COUMADIN) 2.5 MG tablet Take 1 tablet (2.5 mg total) by mouth daily. 11/21/13  Yes Erin Barrett, PA-C    Allergies  Allergen Reactions  . Lipitor [Atorvastatin Calcium]     Leg pain     BP 128/62  Pulse 51  Resp 19  Ht 5\' 10"  (1.778 m)   Wt 181 lb 12.8 oz (82.464 kg)  BMI 26.09 kg/m2  SpO2 98%  PHYSICAL EXAM: Pt is alert and oriented, WD, WN, in no distress. HEENT: normal Neck: JVP normal. Carotid upstrokes normal Lungs: equal expansion, clear bilaterally CV: Apex is discrete and nondisplaced, RRR with a soft diastolic decrescendo murmur heard best at the left lower sternal border Abd: soft, NT, +BS, no bruit, no hepatosplenomegaly Back: no CVA tenderness Ext: no C/C/E        DP/PT pulses intact and = Skin: warm and dry without rash Neuro: CNII-XII intact             Strength intact = bilaterally  ASSESSMENT AND PLAN:  1. Severe AS s/p TAVR (30 day visit) 2. Chronic diastolic heart failure, now NYHA Function Class I 3. Post-op atrial fib, in sinus rhythm 4. CKD, Stage 3  The patient is doing well. I have reviewed his echo which shows mild perivalvular AI, normal transaortic valve gradients, and normal LV systolic function. He will continue to follow with Dr Diona BrownerMcDowell and we will plan on seeing him back for his one year follow-up visit. Advised issues related to amiodarone and warfarin would be addressed when he sees Dr Diona BrownerMcDowell next month, but I suspect amio will be discontinued at that time.  Tonny BollmanMichael Avin Gibbons, MD 12/19/2013 6:32 AM

## 2013-12-19 ENCOUNTER — Encounter (HOSPITAL_COMMUNITY): Payer: Self-pay | Admitting: Cardiovascular Disease

## 2013-12-29 ENCOUNTER — Ambulatory Visit (INDEPENDENT_AMBULATORY_CARE_PROVIDER_SITE_OTHER): Payer: Medicare PPO | Admitting: *Deleted

## 2013-12-29 DIAGNOSIS — Z5181 Encounter for therapeutic drug level monitoring: Secondary | ICD-10-CM

## 2013-12-29 DIAGNOSIS — I4891 Unspecified atrial fibrillation: Secondary | ICD-10-CM

## 2013-12-29 DIAGNOSIS — Z954 Presence of other heart-valve replacement: Secondary | ICD-10-CM

## 2013-12-29 DIAGNOSIS — Z952 Presence of prosthetic heart valve: Secondary | ICD-10-CM

## 2013-12-29 LAB — POCT INR: INR: 1.8

## 2014-01-12 ENCOUNTER — Ambulatory Visit (INDEPENDENT_AMBULATORY_CARE_PROVIDER_SITE_OTHER): Payer: Medicare PPO | Admitting: *Deleted

## 2014-01-12 DIAGNOSIS — Z5181 Encounter for therapeutic drug level monitoring: Secondary | ICD-10-CM

## 2014-01-12 DIAGNOSIS — Z952 Presence of prosthetic heart valve: Secondary | ICD-10-CM

## 2014-01-12 DIAGNOSIS — Z954 Presence of other heart-valve replacement: Secondary | ICD-10-CM

## 2014-01-12 DIAGNOSIS — I4891 Unspecified atrial fibrillation: Secondary | ICD-10-CM

## 2014-01-12 LAB — POCT INR: INR: 1.9

## 2014-01-12 MED ORDER — WARFARIN SODIUM 2.5 MG PO TABS: ORAL_TABLET | ORAL | Status: DC

## 2014-01-14 ENCOUNTER — Encounter: Payer: Self-pay | Admitting: Cardiology

## 2014-01-14 ENCOUNTER — Ambulatory Visit (INDEPENDENT_AMBULATORY_CARE_PROVIDER_SITE_OTHER): Payer: Medicare PPO | Admitting: Cardiology

## 2014-01-14 VITALS — BP 185/53 | HR 49 | Ht 70.0 in | Wt 181.1 lb

## 2014-01-14 DIAGNOSIS — N189 Chronic kidney disease, unspecified: Secondary | ICD-10-CM

## 2014-01-14 DIAGNOSIS — R609 Edema, unspecified: Secondary | ICD-10-CM

## 2014-01-14 DIAGNOSIS — I10 Essential (primary) hypertension: Secondary | ICD-10-CM

## 2014-01-14 DIAGNOSIS — I480 Paroxysmal atrial fibrillation: Secondary | ICD-10-CM

## 2014-01-14 DIAGNOSIS — R600 Localized edema: Secondary | ICD-10-CM

## 2014-01-14 DIAGNOSIS — Z952 Presence of prosthetic heart valve: Secondary | ICD-10-CM

## 2014-01-14 DIAGNOSIS — I4891 Unspecified atrial fibrillation: Secondary | ICD-10-CM

## 2014-01-14 DIAGNOSIS — Z954 Presence of other heart-valve replacement: Secondary | ICD-10-CM

## 2014-01-14 MED ORDER — FUROSEMIDE 20 MG PO TABS: 20.0000 mg | ORAL_TABLET | Freq: Every day | ORAL | Status: DC | PRN

## 2014-01-14 MED ORDER — AMOXICILLIN 500 MG PO CAPS: 2000.0000 mg | ORAL_CAPSULE | ORAL | Status: DC

## 2014-01-14 MED ORDER — HYDRALAZINE HCL 50 MG PO TABS: 75.0000 mg | ORAL_TABLET | Freq: Three times a day (TID) | ORAL | Status: DC

## 2014-01-14 NOTE — Assessment & Plan Note (Signed)
Noted in the periprocedural setting. This has not been recurrent so far. We will stop the amiodarone at this point.

## 2014-01-14 NOTE — Patient Instructions (Signed)
Your physician recommends that you schedule a follow-up appointment in: 6 weeks. Your physician has recommended you make the following change in your medication:  Stop amiodarone. Increase hydralazine to 75 mg three times daily. Please take 1&1/2 of your 50 mg tablets to equal this dose. Start amoxicillin 500 mg as directed. Take 4 capsules by mouth 30 minutes-1 hour prior to dental procedures. Start furosemide 20 mg daily as needed for leg edema. Your physician recommends that you have lab work to check your BMET.

## 2014-01-14 NOTE — Assessment & Plan Note (Signed)
Symmetrical, nontender. No erythema to suggest cellulitis. Recent echocardiogram results reviewed. LVEF is normal with normal aortic prosthetic gradient. He had trace perivalvular regurgitation. I will give a prescription for Lasix 20 mg to be used intermittently, would prefer to hold off standing diuretic at least until we see what his creatinine does over time.

## 2014-01-14 NOTE — Assessment & Plan Note (Signed)
Increase hydralazine to 75 mg 3 times a day.

## 2014-01-14 NOTE — Assessment & Plan Note (Signed)
Followup BMET, last creatinine 2.4. Not resuming ACE inhibitor at this time.

## 2014-01-14 NOTE — Assessment & Plan Note (Signed)
Patient is doing reasonably well, has already had followup with Dr. Excell Seltzerooper. Recent echocardiogram results noted above. Medications are being adjusted as noted. SBE prophylaxis to be given prior to dental work pending in the near future. Followup in 6 weeks.

## 2014-01-14 NOTE — Progress Notes (Signed)
Clinical Summary [name] last seen in June. At that time dose of amiodarone was reduced. He had followup with Dr. Laneta Hayes and Dr. Excell Seltzerooper in the interim. He is here with his daughter today. She tells me that he has been somewhat emotionally labile, but in general seems to be doing well. Within the last few days he has had some leg and ankle edema. No increasing shortness of breath however. No chest pain, fevers or chills, palpitations. His weight is stable.  Followup echocardiogram in June demonstrated mild LVH with LVEF 60-65%, aortic prosthesis with mean gradient 10 mm mercury and trace perivalvular regurgitation, mild mitral regurgitation.  Lab work from June showed BUN 46, creatinine 2.4, potassium 4.8. He has not yet had followup lab work.  Today we reviewed his medications, we have decided to stop amiodarone at this point. He will be having dental cleaning soon, and will be prescribed amoxicillin to take beforehand.   Allergies  Allergen Reactions  . Lipitor [Atorvastatin Calcium]     Leg pain    Current Outpatient Prescriptions  Medication Sig Dispense Refill  . acetaminophen (TYLENOL) 325 MG tablet Take 650 mg by mouth every 6 (six) hours as needed for mild pain.       Marland Kitchen. aspirin 81 MG tablet Take 81 mg by mouth daily.        . famotidine (PEPCID) 20 MG tablet Take 20 mg by mouth 2 (two) times daily as needed for heartburn.       . hydrALAZINE (APRESOLINE) 50 MG tablet Take 1.5 tablets (75 mg total) by mouth 3 (three) times daily.  320 tablet  3  . isosorbide mononitrate (IMDUR) 30 MG 24 hr tablet Take 15 mg by mouth daily.      . metoprolol tartrate (LOPRESSOR) 25 MG tablet Take 1 tablet (25 mg total) by mouth 2 (two) times daily.  60 tablet  3  . nitroGLYCERIN (NITROSTAT) 0.4 MG SL tablet Place 1 tablet (0.4 mg total) under the tongue every 5 (five) minutes as needed.  25 tablet  3  . Omega-3 Fatty Acids (FISH OIL) 1200 MG CAPS Take 1 capsule by mouth  daily.       Marland Kitchen. warfarin (COUMADIN) 2.5 MG tablet Take 1 1/2 tablets daily except 1 tablet on M,W,F  45 tablet  3  . amoxicillin (AMOXIL) 500 MG capsule Take 4 capsules (2,000 mg total) by mouth as directed.  12 capsule  0  . furosemide (LASIX) 20 MG tablet Take 1 tablet (20 mg total) by mouth daily as needed. For leg edema  90 tablet  0   No current facility-administered medications for this visit.    Past Medical History  Diagnosis Date  . Coronary atherosclerosis of native coronary artery     Multivessel, LVEF 65%  . Mixed hyperlipidemia   . Essential hypertension, benign   . Myocardial infarction   . CKD (chronic kidney disease) stage 3, GFR 30-59 ml/min   . Arthritis   . Aortic stenosis   . 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 Umbilical hernia repair    . Appendectomy    . Left total knee arthroplasty  04  . Cataract extraction Left 04/27/13    Dr. Lita Hayes  . Eye surgery    . Coronary artery bypass graft  LIMA to LAD, SVG to OM, SVG to RCA, SVG to diagonal 11/04  . Transcatheter aortic valve replacement, transfemoral N/A 11/17/2013    Procedure: TRANSCATHETER AORTIC VALVE REPLACEMENT, TRANSFEMORAL;  Surgeon: Craig Bollman, MD;  Location: [facility];  Service: Open Heart Surgery;  Laterality: N/A;  . Intraoperative transesophageal echocardiogram N/A 11/17/2013    Procedure: INTRAOPERATIVE TRANSESOPHAGEAL ECHOCARDIOGRAM;  Surgeon: Craig Bollman, MD;  Location: [facility] OR;  Service: Open Heart Surgery;  Laterality: N/A;    Social History [name] that he has never smoked. He has never used smokeless tobacco. [name] that he does not drink alcohol.  Review of Systems Hard of hearing. Stable appetite. No bleeding problems. Other systems reviewed and negative except as outlined  above.  Physical Examination Filed Vitals:   01/14/14 1044  BP: 185/53  Pulse: 49   Filed Weights   01/14/14 1041  Weight: 181 lb 1.9 oz (82.155 kg)    Overweight male in no acute distress, hard of hearing.  HEENT: Conjunctiva and lids normal, oropharynx clear.  Neck: Supple, no elevated jugular venous pressure. No thyromegaly.  Lungs: Clear without labored breathing.  Cardiac: Regular rate and rhythm with 2/6 systolic murmur at the base, 2/6 diastolic murmur as well. No S3 gallop.  Abdomen: Soft, nontender, no bruits. Bowel sounds present.  Skin: Warm and dry. Extremities: 1-2+ ankle and lower leg edema. Distal pulses 2+.  Musculoskeletal: No kyphosis.  Neuropsychiatric: Alert oriented x3. Affect normal.   Problem List and Plan   S/P TAVR (transcatheter aortic valve replacement) Patient is doing reasonably well, has already had followup with Dr. Excell Hayes. Recent echocardiogram results noted above. Medications are being adjusted as noted. SBE prophylaxis to be given prior to dental work pending in the near future. Followup in 6 weeks.  Bilateral leg edema Symmetrical, nontender. No erythema to suggest cellulitis. Recent echocardiogram results reviewed. LVEF is normal with normal aortic prosthetic gradient. He had trace perivalvular regurgitation. I will give a prescription for Lasix 20 mg to be used intermittently, would prefer to hold off standing diuretic at least until we see what his creatinine does over time.  Atrial fibrillation Noted in the periprocedural setting. This has not been recurrent so far. We will stop the amiodarone at this point.  RENAL INSUFFICIENCY, CHRONIC Followup BMET, last creatinine 2.4. Not resuming ACE inhibitor at this time.  Essential hypertension, benign Increase hydralazine to 75 mg 3 times a day.    [name], M.D., F.A.C.C.

## 2014-01-19 ENCOUNTER — Telehealth: Payer: Self-pay | Admitting: *Deleted

## 2014-01-19 NOTE — Telephone Encounter (Signed)
Message copied by Dominica Kent, LYEustace MooreIA M on Tue Jan 19, 2014  4:56 PM ------      Message from: Jonelle SidleMCDOWELL, SAMUEL G      Created: Fri Jan 15, 2014  8:53 AM       Reviewed. Overall stable renal dysfunction, creatinine 2.5, BUN 44, potassium 4.8. ------

## 2014-01-19 NOTE — Telephone Encounter (Signed)
Patient's wife informed

## 2014-01-26 ENCOUNTER — Ambulatory Visit (INDEPENDENT_AMBULATORY_CARE_PROVIDER_SITE_OTHER): Payer: Medicare PPO | Admitting: *Deleted

## 2014-01-26 DIAGNOSIS — Z952 Presence of prosthetic heart valve: Secondary | ICD-10-CM

## 2014-01-26 DIAGNOSIS — Z5181 Encounter for therapeutic drug level monitoring: Secondary | ICD-10-CM

## 2014-01-26 DIAGNOSIS — Z954 Presence of other heart-valve replacement: Secondary | ICD-10-CM

## 2014-01-26 DIAGNOSIS — I4891 Unspecified atrial fibrillation: Secondary | ICD-10-CM

## 2014-01-26 LAB — POCT INR: INR: 2.2

## 2014-02-09 ENCOUNTER — Ambulatory Visit (INDEPENDENT_AMBULATORY_CARE_PROVIDER_SITE_OTHER): Payer: Medicare PPO | Admitting: *Deleted

## 2014-02-09 DIAGNOSIS — I4891 Unspecified atrial fibrillation: Secondary | ICD-10-CM

## 2014-02-09 DIAGNOSIS — Z952 Presence of prosthetic heart valve: Secondary | ICD-10-CM

## 2014-02-09 DIAGNOSIS — Z954 Presence of other heart-valve replacement: Secondary | ICD-10-CM

## 2014-02-09 DIAGNOSIS — Z5181 Encounter for therapeutic drug level monitoring: Secondary | ICD-10-CM

## 2014-02-09 LAB — POCT INR: INR: 1.6

## 2014-02-09 MED ORDER — WARFARIN SODIUM 2.5 MG PO TABS: ORAL_TABLET | ORAL | Status: DC

## 2014-02-23 ENCOUNTER — Ambulatory Visit (INDEPENDENT_AMBULATORY_CARE_PROVIDER_SITE_OTHER): Payer: Medicare PPO | Admitting: *Deleted

## 2014-02-23 DIAGNOSIS — Z5181 Encounter for therapeutic drug level monitoring: Secondary | ICD-10-CM

## 2014-02-23 DIAGNOSIS — Z952 Presence of prosthetic heart valve: Secondary | ICD-10-CM

## 2014-02-23 DIAGNOSIS — I4891 Unspecified atrial fibrillation: Secondary | ICD-10-CM

## 2014-02-23 DIAGNOSIS — Z954 Presence of other heart-valve replacement: Secondary | ICD-10-CM

## 2014-02-23 LAB — POCT INR: INR: 2.4

## 2014-03-03 ENCOUNTER — Ambulatory Visit: Payer: Medicare PPO | Admitting: Cardiology

## 2014-03-09 ENCOUNTER — Ambulatory Visit (INDEPENDENT_AMBULATORY_CARE_PROVIDER_SITE_OTHER): Payer: Medicare PPO | Admitting: *Deleted

## 2014-03-09 DIAGNOSIS — Z952 Presence of prosthetic heart valve: Secondary | ICD-10-CM

## 2014-03-09 DIAGNOSIS — I4891 Unspecified atrial fibrillation: Secondary | ICD-10-CM

## 2014-03-09 DIAGNOSIS — Z5181 Encounter for therapeutic drug level monitoring: Secondary | ICD-10-CM

## 2014-03-09 DIAGNOSIS — Z954 Presence of other heart-valve replacement: Secondary | ICD-10-CM

## 2014-03-09 LAB — POCT INR: INR: 2

## 2014-03-16 ENCOUNTER — Ambulatory Visit (INDEPENDENT_AMBULATORY_CARE_PROVIDER_SITE_OTHER): Payer: Medicare PPO | Admitting: *Deleted

## 2014-03-16 DIAGNOSIS — Z5181 Encounter for therapeutic drug level monitoring: Secondary | ICD-10-CM

## 2014-03-16 DIAGNOSIS — I4891 Unspecified atrial fibrillation: Secondary | ICD-10-CM

## 2014-03-16 DIAGNOSIS — Z952 Presence of prosthetic heart valve: Secondary | ICD-10-CM

## 2014-03-16 DIAGNOSIS — Z954 Presence of other heart-valve replacement: Secondary | ICD-10-CM

## 2014-03-16 LAB — POCT INR: INR: 3

## 2014-03-16 MED ORDER — WARFARIN SODIUM 5 MG PO TABS: ORAL_TABLET | ORAL | Status: DC

## 2014-03-30 ENCOUNTER — Ambulatory Visit (INDEPENDENT_AMBULATORY_CARE_PROVIDER_SITE_OTHER): Payer: Medicare PPO | Admitting: *Deleted

## 2014-03-30 DIAGNOSIS — Z954 Presence of other heart-valve replacement: Secondary | ICD-10-CM

## 2014-03-30 DIAGNOSIS — I4891 Unspecified atrial fibrillation: Secondary | ICD-10-CM

## 2014-03-30 DIAGNOSIS — Z5181 Encounter for therapeutic drug level monitoring: Secondary | ICD-10-CM

## 2014-03-30 DIAGNOSIS — Z952 Presence of prosthetic heart valve: Secondary | ICD-10-CM

## 2014-03-30 LAB — POCT INR: INR: 2.5

## 2014-04-15 ENCOUNTER — Ambulatory Visit (INDEPENDENT_AMBULATORY_CARE_PROVIDER_SITE_OTHER): Payer: Medicare PPO | Admitting: Cardiology

## 2014-04-15 ENCOUNTER — Encounter: Payer: Self-pay | Admitting: Cardiology

## 2014-04-15 VITALS — BP 220/63 | HR 49 | Ht 70.0 in | Wt 181.0 lb

## 2014-04-15 DIAGNOSIS — I10 Essential (primary) hypertension: Secondary | ICD-10-CM

## 2014-04-15 DIAGNOSIS — Z954 Presence of other heart-valve replacement: Secondary | ICD-10-CM

## 2014-04-15 DIAGNOSIS — Z952 Presence of prosthetic heart valve: Secondary | ICD-10-CM

## 2014-04-15 DIAGNOSIS — N184 Chronic kidney disease, stage 4 (severe): Secondary | ICD-10-CM

## 2014-04-15 DIAGNOSIS — I480 Paroxysmal atrial fibrillation: Secondary | ICD-10-CM

## 2014-04-15 MED ORDER — CARVEDILOL 3.125 MG PO TABS: 3.1250 mg | ORAL_TABLET | Freq: Two times a day (BID) | ORAL | Status: DC

## 2014-04-15 MED ORDER — HYDRALAZINE HCL 50 MG PO TABS: 75.0000 mg | ORAL_TABLET | Freq: Three times a day (TID) | ORAL | Status: DC

## 2014-04-15 MED ORDER — LISINOPRIL 10 MG PO TABS: 10.0000 mg | ORAL_TABLET | Freq: Every day | ORAL | Status: DC

## 2014-04-15 NOTE — Progress Notes (Signed)
Clinical Summary [name] last seen in July. He is here with his daughter today. Seems to be doing fairly well in terms of functional status. Blood pressure has been trending up. He states his systolics are generally in the 160s to 170s at home.  Labwork in July showed BUN 44, creatinine 2.5 (stable), potassium 4.8. Continues to follow in the anticoagulation clinic. He has had no further atrial fibrillation following his valve procedure. No longer on amiodarone.  Followup echocardiogram in June demonstrated mild LVH with LVEF 60-65%, aortic prosthesis with mean gradient 10 mm mercury and trace perivalvular regurgitation, mild mitral regurgitation.  Today we discussed medication adjustments for more optimal management of blood pressure. Also considerations about continued use of anticoagulation.  Allergies  Allergen Reactions  . Lipitor [Atorvastatin Calcium]     Leg pain    Current Outpatient Prescriptions  Medication Sig Dispense Refill  . acetaminophen (TYLENOL) 325 MG tablet Take 650 mg by mouth every 6 (six) hours as needed for mild pain.       Marland Kitchen. amoxicillin (AMOXIL) 500 MG capsule Take 4 capsules (2,000 mg total) by mouth as directed.  12 capsule  0  . aspirin 81 MG tablet Take 81 mg by mouth daily.        . famotidine (PEPCID) 20 MG tablet Take 20 mg by mouth 2 (two) times daily as needed for heartburn.       . furosemide (LASIX) 20 MG tablet Take 1 tablet (20 mg total) by mouth daily as needed. For leg edema  90 tablet  0  . hydrALAZINE (APRESOLINE) 50 MG tablet Take 1.5 tablets (75 mg total) by mouth 3 (three) times daily.  320 tablet  3  . isosorbide mononitrate (IMDUR) 30 MG 24 hr tablet Take 15 mg by mouth daily.      . nitroGLYCERIN (NITROSTAT) 0.4 MG SL tablet Place 1 tablet (0.4 mg total) under the tongue every 5 (five) minutes as needed.  25 tablet  3  . Omega-3 Fatty Acids (FISH OIL) 1200 MG CAPS Take 1 capsule by mouth daily.       Marland Kitchen. warfarin  (COUMADIN) 5 MG tablet Take 1 tablet daily except 1/2 tablet on Tuesdays and Fridays  90 tablet  3  . carvedilol (COREG) 3.125 MG tablet Take 1 tablet (3.125 mg total) by mouth 2 (two) times daily.  180 tablet  3  . lisinopril (PRINIVIL,ZESTRIL) 10 MG tablet Take 1 tablet (10 mg total) by mouth daily.  90 tablet  3   No current facility-administered medications for this visit.    Past Medical History  Diagnosis Date  . Coronary atherosclerosis of native coronary artery     Multivessel, LVEF 65%  . Mixed hyperlipidemia   . Essential hypertension, benign   . Myocardial infarction   . CKD (chronic kidney disease) stage 3, GFR 30-59 ml/min   . Arthritis   . Aortic stenosis   . 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 Umbilical hernia repair    . Appendectomy    . Left total knee arthroplasty  04  . Cataract extraction Left 04/27/13    Dr. Lita MainsHaines  . Eye surgery    . Coronary artery bypass graft      LIMA to LAD, SVG to OM, SVG to RCA,  SVG to diagonal 11/04  . Transcatheter aortic valve replacement, transfemoral N/A 11/17/2013    Procedure: TRANSCATHETER AORTIC VALVE REPLACEMENT, TRANSFEMORAL;  Surgeon: Tonny BollmanMichael Cooper, MD;  [facility];  Service: Open Heart Surgery;  Laterality: N/A;  . Intraoperative transesophageal echocardiogram N/A 11/17/2013    Procedure: INTRAOPERATIVE TRANSESOPHAGEAL ECHOCARDIOGRAM;  Surgeon: Tonny BollmanMichael Cooper, MD;  [facility];  Service: Open Heart Surgery;  Laterality: N/A;    Social History [name] that he has never smoked. He has never used smokeless tobacco. [name] that he does not drink alcohol.  Review of Systems No reported bleeding problems. Still with memory deficits. Appetite is stable. No falls. Otherwise systems reviewed and negative.  Physical  Examination Filed Vitals:   04/15/14 0934  BP: 220/63  Pulse: 49   Filed Weights   04/15/14 0924  Weight: 181 lb (82.101 kg)    Overweight male in no acute distress, hard of hearing.  HEENT: Conjunctiva and lids normal, oropharynx clear.  Neck: Supple, no elevated jugular venous pressure. No thyromegaly.  Lungs: Clear without labored breathing.  Cardiac: Regular rate and rhythm with 2/6 systolic murmur at the base, 2/6 diastolic murmur as well. No S3 gallop.  Abdomen: Soft, nontender, no bruits. Bowel sounds present.  Skin: Warm and dry.  Extremities: Mild ankle edema. Distal pulses 2+.  Musculoskeletal: No kyphosis.  Neuropsychiatric: Alert oriented x3. Affect normal.  Problem List and Plan   S/P TAVR (transcatheter aortic valve replacement) Stable functionally, echocardiogram from June reviewed above.  CKD (chronic kidney disease) stage 4, GFR 15-29 ml/min Creatinine stable at 2.5. Previously when on lisinopril 40 mg daily his creatinine was around 1.8-2.0. Will cautiously resume lisinopril 10 mg daily to try to get better handle on blood pressure, need followup BMET.  Atrial fibrillation Noted in the periprocedural setting. This has not been recurrent and he is off amiodarone. Continues on Coumadin for now.  Essential hypertension, benign Change from Lopressor to Coreg 3.125 mg twice daily, resume lisinopril at 10 mg daily. Continue current dose of hydralazine. Followup BMET and will have clinic visit in a month.    Jonelle SidleSamuel G. Seairra Otani, M.D., F.A.C.C.

## 2014-04-15 NOTE — Assessment & Plan Note (Signed)
Stable functionally, echocardiogram from June reviewed above.

## 2014-04-15 NOTE — Assessment & Plan Note (Signed)
Creatinine stable at 2.5. Previously when on lisinopril 40 mg daily his creatinine was around 1.8-2.0. Will cautiously resume lisinopril 10 mg daily to try to get better handle on blood pressure, need followup BMET.

## 2014-04-15 NOTE — Assessment & Plan Note (Signed)
Change from Lopressor to Coreg 3.125 mg twice daily, resume lisinopril at 10 mg daily. Continue current dose of hydralazine. Followup BMET and will have clinic visit in a month.

## 2014-04-15 NOTE — Assessment & Plan Note (Signed)
Noted in the periprocedural setting. This has not been recurrent and he is off amiodarone. Continues on Coumadin for now.

## 2014-04-15 NOTE — Patient Instructions (Signed)
Your physician recommends that you schedule a follow-up appointment in: 3-4 weeks. Your physician has recommended you make the following change in your medication:  Stop metoprolol tartrate. Start carvedilol 3.125 mg twice daily. Start lisinopril 10 mg daily. Continue all other medications the same. Your physician recommends that you have lab work in 2 weeks around 04/29/14 to check your BMET. Your physician has requested that you regularly monitor and record your blood pressure readings at home. Please use the same machine at the same time of day to check your readings and record them. Please email there results to your doctor through Mychart.

## 2014-04-16 ENCOUNTER — Telehealth: Payer: Self-pay | Admitting: *Deleted

## 2014-04-16 MED ORDER — METOPROLOL TARTRATE 50 MG PO TABS: 50.0000 mg | ORAL_TABLET | Freq: Two times a day (BID) | ORAL | Status: DC

## 2014-04-16 NOTE — Telephone Encounter (Signed)
Noted. I did see him yesterday and made adjustments in his medications. Coreg is new in place of Lopressor. Lisinopril was just increased for better blood pressure control. Unclear that all these symptoms are related to Coreg, however would stop it for now and go back to Lopressor. Certainly, if his symptoms do not improve, he should be seen more urgently in ER setting.

## 2014-04-16 NOTE — Telephone Encounter (Signed)
Per wife Alona Bene(Joyce) patient c/o not feeling well this morning.  Complaints of heart, back pain, & just hurting all over.  Also c/o dizziness.  No SOB.  Asked wife to have patient rate his chest pain & he could only say it felt bad.  Also, c/o hands feeling numb.  Patient last seen yesterday & Metoprolol was stopped.  Coreg 3.125mg  twice a day was started & Lisinopril increased up to 10mg  daily.  States he has only been feeling this way this morning since beginning new medication.    Discussed above with Dr. Diona BrownerMcDowell - stop Coreg & resume Lopressor 50mg  twice a day.    Wife Alona Bene(Joyce) notified.  Informed wife to call 911 / proceed to ED if symptoms worsen.  She verbalized understanding.

## 2014-04-20 ENCOUNTER — Ambulatory Visit (INDEPENDENT_AMBULATORY_CARE_PROVIDER_SITE_OTHER): Payer: Medicare PPO | Admitting: *Deleted

## 2014-04-20 DIAGNOSIS — Z952 Presence of prosthetic heart valve: Secondary | ICD-10-CM

## 2014-04-20 DIAGNOSIS — Z954 Presence of other heart-valve replacement: Secondary | ICD-10-CM

## 2014-04-20 DIAGNOSIS — I4891 Unspecified atrial fibrillation: Secondary | ICD-10-CM

## 2014-04-20 DIAGNOSIS — Z5181 Encounter for therapeutic drug level monitoring: Secondary | ICD-10-CM

## 2014-04-20 LAB — POCT INR: INR: 1.9

## 2014-04-20 NOTE — Telephone Encounter (Signed)
Called and discussed with daughter. Patient is feeling better and blood pressure today is 139/63. Per daughter, patient will be coming off coumadin today after seeing Misty StanleyLisa. Patient had lab work with Dr. Olena LeatherwoodHasanaj this week. Nurse requested labs to determine if patient still need lab work next week due to recent medication changes and request at last office visit. Please see BP readings below that were sent by daughter.   BP readings from the weekend. (New med taken Friday 10/16, Lopressor resumed 10/17) Oct 16 745a 178/69 9a 167/68  Oct 17 745a 120/46 8a 122/51 9a 132/58 Oct 18 8a 132/54 8p 176/64  Oct 19 8a 160/62

## 2014-04-27 ENCOUNTER — Ambulatory Visit (INDEPENDENT_AMBULATORY_CARE_PROVIDER_SITE_OTHER): Payer: Medicare PPO | Admitting: *Deleted

## 2014-04-27 DIAGNOSIS — Z954 Presence of other heart-valve replacement: Secondary | ICD-10-CM

## 2014-04-27 DIAGNOSIS — I4891 Unspecified atrial fibrillation: Secondary | ICD-10-CM

## 2014-04-27 DIAGNOSIS — Z5181 Encounter for therapeutic drug level monitoring: Secondary | ICD-10-CM

## 2014-04-27 DIAGNOSIS — Z952 Presence of prosthetic heart valve: Secondary | ICD-10-CM

## 2014-04-27 LAB — POCT INR: INR: 3.1

## 2014-05-03 ENCOUNTER — Encounter: Payer: Self-pay | Admitting: *Deleted

## 2014-05-06 ENCOUNTER — Encounter: Payer: Self-pay | Admitting: Cardiology

## 2014-05-06 ENCOUNTER — Ambulatory Visit (INDEPENDENT_AMBULATORY_CARE_PROVIDER_SITE_OTHER): Payer: Medicare PPO | Admitting: Cardiology

## 2014-05-06 ENCOUNTER — Ambulatory Visit (INDEPENDENT_AMBULATORY_CARE_PROVIDER_SITE_OTHER): Payer: Medicare PPO | Admitting: *Deleted

## 2014-05-06 VITALS — BP 160/72 | HR 65 | Ht 70.0 in | Wt 181.0 lb

## 2014-05-06 DIAGNOSIS — Z952 Presence of prosthetic heart valve: Secondary | ICD-10-CM

## 2014-05-06 DIAGNOSIS — Z5181 Encounter for therapeutic drug level monitoring: Secondary | ICD-10-CM

## 2014-05-06 DIAGNOSIS — I10 Essential (primary) hypertension: Secondary | ICD-10-CM

## 2014-05-06 DIAGNOSIS — I480 Paroxysmal atrial fibrillation: Secondary | ICD-10-CM

## 2014-05-06 DIAGNOSIS — I4891 Unspecified atrial fibrillation: Secondary | ICD-10-CM

## 2014-05-06 DIAGNOSIS — Z954 Presence of other heart-valve replacement: Secondary | ICD-10-CM

## 2014-05-06 LAB — POCT INR: INR: 2.3

## 2014-05-06 MED ORDER — LISINOPRIL 20 MG PO TABS: 20.0000 mg | ORAL_TABLET | Freq: Every day | ORAL | Status: DC

## 2014-05-06 NOTE — Assessment & Plan Note (Signed)
Increase lisinopril to 20 mg daily, follow-up BMET in 2 weeks. Continue to monitor blood pressure at home.

## 2014-05-06 NOTE — Assessment & Plan Note (Signed)
He is doing well overall, echocardiogram from June noted above.

## 2014-05-06 NOTE — Assessment & Plan Note (Signed)
Follow-up creatinine 2.5 on ACE inhibitor, stable.

## 2014-05-06 NOTE — Patient Instructions (Signed)
Your physician recommends that you schedule a follow-up appointment in: 6 weeks. Your physician has recommended you make the following change in your medication:  STOP COUMADIN. Increase lisinopril to 20 mg daily. You may take (2) of your 10 mg tablets daily until they are finished. Continue all other medications the same. Your physician recommends that you have lab work in about 2 weeks around 05/20/14 to check your BMET.

## 2014-05-06 NOTE — Progress Notes (Signed)
Reason for visit: CAD, status post TAVR, hypertension  Clinical Summary [name] last seen in October.at the last visit we resumed lisinopril 10 mg daily and tried to switch from Lopressor to Coreg for better blood pressure control. He did not tolerate Coreg and went back to Lopressor.  Follow-up BMET in late October showed BUN 42, creatinine stable at 2.5, and potassium 4.9.  I did communicate with Dr. Excell Hayes in the meanwhile about the patient potentially stopping Coumadin at this point, and he agreed. This presumes that [name] does not experiencing any further atrial fibrillation however.  He is here with his daughter today. He has been feeling better overall. Home blood pressure checks finds systolics in the 150s in general. Recheck of his blood pressure today found his systolic to be 160. He is not reporting any chest pain, has NYHA class II dyspnea.  Followup echocardiogram in June demonstrated mild LVH with LVEF 60-65%, aortic prosthesis with mean gradient 10 mm mercury and trace perivalvular regurgitation, mild mitral regurgitation.  Allergies  Allergen Reactions  . Lipitor [Atorvastatin Calcium]     Leg pain    Current Outpatient Prescriptions  Medication Sig Dispense Refill  . acetaminophen (TYLENOL) 325 MG tablet Take 650 mg by mouth every 6 (six) hours as needed for mild pain.     Marland Kitchen. amoxicillin (AMOXIL) 500 MG capsule Take 4 capsules (2,000 mg total) by mouth as directed. 12 capsule 0  . aspirin 81 MG tablet Take 81 mg by mouth daily.      . famotidine (PEPCID) 20 MG tablet Take 20 mg by mouth 2 (two) times daily as needed for heartburn.     . furosemide (LASIX) 20 MG tablet Take 1 tablet (20 mg total) by mouth daily as needed. For leg edema 90 tablet 0  . hydrALAZINE (APRESOLINE) 50 MG tablet Take 1.5 tablets (75 mg total) by mouth 3 (three) times daily. 320 tablet 3  . isosorbide mononitrate (IMDUR) 30 MG 24 hr tablet Take 15 mg by mouth daily.      . metoprolol (LOPRESSOR) 50 MG tablet Take 1 tablet (50 mg total) by mouth 2 (two) times daily.    . nitroGLYCERIN (NITROSTAT) 0.4 MG SL tablet Place 1 tablet (0.4 mg total) under the tongue every 5 (five) minutes as needed. 25 tablet 3  . Omega-3 Fatty Acids (FISH OIL) 1200 MG CAPS Take 1 capsule by mouth daily.     Marland Kitchen. lisinopril (PRINIVIL,ZESTRIL) 20 MG tablet Take 1 tablet (20 mg total) by mouth daily. 90 tablet 3   No current facility-administered medications for this visit.    Past Medical History  Diagnosis Date  . Coronary atherosclerosis of native coronary artery     Multivessel, LVEF 65%  . Mixed hyperlipidemia   . Essential hypertension, benign   . Myocardial infarction   . CKD (chronic kidney disease) stage 3, GFR 30-59 ml/min   . Arthritis   . Aortic stenosis   . 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 that he has never smoked. He has never used smokeless tobacco. [name] that he does not drink alcohol.  Review of Systems Complete review of systems negative except as otherwise outlined in the clinical summary and also the following. Hard of hearing. Reports stable appetite.  Physical Examination Filed Vitals:   05/06/14  0904  BP: 160/72  Pulse: 65   Filed Weights   05/06/14 0840  Weight: 181 lb (82.101 kg)    Overweight male in no acute distress, hard of hearing.  HEENT: Conjunctiva and lids normal, oropharynx clear.  Neck: Supple, no elevated jugular venous pressure. No thyromegaly.  Lungs: Clear without labored breathing.  Cardiac: Regular rate and rhythm with 2/6 systolic murmur at the base, 2/6 diastolic murmur as well. No S3 gallop.  Abdomen: Soft, nontender, no bruits. Bowel sounds present.  Skin: Warm and dry.  Extremities: Mild ankle edema. Distal pulses 2+.    Problem List  and Plan   Essential hypertension, benign Increase lisinopril to 20 mg daily, follow-up BMET in 2 weeks. Continue to monitor blood pressure at home.  S/P TAVR (transcatheter aortic valve replacement) He is doing well overall, echocardiogram from June noted above.  CKD (chronic kidney disease) stage 4, GFR 15-29 ml/min Follow-up creatinine 2.5 on ACE inhibitor, stable.  Atrial fibrillation This was only documented around the time of his TAVR. I have communicated with Dr. Excell Hayes, and plan at this time is to stop Coumadin. Certainly, if he has recurring atrial arrhythmias, we can reinstitute anticoagulation.    Craig SidleSamuel G. Hayes, M.D., F.A.C.C.

## 2014-05-06 NOTE — Assessment & Plan Note (Signed)
This was only documented around the time of his TAVR. I have communicated with Dr. Excell Seltzerooper, and plan at this time is to stop Coumadin. Certainly, if he has recurring atrial arrhythmias, we can reinstitute anticoagulation.

## 2014-05-21 ENCOUNTER — Telehealth: Payer: Self-pay | Admitting: *Deleted

## 2014-05-21 DIAGNOSIS — I10 Essential (primary) hypertension: Secondary | ICD-10-CM

## 2014-05-21 DIAGNOSIS — N184 Chronic kidney disease, stage 4 (severe): Secondary | ICD-10-CM

## 2014-05-21 NOTE — Telephone Encounter (Signed)
-----   Message from Jonelle SidleSamuel G McDowell, MD sent at 05/21/2014  9:41 AM EST ----- Reviewed. Creatinine is actually down to 2.0, potassium mildly increased at 5.5. He is not on any potassium supplements. For now continue same regimen, he will need a follow-up BMET around the time of his next visit.

## 2014-05-21 NOTE — Telephone Encounter (Signed)
Daughter informed. [facility] and daughter [phone] fax number.

## 2014-06-10 ENCOUNTER — Encounter (HOSPITAL_COMMUNITY): Payer: Self-pay | Admitting: Cardiovascular Disease

## 2014-06-21 ENCOUNTER — Encounter: Payer: Self-pay | Admitting: Cardiology

## 2014-06-21 ENCOUNTER — Ambulatory Visit (INDEPENDENT_AMBULATORY_CARE_PROVIDER_SITE_OTHER): Payer: Medicare PPO | Admitting: Cardiology

## 2014-06-21 VITALS — BP 128/58 | HR 55 | Ht 70.0 in | Wt 181.0 lb

## 2014-06-21 DIAGNOSIS — I480 Paroxysmal atrial fibrillation: Secondary | ICD-10-CM

## 2014-06-21 DIAGNOSIS — Z954 Presence of other heart-valve replacement: Secondary | ICD-10-CM

## 2014-06-21 DIAGNOSIS — I10 Essential (primary) hypertension: Secondary | ICD-10-CM

## 2014-06-21 DIAGNOSIS — N184 Chronic kidney disease, stage 4 (severe): Secondary | ICD-10-CM

## 2014-06-21 DIAGNOSIS — Z952 Presence of prosthetic heart valve: Secondary | ICD-10-CM

## 2014-06-21 NOTE — Assessment & Plan Note (Signed)
Echocardiogram from June noted above. Clinically stable.

## 2014-06-21 NOTE — Assessment & Plan Note (Signed)
Plan to continue current regimen at this time including hydralazine, Imdur, Lopressor, and lisinopril.

## 2014-06-21 NOTE — Assessment & Plan Note (Signed)
No further episodes noted. 

## 2014-06-21 NOTE — Progress Notes (Signed)
Reason for visit: Status post TAVR, CAD  Clinical Summary [name] is an 80 y.o.male last seen in November. At that time Coumadin was discontinued, and lisinopril dose was increased. He is here with his daughter. Overall doing reasonably well, no major change in functional status. He has tolerated the medication adjustments, states that he does not bruise quite easily. Blood pressure checks at home find systolics in the 120s to 140s in general. I rechecked his blood pressure in the right arm today at 128/58. Weight is stable.  Recent follow-up lab work shows BUN 45, creatinine 2.1, and potassium 4.8. Degree of renal dysfunction has overall stabilized as well.  Followup echocardiogram in June demonstrated mild LVH with LVEF 60-65%, aortic prosthesis with mean gradient 10 mm mercury and trace perivalvular regurgitation, mild mitral regurgitation.  Allergies  Allergen Reactions  . Lipitor [Atorvastatin Calcium]     Leg pain    Current Outpatient Prescriptions  Medication Sig Dispense Refill  . acetaminophen (TYLENOL) 325 MG tablet Take 650 mg by mouth every 6 (six) hours as needed for mild pain.     Marland Kitchen. aspirin 81 MG tablet Take 81 mg by mouth daily.      . famotidine (PEPCID) 20 MG tablet Take 20 mg by mouth 2 (two) times daily as needed for heartburn.     . furosemide (LASIX) 20 MG tablet Take 1 tablet (20 mg total) by mouth daily as needed. For leg edema 90 tablet 0  . hydrALAZINE (APRESOLINE) 50 MG tablet Take 1.5 tablets (75 mg total) by mouth 3 (three) times daily. 320 tablet 3  . isosorbide mononitrate (IMDUR) 30 MG 24 hr tablet Take 15 mg by mouth daily.    Marland Kitchen. lisinopril (PRINIVIL,ZESTRIL) 20 MG tablet Take 1 tablet (20 mg total) by mouth daily. 90 tablet 3  . metoprolol (LOPRESSOR) 50 MG tablet Take 1 tablet (50 mg total) by mouth 2 (two) times daily.    . nitroGLYCERIN (NITROSTAT) 0.4 MG SL tablet Place 1 tablet (0.4 mg total) under the tongue every 5 (five) minutes as needed. 25  tablet 3  . Omega-3 Fatty Acids (FISH OIL) 1200 MG CAPS Take 1 capsule by mouth daily.     Marland Kitchen. sulfamethoxazole-trimethoprim (BACTRIM DS,SEPTRA DS) 800-160 MG per tablet Take 1 tablet by mouth 2 (two) times daily.     No current facility-administered medications for this visit.    Past Medical History  Diagnosis Date  . Coronary atherosclerosis of native coronary artery     Multivessel, LVEF 65%  . Mixed hyperlipidemia   . Essential hypertension, benign   . Myocardial infarction   . CKD (chronic kidney disease) stage 3, GFR 30-59 ml/min   . Arthritis   . Aortic stenosis   . 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 Umbilical hernia repair    . Appendectomy    . Left total knee arthroplasty  04  . Cataract extraction Left 04/27/13    Dr. Lita MainsHaines  . Eye surgery    . Coronary artery bypass graft      LIMA to LAD, SVG to OM, SVG to RCA, SVG to diagonal 11/04  . Transcatheter aortic valve replacement, transfemoral N/A 11/17/2013    Procedure: TRANSCATHETER AORTIC VALVE REPLACEMENT, TRANSFEMORAL;  Surgeon: Tonny BollmanMichael Cooper, MD;  [facility];  Service: Open Heart Surgery;  Laterality: N/A;  . Intraoperative transesophageal echocardiogram N/A 11/17/2013    Procedure: INTRAOPERATIVE TRANSESOPHAGEAL ECHOCARDIOGRAM;  Surgeon: Tonny BollmanMichael Cooper, MD;  [facility];  Service: Open Heart Surgery;  Laterality: N/A;  . Left and right heart catheterization with coronary/graft angiogram N/A 10/13/2013    Procedure: LEFT AND RIGHT HEART CATHETERIZATION WITH Isabel CapriceORONARY/GRAFT ANGIOGRAM;  Surgeon: Micheline ChapmanMichael D Cooper, MD;  [facility];  Service: Cardiovascular;  Laterality: N/A;    Social History [name] reports that he has never smoked. He has never used smokeless tobacco. [name] reports that he does not drink  alcohol.  Review of Systems Complete review of systems negative except as otherwise outlined in the clinical summary and also the following.  Physical Examination Filed Vitals:   06/21/14 0856  BP: 128/58  Pulse: 55   Filed Weights   06/21/14 0837  Weight: 181 lb (82.101 kg)    Overweight male in no acute distress, hard of hearing.  HEENT: Conjunctiva and lids normal, oropharynx clear.  Neck: Supple, no elevated jugular venous pressure. No thyromegaly.  Lungs: Clear without labored breathing.  Cardiac: Regular rate and rhythm with 2/6 systolic murmur at the base, 2/6 diastolic murmur as well. No S3 gallop.  Abdomen: Soft, nontender, no bruits. Bowel sounds present.  Skin: Warm and dry.  Extremities: Mild ankle edema. Distal pulses 2+.    Problem List and Plan   Essential hypertension, benign Plan to continue current regimen at this time including hydralazine, Imdur, Lopressor, and lisinopril.  CKD (chronic kidney disease) stage 4, GFR 15-29 ml/min Follow-up lab work shows creatinine 2.1, overall stabilized. Follow-up BMET for next visit in 2 months.  S/P TAVR (transcatheter aortic valve replacement) Echocardiogram from June noted above. Clinically stable.  Atrial fibrillation No further episodes noted.    Jonelle SidleSamuel G. Jade Burright, M.D., F.A.C.C.

## 2014-06-21 NOTE — Patient Instructions (Signed)
Your physician recommends that you schedule a follow-up appointment in: 2 months.  Your physician recommends that you continue on your current medications as directed. Please refer to the Current Medication list given to you today. Your physician recommends that you return have lab work in 2 months just before your next visit in February 2016 to check your BMET.

## 2014-06-21 NOTE — Assessment & Plan Note (Signed)
Follow-up lab work shows creatinine 2.1, overall stabilized. Follow-up BMET for next visit in 2 months.

## 2014-08-19 ENCOUNTER — Telehealth: Payer: Self-pay | Admitting: *Deleted

## 2014-08-19 NOTE — Telephone Encounter (Signed)
Daughter informed

## 2014-08-19 NOTE — Telephone Encounter (Signed)
-----   Message from Jonelle SidleSamuel G McDowell, MD sent at 08/18/2014  5:05 PM EST ----- Reviewed. Creatinine has increased from 2.1-2.4, potassium mildly increased at 5.3. We will review further at follow-up visit, may need to back off on lisinopril again depending on blood pressure.

## 2014-08-23 ENCOUNTER — Ambulatory Visit (INDEPENDENT_AMBULATORY_CARE_PROVIDER_SITE_OTHER): Payer: Medicare PPO | Admitting: Cardiology

## 2014-08-23 ENCOUNTER — Encounter: Payer: Self-pay | Admitting: Cardiology

## 2014-08-23 VITALS — BP 142/62 | HR 46 | Ht 70.0 in | Wt 176.8 lb

## 2014-08-23 DIAGNOSIS — I10 Essential (primary) hypertension: Secondary | ICD-10-CM

## 2014-08-23 DIAGNOSIS — I359 Nonrheumatic aortic valve disorder, unspecified: Secondary | ICD-10-CM

## 2014-08-23 DIAGNOSIS — N183 Chronic kidney disease, stage 3 unspecified: Secondary | ICD-10-CM

## 2014-08-23 NOTE — Progress Notes (Signed)
Cardiology Office Note  Date: 08/23/2014   ID: [name], DOB 02-06-1929, MRN [id number]  PCP: Toma Deiters, MD  Primary Cardiologist: Nona Dell, MD   Chief Complaint  Patient presents with  . Aortic valve disease  . Coronary Artery Disease  . Chronic Kidney Disease    History of Present Illness: [name] is an 81 y.o. male last seen in December 2015. He is status post TAVR by Dr. Excell Seltzer back in May 2015. He is here for a routine visit today with his daughter. Overall doing about the same, NYHA class II dyspnea, no chest pain. He does some walking at the gym in his church for exercise.  We reviewed his medications, he has not used any Lasix, reports no significant leg edema. Follow-up lab work is noted below, we have elected to continue lisinopril at current dose, creatinine ranging 2.1-2.4. He has not required a potassium supplement.  He will be seeing Dr. Excell Seltzer in May of this year with follow-up echocardiogram.   Past Medical History  Diagnosis Date  . Coronary atherosclerosis of native coronary artery     Multivessel, LVEF 65%  . Mixed hyperlipidemia   . Essential hypertension, benign   . Myocardial infarction   . CKD (chronic kidney disease) stage 3, GFR 30-59 ml/min   . Arthritis   . Aortic stenosis   . 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 acetaminophen (TYLENOL) 325 MG tablet Take 650 mg by mouth every 6 (six) hours as needed for mild pain.     Marland Kitchen aspirin 81 MG tablet Take 81 mg by mouth daily.      . famotidine (PEPCID) 20 MG tablet Take 20 mg by mouth 2 (two) times daily as needed for heartburn.     . furosemide (LASIX) 20 MG tablet Take 1 tablet (20 mg total) by mouth daily as needed. For leg edema 90 tablet 0  . hydrALAZINE (APRESOLINE) 50 MG tablet  Take 1.5 tablets (75 mg total) by mouth 3 (three) times daily. 320 tablet 3  . isosorbide mononitrate (IMDUR) 30 MG 24 hr tablet Take 15 mg by mouth daily.    Marland Kitchen lisinopril (PRINIVIL,ZESTRIL) 20 MG tablet Take 1 tablet (20 mg total) by mouth daily. 90 tablet 3  . metoprolol (LOPRESSOR) 50 MG tablet Take 1 tablet (50 mg total) by mouth 2 (two) times daily.    . nitroGLYCERIN (NITROSTAT) 0.4 MG SL tablet Place 1 tablet (0.4 mg total) under the tongue every 5 (five) minutes as needed. 25 tablet 3  . Omega-3 Fatty Acids (FISH OIL) 1200 MG CAPS Take 1 capsule by mouth daily.      No current facility-administered medications for this visit.    Allergies:  Lipitor   Social History: The patient  reports that he has never smoked. He has never used smokeless tobacco. He reports that he does not drink alcohol or use illicit drugs.   ROS:  Please see the history of present illness. Otherwise, complete review of systems is positive for mild arthritis.  All other systems are reviewed and negative.    Physical Exam: VS:  BP 142/62 mmHg  Pulse 46  Ht  (1.778 m)  Wt 176 lb 12.8 oz (80.196 kg)  BMI 25.37 kg/m2  SpO2 100%, BMI Body mass index is 25.37  kg/(m^2).  Wt Readings from Last 3 Encounters:  08/23/14 176 lb 12.8 oz (80.196 kg)  06/21/14 181 lb (82.101 kg)  05/06/14 181 lb (82.101 kg)     Overweight male in no acute distress, hard of hearing.  HEENT: Conjunctiva and lids normal, oropharynx clear.  Neck: Supple, no elevated jugular venous pressure. No thyromegaly.  Lungs: Clear without labored breathing.  Cardiac: Regular rate and rhythm with 2/6 systolic murmur at the base, 2/6 diastolic murmur as well. No S3 gallop.  Abdomen: Soft, nontender, no bruits. Bowel sounds present.  Skin: Warm and dry.  Extremities: Mild ankle edema. Distal pulses 2+.    ECG: ECG is not ordered today.   Recent Labwork:  Recent lab work showed BUN 56, creatinine 2.4 up from 2.1, sodium 136,  potassium 5.3.  Other Studies Reviewed Today:  Echocardiogram in June 2015 demonstrated mild LVH with LVEF 60-65%, aortic prosthesis with mean gradient 10 mm mercury and trace perivalvular regurgitation, mild mitral regurgitation.  Assessment and Plan:  1. Aortic stenosis status post TAVR in May 2015. Patient is symptomatically stable. He has follow-up with Dr. Excell Seltzerooper in May of this year with echocardiogram.   2. Essential hypertension, systolic in the 140s at home per discussion with daughter. For now we will keep current medications without change, he will need a follow-up BMET for his next visit.  3.  CKD, stage 3-4. Most recent creatinine 2.4. Potassium 5.3. Not on potassium supplement.  Current medicines are reviewed at length with the patient today.  The patient does not have concerns regarding medicines.   Orders Placed This Encounter  Procedures  . Basic metabolic panel    Disposition: FU with me in 2 months.   Signed, Jonelle SidleSamuel G. Tonnia Bardin, MD, [facility] 08/23/2014 9:10 AM    [facility] [address], [place], [place] [address] Phone: [phone]; Fax: [phone]

## 2014-08-23 NOTE — Patient Instructions (Signed)
Continue all current medications. BMET - do just prior to next office visit - lab order given today. Follow up in  2 months

## 2014-09-28 ENCOUNTER — Encounter: Payer: Self-pay | Admitting: Cardiology

## 2014-09-28 NOTE — Telephone Encounter (Signed)
Daughter contacted by phone also with response. Daughter wants request to go to Dr. Diona BrownerMcDowell upon his return to the clinic. Nurse advised her that request would be sent but it wasn't a good idea to hold off until Monday if patient was presenting symptoms of confusion. Daughter verbalized understanding and said she would reach out to Dr. Camelia EngHasanj about ordering a urine test.

## 2014-10-05 ENCOUNTER — Encounter: Payer: Self-pay | Admitting: *Deleted

## 2014-10-18 ENCOUNTER — Encounter: Payer: Self-pay | Admitting: Cardiology

## 2014-10-18 ENCOUNTER — Ambulatory Visit (INDEPENDENT_AMBULATORY_CARE_PROVIDER_SITE_OTHER): Payer: Medicare PPO | Admitting: Cardiology

## 2014-10-18 VITALS — BP 162/70 | HR 55 | Ht 70.0 in | Wt 178.0 lb

## 2014-10-18 DIAGNOSIS — N183 Chronic kidney disease, stage 3 unspecified: Secondary | ICD-10-CM

## 2014-10-18 DIAGNOSIS — Z954 Presence of other heart-valve replacement: Secondary | ICD-10-CM | POA: Diagnosis not present

## 2014-10-18 DIAGNOSIS — Z952 Presence of prosthetic heart valve: Secondary | ICD-10-CM

## 2014-10-18 DIAGNOSIS — I10 Essential (primary) hypertension: Secondary | ICD-10-CM | POA: Diagnosis not present

## 2014-10-18 DIAGNOSIS — I350 Nonrheumatic aortic (valve) stenosis: Secondary | ICD-10-CM | POA: Diagnosis not present

## 2014-10-18 DIAGNOSIS — I2510 Atherosclerotic heart disease of native coronary artery without angina pectoris: Secondary | ICD-10-CM

## 2014-10-18 NOTE — Progress Notes (Signed)
Cardiology Office Note  Date: 10/18/2014   ID: Craig DingsJames H Somerville, DOB Oct 05, 1928, MRN [id number]  PCP: Toma DeitersHASANAJ,XAJE A, MD  Primary Cardiologist: Nona DellSamuel Durrell Barajas, MD   Chief Complaint  Patient presents with  . History of TAVR  . Coronary Artery Disease  . Hypertension    History of Present Illness: [name] is an 81 y.o. male last seen in February, status post TAVR by Dr. Excell Seltzerooper in May 2015. He is here today with his daughter for a routine visit. Continues to do fairly well overall. No progressive shortness of breath, only mild lower leg edema at times. He has not required increasing diuretics.  He will be seeing Dr. Excell Seltzerooper next month on the 19th with an echocardiogram for follow-up. His last study is reviewed below.  We discussed his medications and follow-up lab work, overall stable potassium and creatinine levels.  He reports systolic blood pressures generally in the 140s to 150s at home. Blood pressure was higher today, he did miss one dose of his hydralazine yesterday.   Past Medical History  Diagnosis Date  . Coronary atherosclerosis of native coronary artery     Multivessel, LVEF 65%  . Mixed hyperlipidemia   . Essential hypertension, benign   . Myocardial infarction   . CKD (chronic kidney disease) stage 3, GFR 30-59 ml/min   . Arthritis   . Aortic stenosis   . 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 Umbilical hernia repair    . Appendectomy    . Left total knee arthroplasty  04  . Cataract extraction Left 04/27/13    Dr. Lita MainsHaines  . Eye surgery    . Coronary artery bypass graft      LIMA to LAD, SVG to OM, SVG to RCA, SVG to diagonal 11/04  . Transcatheter aortic valve replacement, transfemoral N/A 11/17/2013    Procedure: TRANSCATHETER AORTIC VALVE REPLACEMENT, TRANSFEMORAL;  Surgeon:  Tonny BollmanMichael Cooper, MD;  [facility];  Service: Open Heart Surgery;  Laterality: N/A;  . Intraoperative transesophageal echocardiogram N/A 11/17/2013    Procedure: INTRAOPERATIVE TRANSESOPHAGEAL ECHOCARDIOGRAM;  Surgeon: Tonny BollmanMichael Cooper, MD;  [facility];  Service: Open Heart Surgery;  Laterality: N/A;  . Left and right heart catheterization with coronary/graft angiogram N/A 10/13/2013    Procedure: LEFT AND RIGHT HEART CATHETERIZATION WITH Isabel CapriceORONARY/GRAFT ANGIOGRAM;  Surgeon: Micheline ChapmanMichael D Cooper, MD;  [facility];  Service: Cardiovascular;  Laterality: N/A;    Current Outpatient Prescriptions  Medication Sig Dispense Refill  . acetaminophen (TYLENOL) 325 MG tablet Take 650 mg by mouth every 6 (six) hours as needed for mild pain.     Marland Kitchen. aspirin 81 MG tablet Take 81 mg by mouth daily.      . famotidine (PEPCID) 20 MG tablet Take 20 mg by mouth 2 (two) times daily as needed for heartburn.     . furosemide (LASIX) 20 MG tablet Take 1 tablet (20 mg total) by mouth daily as needed. For leg edema 90 tablet 0  . hydrALAZINE (APRESOLINE) 50 MG tablet Take 1.5 tablets (75 mg total) by mouth 3 (three) times daily. 320 tablet 3  . isosorbide mononitrate (IMDUR) 30 MG 24 hr tablet Take 15 mg by mouth daily.    Marland Kitchen. lisinopril (PRINIVIL,ZESTRIL) 20 MG tablet Take 1 tablet (20 mg total) by mouth daily. 90 tablet  3  . metoprolol (LOPRESSOR) 50 MG tablet Take 1 tablet (50 mg total) by mouth 2 (two) times daily.    . nitroGLYCERIN (NITROSTAT) 0.4 MG SL tablet Place 1 tablet (0.4 mg total) under the tongue every 5 (five) minutes as needed. 25 tablet 3  . Omega-3 Fatty Acids (FISH OIL) 1200 MG CAPS Take 1 capsule by mouth daily.      No current facility-administered medications for this visit.    Allergies:  Lipitor   Social History: The patient  reports that he has never smoked. He has never used smokeless tobacco. He reports that he does not drink alcohol or use illicit drugs.   ROS:  Please see the  history of present illness. Otherwise, complete review of systems is positive for decreased hearing and some memory deficits.  All other systems are reviewed and negative.   Physical Exam: VS:  BP 162/70 mmHg  Pulse 55  Ht  (1.778 m)  Wt 178 lb (80.74 kg)  BMI 25.54 kg/m2  SpO2 99%, BMI Body mass index is 25.54 kg/(m^2).  Wt Readings from Last 3 Encounters:  10/18/14 178 lb (80.74 kg)  08/23/14 176 lb 12.8 oz (80.196 kg)  06/21/14 181 lb (82.101 kg)     Overweight male in no acute distress, hard of hearing.  HEENT: Conjunctiva and lids normal, oropharynx clear.  Neck: Supple, no elevated jugular venous pressure. No thyromegaly.  Lungs: Clear without labored breathing.  Cardiac: Regular rate and rhythm with 2/6 systolic murmur at the base, 1/6 diastolic murmur as well. Split S2. No S3 gallop.  Abdomen: Soft, nontender, no bruits. Bowel sounds present.  Skin: Warm and dry.  Extremities: Mild ankle edema. Distal pulses 2+.    ECG: ECG is not ordered today.   Recent Labwork:  10/05/2014 BUN 42, creatinine 2.2, potassium 5.4, sodium 138. 08/17/2014 BUN 56, creatinine 2.4, potassium 5.3, sodium 136.  Other Studies Reviewed Today:  Echocardiogram in June 2015 demonstrated mild LVH with LVEF 60-65%, aortic prosthesis with mean gradient 10 mm mercury and trace perivalvular regurgitation, mild mitral regurgitation.  Assessment and Plan:  1. History of aortic stenosis status post TAVR in May 2015. Patient will follow-up with Dr. Excell Seltzer next month and have an echocardiogram at that time. He has been clinically stable. No change on examination.  2. Essential hypertension, continue current management and follow-up.  3. CKD, stage 3. Follow-up BMET for next visit.  4. CAD status post CABG, no active angina symptoms.  Current medicines were reviewed with the patient today. No changes were made today.   Orders Placed This Encounter  Procedures  . Basic Metabolic Panel  (BMET)    Disposition: FU with me in 3 months.   Signed, Jonelle Sidle, MD, [facility] 10/18/2014 9:22 AM    [facility] at [facility] [address] [place], [place], [place] [address] Phone: [phone]; Fax: [phone]

## 2014-10-18 NOTE — Patient Instructions (Signed)
Your physician recommends that you schedule a follow-up appointment in: 3 months with Dr. Diona BrownerMcDowell  Your physician recommends that you continue on your current medications as directed. Please refer to the Current Medication list given to you today.  Your physician recommends that you return for lab work JUST PRIOR TO YOUR NEXT VISIT BMP  Thank you for choosing [facility]!!

## 2014-10-25 ENCOUNTER — Other Ambulatory Visit (HOSPITAL_COMMUNITY): Payer: Self-pay | Admitting: Cardiovascular Disease

## 2014-10-25 DIAGNOSIS — I25810 Atherosclerosis of coronary artery bypass graft(s) without angina pectoris: Secondary | ICD-10-CM

## 2014-10-25 DIAGNOSIS — Z952 Presence of prosthetic heart valve: Secondary | ICD-10-CM

## 2014-10-27 ENCOUNTER — Other Ambulatory Visit: Payer: Self-pay | Admitting: *Deleted

## 2014-10-27 MED ORDER — METOPROLOL TARTRATE 50 MG PO TABS: 50.0000 mg | ORAL_TABLET | Freq: Two times a day (BID) | ORAL | Status: DC

## 2014-11-18 NOTE — Progress Notes (Signed)
Cardiology Office Note   Date:  11/19/2014   ID:  Craig DingsJames H Weatherspoon, DOB May 30, 1929, MRN [id number]  PCP:  Toma DeitersHASANAJ,XAJE A, MD  Cardiologist:  Tonny Bollmanooper, Merlon Alcorta, MD    Chief Complaint  Patient presents with  . Aortic Stenosis     History of Present Illness: [name] is a 81 y.o. male who presents for follow-up of aortic valve disease. The patient is one year out from TAVR with a 26 mm Sapien XT valve in May 2015. He has a hx of CABG in 2004.   Patient is here with his wife and daughter today. He has been doing well. He hasn't been as active over the last few months, but plans on increasing his walking out of the weather is getting better. He has mild angina with certain activities, but no progressive changes. He denies orthopnea or PND. He also admits to mild ankle swelling at times. He denies lightheadedness or syncope. He has been followed closely by Dr. Diona BrownerMcDowell.   Past Medical History  Diagnosis Date  . Coronary atherosclerosis of native coronary artery     Multivessel, LVEF 65%  . Mixed hyperlipidemia   . Essential hypertension, benign   . Myocardial infarction   . CKD (chronic kidney disease) stage 3, GFR 30-59 ml/min   . Arthritis   . Aortic stenosis   . 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 Umbilical hernia repair    . Appendectomy    . Left total knee arthroplasty  04  . Cataract extraction Left 04/27/13    Dr. Lita MainsHaines  . Eye surgery    . Coronary artery bypass graft      LIMA to LAD, SVG to OM, SVG to RCA, SVG to diagonal 11/04  . Transcatheter aortic valve replacement, transfemoral N/A 11/17/2013    Procedure: TRANSCATHETER AORTIC VALVE REPLACEMENT, TRANSFEMORAL;  Surgeon: Tonny BollmanMichael Raegan Winders, MD;  [facility];  Service: Open Heart Surgery;  Laterality: N/A;  . Intraoperative  transesophageal echocardiogram N/A 11/17/2013    Procedure: INTRAOPERATIVE TRANSESOPHAGEAL ECHOCARDIOGRAM;  Surgeon: Tonny BollmanMichael Jac Romulus, MD;  [facility];  Service: Open Heart Surgery;  Laterality: N/A;  . Left and right heart catheterization with coronary/graft angiogram N/A 10/13/2013    Procedure: LEFT AND RIGHT HEART CATHETERIZATION WITH Isabel CapriceORONARY/GRAFT ANGIOGRAM;  Surgeon: Micheline ChapmanMichael D Lakeyia Surber, MD;  [facility];  Service: Cardiovascular;  Laterality: N/A;    Current Outpatient Prescriptions  Medication Sig Dispense Refill  . acetaminophen (TYLENOL) 325 MG tablet Take 650 mg by mouth every 6 (six) hours as needed for mild pain.     Marland Kitchen. aspirin 81 MG tablet Take 81 mg by mouth daily.      . famotidine (PEPCID) 20 MG tablet Take 20 mg by mouth 2 (two) times daily as needed for heartburn.     . furosemide (LASIX) 20 MG tablet Take 20 mg by mouth.    . hydrALAZINE (APRESOLINE) 50 MG tablet Take 1.5 tablets (75 mg total) by mouth 3 (three) times daily. 320 tablet 3  . isosorbide mononitrate (IMDUR) 30 MG 24 hr tablet Take 15 mg by mouth daily.    Marland Kitchen. lisinopril (PRINIVIL,ZESTRIL) 20 MG tablet Take 1 tablet (20 mg total) by mouth daily. 90 tablet 3  . metoprolol (LOPRESSOR) 50 MG tablet Take 1 tablet (50 mg total) by mouth 2 (  two) times daily. 180 tablet 3  . nitroGLYCERIN (NITROSTAT) 0.4 MG SL tablet Place 1 tablet (0.4 mg total) under the tongue every 5 (five) minutes as needed. 25 tablet 3  . Omega-3 Fatty Acids (FISH OIL) 1200 MG CAPS Take 1 capsule by mouth daily.      No current facility-administered medications for this visit.    Allergies:   Lipitor   Social History:  The patient  reports that he has never smoked. He has never used smokeless tobacco. He reports that he does not drink alcohol or use illicit drugs.   Family History:  The patient's  family history includes Cirrhosis in his father.    ROS:  Please see the history of present illness.  Otherwise, review of systems is  positive for weight loss, leg swelling, blood in urine, easy bruising.  All other systems are reviewed and negative.    PHYSICAL EXAM: VS:  BP 174/60 mmHg  Pulse 51  Ht  (1.778 m)  Wt 177 lb 12.8 oz (80.65 kg)  BMI 25.51 kg/m2 , BMI Body mass index is 25.51 kg/(m^2). GEN: Well nourished, well developed, elderly male in no acute distress HEENT: normal Neck: no JVD, no masses Cardiac: RRR with grade 2/6 ejection murmur at the right upper sternal border and grade 1/6 diastolic decrescendo murmur      Respiratory:  clear to auscultation bilaterally, normal work of breathing GI: soft, nontender, nondistended, + BS MS: no deformity or atrophy Ext: no pretibial edema Skin: warm and dry, no rash Neuro:  Strength and sensation are intact Psych: euthymic mood, full affect  EKG:  EKG is ordered today. The ekg ordered today shows sinus bradycardia 51 bpm, otherwise within normal limits.  Recent Labs: 11/20/2013: BUN 26*; Creatinine 2.02*; Hemoglobin 11.2*; Platelets 103*; Potassium 4.4; Sodium 137   Lipid Panel  No results found for: CHOL, TRIG, HDL, CHOLHDL, VLDL, LDLCALC, LDLDIRECT    Wt Readings from Last 3 Encounters:  11/19/14 177 lb 12.8 oz (80.65 kg)  10/18/14 178 lb (80.74 kg)  08/23/14 176 lb 12.8 oz (80.196 kg)     Cardiac Studies Reviewed: 2-D echocardiogram 11/19/2014 Study Conclusions  - Left ventricle: The cavity size was normal. Systolic function was normal. The estimated ejection fraction was in the range of 60% to 65%. Wall motion was normal; there were no regional wall motion abnormalities. Features are consistent with a pseudonormal left ventricular filling pattern, with concomitant abnormal relaxation and increased filling pressure (grade 2 diastolic dysfunction). - Aortic valve: S/P TAVR with 26 mm Edwards Sapien XT AVR. There are 2 perivalvular jets that appear at least in the mildly regurgitant range. There was no pressure halftime  measurement obtained. Compared to prior study of 11/2013 there is no significant change. Mean gradient (S): 11 mm Hg. - Mitral valve: Calcified annulus. There was mild regurgitation. - Left atrium: The atrium was moderately dilated. - Pulmonary arteries: PA peak pressure: 42 mm Hg (S).  Impressions:  - The right ventricular systolic pressure was increased consistent with moderate pulmonary hypertension.  ASSESSMENT AND PLAN: Mr Ingrum appears to be doing well now one year out from TAVR. I have personally reviewed his echo images and he has normal gradients across his aortic bioprosthesis. He does have 2 separate small jets indicated mild paravalvular leak. I compared his echo today to his previous study from June 2015 and do not appreciate any significant change. The patient appears clinically stable at this time, with New York Heart Association functional  class II symptoms. He will continue to follow regularly with Dr. Diona BrownerMcDowell. He was advised of the need for SBE prophylaxis and he has been adherent to this. I would be happy to see him back in the future if any problems arise.   Current medicines are reviewed with the patient today.  The patient does not have concerns regarding medicines.  Labs/ tests ordered today include:   Orders Placed This Encounter  Procedures  . EKG 12-Lead    Disposition:   FU as needed  Signed, Tonny Bollmanooper, Rebecah Dangerfield, MD  11/19/2014 2:00 PM    [facility] [address], [place], [place]  [address] Phone: [phone]; Fax: [phone]

## 2014-11-19 ENCOUNTER — Other Ambulatory Visit: Payer: Self-pay

## 2014-11-19 ENCOUNTER — Encounter: Payer: Self-pay | Admitting: Cardiovascular Disease

## 2014-11-19 ENCOUNTER — Other Ambulatory Visit (HOSPITAL_COMMUNITY): Payer: Medicare PPO

## 2014-11-19 ENCOUNTER — Ambulatory Visit (HOSPITAL_COMMUNITY): Payer: Medicare PPO | Attending: Cardiology

## 2014-11-19 ENCOUNTER — Ambulatory Visit (INDEPENDENT_AMBULATORY_CARE_PROVIDER_SITE_OTHER): Payer: Medicare PPO | Admitting: Cardiovascular Disease

## 2014-11-19 VITALS — BP 174/60 | HR 51 | Ht 70.0 in | Wt 177.8 lb

## 2014-11-19 DIAGNOSIS — Z954 Presence of other heart-valve replacement: Secondary | ICD-10-CM | POA: Diagnosis not present

## 2014-11-19 DIAGNOSIS — I10 Essential (primary) hypertension: Secondary | ICD-10-CM | POA: Diagnosis not present

## 2014-11-19 DIAGNOSIS — Z952 Presence of prosthetic heart valve: Secondary | ICD-10-CM

## 2014-11-19 DIAGNOSIS — I25810 Atherosclerosis of coronary artery bypass graft(s) without angina pectoris: Secondary | ICD-10-CM | POA: Diagnosis not present

## 2014-11-19 DIAGNOSIS — I350 Nonrheumatic aortic (valve) stenosis: Secondary | ICD-10-CM

## 2014-11-19 DIAGNOSIS — R609 Edema, unspecified: Secondary | ICD-10-CM | POA: Insufficient documentation

## 2014-11-19 DIAGNOSIS — N184 Chronic kidney disease, stage 4 (severe): Secondary | ICD-10-CM | POA: Insufficient documentation

## 2014-11-19 NOTE — Patient Instructions (Signed)
Medication Instructions:  Your physician recommends that you continue on your current medications as directed. Please refer to the Current Medication list given to you today.  Labwork: No new orders.  Testing/Procedures: No new orders.   Follow-Up: Your physician recommends that you keep routine follow-up appointment with Dr Diona BrownerMcDowell.   Any Other Special Instructions Will Be Listed Below (If Applicable).

## 2014-11-26 ENCOUNTER — Ambulatory Visit: Payer: Medicare PPO | Admitting: Cardiovascular Disease

## 2014-11-26 ENCOUNTER — Other Ambulatory Visit (HOSPITAL_COMMUNITY): Payer: Medicare PPO

## 2014-11-26 ENCOUNTER — Other Ambulatory Visit: Payer: Self-pay | Admitting: *Deleted

## 2014-11-26 MED ORDER — ISOSORBIDE MONONITRATE ER 30 MG PO TB24: 15.0000 mg | ORAL_TABLET | Freq: Every day | ORAL | Status: DC

## 2014-12-27 ENCOUNTER — Other Ambulatory Visit: Payer: Self-pay

## 2015-01-18 ENCOUNTER — Other Ambulatory Visit: Payer: Self-pay | Admitting: *Deleted

## 2015-01-18 MED ORDER — HYDRALAZINE HCL 50 MG PO TABS: 75.0000 mg | ORAL_TABLET | Freq: Three times a day (TID) | ORAL | Status: DC

## 2015-01-18 NOTE — Telephone Encounter (Signed)
Apresoline refilled today.

## 2015-01-24 ENCOUNTER — Encounter: Payer: Self-pay | Admitting: Cardiology

## 2015-01-31 ENCOUNTER — Encounter: Payer: Self-pay | Admitting: Cardiology

## 2015-01-31 ENCOUNTER — Ambulatory Visit (INDEPENDENT_AMBULATORY_CARE_PROVIDER_SITE_OTHER): Payer: Medicare PPO | Admitting: Cardiology

## 2015-01-31 VITALS — BP 154/60 | HR 50 | Ht 70.0 in | Wt 177.0 lb

## 2015-01-31 DIAGNOSIS — I209 Angina pectoris, unspecified: Secondary | ICD-10-CM

## 2015-01-31 DIAGNOSIS — Z954 Presence of other heart-valve replacement: Secondary | ICD-10-CM | POA: Diagnosis not present

## 2015-01-31 DIAGNOSIS — I480 Paroxysmal atrial fibrillation: Secondary | ICD-10-CM | POA: Diagnosis not present

## 2015-01-31 DIAGNOSIS — N183 Chronic kidney disease, stage 3 unspecified: Secondary | ICD-10-CM

## 2015-01-31 DIAGNOSIS — I2510 Atherosclerotic heart disease of native coronary artery without angina pectoris: Secondary | ICD-10-CM

## 2015-01-31 DIAGNOSIS — Z952 Presence of prosthetic heart valve: Secondary | ICD-10-CM

## 2015-01-31 MED ORDER — NITROGLYCERIN 0.4 MG SL SUBL: 0.4000 mg | SUBLINGUAL_TABLET | SUBLINGUAL | Status: AC | PRN

## 2015-01-31 MED ORDER — LISINOPRIL 10 MG PO TABS: 10.0000 mg | ORAL_TABLET | Freq: Every day | ORAL | Status: DC

## 2015-01-31 NOTE — Progress Notes (Signed)
Cardiology Office Note  Date: 01/31/2015   ID: COADY TRAIN, DOB 1928/07/23, MRN [id number]  PCP: Toma Deiters, MD  Primary Cardiologist: Nona Dell, MD   Chief Complaint  Patient presents with  . Coronary Artery Disease  . Aortic valve disease  . Hypertension    History of Present Illness: [name] is an 81 y.o. male last seen in April. Interval visit with Dr. Excell Seltzer noted as well. Echocardiogram from May 2016 is outlined below with overall stable findings regarding TAVR. He is here today with his daughter for a follow-up visit. No major change in clinical status, although he did have a recent episode of angina after working outside in the heat, resolved with 2 nitroglycerin. This has not been a progressive pattern. Follow-up ECG today is stable.  Follow-up BMET reviewed, creatinine went up 2.4 and potassium 6.0. Recommended that lisinopril be cut back from 20 mg daily to 10 mg daily. Systolic blood pressure reportedly in the 140s to 150s at home. We reviewed his remaining regimen.  He reports stable NYHA class II dyspnea with typical ADLs.  Past Medical History  Diagnosis Date  . Coronary atherosclerosis of native coronary artery     Multivessel, LVEF 65%  . Mixed hyperlipidemia   . Essential hypertension, benign   . Myocardial infarction   . CKD (chronic kidney disease) stage 3, GFR 30-59 ml/min   . Arthritis   . Aortic stenosis   . 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 Umbilical hernia repair    . Appendectomy    . Left total knee arthroplasty  04  . Cataract extraction Left 04/27/13    Dr. Lita Mains  . Eye surgery    . Coronary artery bypass graft      LIMA to LAD, SVG to OM, SVG to RCA, SVG to diagonal 11/04  . Transcatheter aortic valve replacement, transfemoral N/A 11/17/2013      Procedure: TRANSCATHETER AORTIC VALVE REPLACEMENT, TRANSFEMORAL;  Surgeon: [name], MD;  Location: [facility] OR;  Service: Open Heart Surgery;  Laterality: N/A;  . Intraoperative transesophageal echocardiogram N/A 11/17/2013    Procedure: INTRAOPERATIVE TRANSESOPHAGEAL ECHOCARDIOGRAM;  Surgeon: [name], MD;  Location: [facility] OR;  Service: Open Heart Surgery;  Laterality: N/A;  . Left and right heart catheterization with coronary/graft angiogram N/A 10/13/2013    Procedure: LEFT AND RIGHT HEART CATHETERIZATION WITH Isabel Caprice;  Surgeon: Micheline Chapman, MD;  Location[facility] CATH LAB;  Service: Cardiovascular;  Laterality: N/A;    Current Outpatient Prescriptions  Medication Sig Dispense Refill  . acetaminophen (TYLENOL) 325 MG tablet Take 650 mg by mouth every 6 (six) hours as needed for mild pain.     Marland Kitchen aspirin 81 MG tablet Take 81 mg by mouth daily.      . famotidine (PEPCID) 20 MG tablet Take 20 mg by mouth 2 (two) times daily as needed for heartburn.     . furosemide (LASIX) 20 MG tablet Take 20 mg by mouth.    . hydrALAZINE (APRESOLINE) 50 MG tablet Take 1.5 tablets (75 mg total) by mouth 3 (three) times daily. 405 tablet 3  . isosorbide mononitrate (IMDUR) 30 MG 24 hr tablet Take 0.5 tablets (15 mg total) by mouth daily. 45 tablet 3  . metoprolol (LOPRESSOR) 50 MG tablet Take 1 tablet (50 mg  total) by mouth 2 (two) times daily. 180 tablet 3  . nitroGLYCERIN (NITROSTAT) 0.4 MG SL tablet Place 1 tablet (0.4 mg total) under the tongue every 5 (five) minutes as needed. 25 tablet 3  . Omega-3 Fatty Acids (FISH OIL) 1200 MG CAPS Take 1 capsule by mouth daily.     Marland Kitchen lisinopril (PRINIVIL,ZESTRIL) 10 MG tablet Take 1 tablet (10 mg total) by mouth daily. 90 tablet 3   No current facility-administered medications for this visit.    Allergies:  Lipitor   Social History: The patient  reports that he has never smoked. He has never used smokeless tobacco. He reports that he does not  drink alcohol or use illicit drugs.   ROS:  Please see the history of present illness. Otherwise, complete review of systems is positive for stable memory deficits.  All other systems are reviewed and negative.   Physical Exam: VS:  BP 154/60 mmHg  Pulse 50  Ht  (1.778 m)  Wt 177 lb (80.287 kg)  BMI 25.40 kg/m2  SpO2 97%, BMI Body mass index is 25.4 kg/(m^2).  Wt Readings from Last 3 Encounters:  01/31/15 177 lb (80.287 kg)  11/19/14 177 lb 12.8 oz (80.65 kg)  10/18/14 178 lb (80.74 kg)    Overweight male in no acute distress, hard of hearing.  HEENT: Conjunctiva and lids normal, oropharynx clear.  Neck: Supple, no elevated jugular venous pressure. No thyromegaly.  Lungs: Clear without labored breathing.  Cardiac: Regular rate and rhythm with 2/6 systolic murmur at the base, 1/6 diastolic murmur as well. Split S2. No S3 gallop.  Abdomen: Soft, nontender, no bruits. Bowel sounds present.  Skin: Warm and dry.  Extremities: Mild ankle edema. Distal pulses 2+.   ECG: ECG is ordered today and shows sinus bradycardia.   Recent Labwork:  01/27/2015: BUN 47, creatinine 2.4 (prior 2.2), potassium 6.0 (prior 5.4)  Other Studies Reviewed Today:  Echocardiogram 11/19/2014: Study Conclusions  - Left ventricle: The cavity size was normal. Systolic function was normal. The estimated ejection fraction was in the range of 60% to 65%. Wall motion was normal; there were no regional wall motion abnormalities. Features are consistent with a pseudonormal left ventricular filling pattern, with concomitant abnormal relaxation and increased filling pressure (grade 2 diastolic dysfunction). - Aortic valve: S/P TAVR with 26 mm Edwards Sapien XT AVR. There are 2 perivalvular jets that appear at least in the mildly  regurgitant range. There was no pressure halftime measurement obtained. Compared to prior study of 11/2013 there is no significant change. Mean gradient  (S): 11 mm Hg. - Mitral valve: Calcified annulus. There was mild regurgitation. - Left atrium: The atrium was moderately dilated. - Pulmonary arteries: PA peak pressure: 42 mm Hg (S).  Impressions:  - The right ventricular systolic pressure was increased consistent with moderate pulmonary hypertension.  Assessment and Plan:  1. Aortic stenosis status post TAVR (26 mm Edwards Sapien XT), symptomatically stable and with recent echocardiographic findings noted above. No significant change in gradient, stable perivalvular regurgitation in the mild range.  2. Essential hypertension. Continue current regimen with reduction in lisinopril to 10 mg daily as noted.  3. CKD, stage 3-4. Recent creatinine 2.4. Cutting back lisinopril. Encouraged adequate fluid intake as well.  4. Multivessel CAD status post CABG. Recent angina symptoms, ECG stable. Refill given for nitroglycerin. Continue to observe for any progression.  Current medicines were reviewed with the patient today.   Orders Placed This Encounter  Procedures  . Basic  metabolic panel  . EKG 12-Lead    Disposition: FU with me in 3 months.   Signed, Jonelle Sidle, MD, [facility] 01/31/2015 8:33 AM    [facility] at [facility] [address] [place], [place], [place] [address] Phone: [phone]; Fax: [phone]

## 2015-01-31 NOTE — Patient Instructions (Signed)
Your physician has recommended you make the following change in your medication:  Decrease your lisinopril to 10 mg daily. You may break your 20 mg tablet in half daily until they are finished. Continue all other medications the same. Your physician recommends that you return for lab work in 3 months just before your next visit to check your BMET. Your lab order was given to you today during your office visit. Your physician recommends that you schedule a follow-up appointment in: 3 months. You will receive a reminder letter in the mail in about 1 month reminding you to call and schedule your appointment. If you don't receive this letter, please contact our office.

## 2015-04-26 ENCOUNTER — Ambulatory Visit (INDEPENDENT_AMBULATORY_CARE_PROVIDER_SITE_OTHER): Payer: Medicare PPO | Admitting: Cardiology

## 2015-04-26 ENCOUNTER — Encounter: Payer: Self-pay | Admitting: *Deleted

## 2015-04-26 ENCOUNTER — Encounter: Payer: Self-pay | Admitting: Cardiology

## 2015-04-26 VITALS — BP 152/60 | HR 50 | Ht 70.0 in | Wt 180.0 lb

## 2015-04-26 DIAGNOSIS — I10 Essential (primary) hypertension: Secondary | ICD-10-CM | POA: Diagnosis not present

## 2015-04-26 DIAGNOSIS — Z954 Presence of other heart-valve replacement: Secondary | ICD-10-CM | POA: Diagnosis not present

## 2015-04-26 DIAGNOSIS — N183 Chronic kidney disease, stage 3 unspecified: Secondary | ICD-10-CM

## 2015-04-26 DIAGNOSIS — I25119 Atherosclerotic heart disease of native coronary artery with unspecified angina pectoris: Secondary | ICD-10-CM

## 2015-04-26 DIAGNOSIS — Z952 Presence of prosthetic heart valve: Secondary | ICD-10-CM

## 2015-04-26 NOTE — Patient Instructions (Signed)
Your physician recommends that you continue on your current medications as directed. Please refer to the Current Medication list given to you today. Your physician recommends that you return for lab work in 3 months just before your next visit in January 2017. Your physician recommends that you schedule a follow-up appointment in: 3 months.

## 2015-04-26 NOTE — Progress Notes (Signed)
Cardiology Office Note  Date: 04/26/2015   ID: JODECI ROARTY, DOB [birth year], MRN [id number]  PCP: Toma Deiters, MD  Primary Cardiologist: Nona Dell, MD   Chief Complaint  Patient presents with  . Coronary Artery Disease  . Status post TAVR    History of Present Illness: [name] is an 81 y.o. male last seen in August. He is here with his daughter for a follow-up visit. He reports occasional angina symptoms, uses nitroglycerin at times, no increasing frequency or severity however. He remains functional in ADLs, gets outside to walk with his daughter's dog.  He is somewhat unsteady on his feet, particularly if he stands up quickly, but he has not had any falls.  We reviewed his medications which are outlined below. At the last visit we cut back dose of lisinopril. He had follow-up BMET last week, results outlined below and overall stable.  He reports NYHA class 2-3 dyspnea at baseline, no worsening overall. No palpitations described, and no frank syncope.  Last ECG from August showed sinus bradycardia.  Echocardiogram from earlier this year is reviewed below.  Blood pressure control has been adequate, he reports generally systolics in the 140s to 150s.   Past Medical History  Diagnosis Date  . Coronary atherosclerosis of native coronary artery     Multivessel, LVEF 65%  . Mixed hyperlipidemia   . Essential hypertension, benign   . Myocardial infarction (HCC)   . CKD (chronic kidney disease) stage 3, GFR 30-59 ml/min   . Arthritis   . Aortic stenosis   . 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 Umbilical hernia repair    . Appendectomy    . Left total knee arthroplasty  04  . Cataract extraction Left 04/27/13    Dr. Lita Mains  . Eye surgery    . Coronary artery bypass graft      LIMA  to LAD, SVG to OM, SVG to RCA, SVG to diagonal 11/04  . Transcatheter aortic valve replacement, transfemoral N/A 11/17/2013    Procedure: TRANSCATHETER AORTIC VALVE REPLACEMENT, TRANSFEMORAL;  Surgeon: [name], MD;  Location: [facility] OR;  Service: Open Heart Surgery;  Laterality: N/A;  . Intraoperative transesophageal echocardiogram N/A 11/17/2013    Procedure: INTRAOPERATIVE TRANSESOPHAGEAL ECHOCARDIOGRAM;  Surgeon: [name], MD;  Location: [facility] OR;  Service: Open Heart Surgery;  Laterality: N/A;  . Left and right heart catheterization with coronary/graft angiogram N/A 10/13/2013    Procedure: LEFT AND RIGHT HEART CATHETERIZATION WITH Isabel Caprice;  Surgeon: Micheline Chapman, MD;  Location: [facility] CATH LAB;  Service: Cardiovascular;  Laterality: N/A;    Current Outpatient Prescriptions  Medication Sig Dispense Refill  . acetaminophen (TYLENOL) 325 MG tablet Take 650 mg by mouth every 6 (six) hours as needed for mild pain.     Marland Kitchen aspirin 81 MG tablet Take 81 mg by mouth daily.      . famotidine (PEPCID) 20 MG tablet Take 20 mg by mouth 2 (two) times daily as needed for heartburn.     . furosemide (LASIX) 20 MG tablet Take 20 mg by mouth.    . hydrALAZINE (APRESOLINE) 50 MG tablet Take 1.5 tablets (75 mg total) by mouth 3 (three) times daily. 405 tablet 3  . isosorbide mononitrate (IMDUR) 30 MG 24 hr tablet Take 0.5 tablets (15 mg  total) by mouth daily. 45 tablet 3  . lisinopril (PRINIVIL,ZESTRIL) 10 MG tablet Take 1 tablet (10 mg total) by mouth daily. 90 tablet 3  . metoprolol (LOPRESSOR) 50 MG tablet Take 1 tablet (50 mg total) by mouth 2 (two) times daily. 180 tablet 3  . nitroGLYCERIN (NITROSTAT) 0.4 MG SL tablet Place 1 tablet (0.4 mg total) under the tongue every 5 (five) minutes as needed. 25 tablet 3  . Omega-3 Fatty Acids (FISH OIL) 1200 MG CAPS Take 1 capsule by mouth daily.      No current facility-administered medications for this visit.    Allergies:  Lipitor    Social History: The patient  reports that he has never smoked. He has never used smokeless tobacco. He reports that he does not drink alcohol or use illicit drugs.   ROS:  Please see the history of present illness. Otherwise, complete review of systems is positive for decreased hearing.  Generally slower and some decline in memory. All other systems are reviewed and negative.   Physical Exam: VS:  BP 152/60 mmHg  Pulse 50  Ht 5\' 10"  (1.778 m)  Wt 180 lb (81.647 kg)  BMI 25.83 kg/m2  SpO2 98%, BMI Body mass index is 25.83 kg/(m^2).  Wt Readings from Last 3 Encounters:  04/26/15 180 lb (81.647 kg)  01/31/15 177 lb (80.287 kg)  11/19/14 177 lb 12.8 oz (80.65 kg)     Elderly male, hard of hearing.  Appears comfortable at rest. HEENT: Conjunctiva and lids normal, oropharynx clear.  Neck: Supple, no elevated jugular venous pressure. No thyromegaly.  Lungs: Clear without labored breathing.  Cardiac: Regular rate and rhythm with 2/6 systolic murmur at the base, 1/6 diastolic murmur as well. Split S2. No S3 gallop.  Abdomen: Soft, nontender, no bruits. Bowel sounds present.  Skin: Warm and dry.  Extremities: Mild ankle edema. Distal pulses 2+.  Musculoskeletal: No kyphosis. Neuropsychiatric: Alert and oriented 3, decreased hearing.   ECG: ECG is not ordered today.   Recent Labwork:  October 2016: BUN 41, creatinine 2.4, sodium 142, potassium 5.4  Other Studies Reviewed Today:  Echocardiogram 11/19/2014: Study Conclusions  - Left ventricle: The cavity size was normal. Systolic function was normal. The estimated ejection fraction was in the range of 60% to 65%. Wall motion was normal; there were no regional wall motion abnormalities. Features are consistent with a pseudonormal left ventricular filling pattern, with concomitant abnormal relaxation and increased filling pressure (grade 2 diastolic dysfunction). - Aortic valve: S/P TAVR with 26 mm Edwards  Sapien XT AVR. There are 2 perivalvular jets that appear at least in the mildly  regurgitant range. There was no pressure halftime measurement obtained. Compared to prior study of 11/2013 there is no significant change. Mean gradient (S): 11 mm Hg. - Mitral valve: Calcified annulus. There was mild regurgitation. - Left atrium: The atrium was moderately dilated. - Pulmonary arteries: PA peak pressure: 42 mm Hg (S).  Impressions:  - The right ventricular systolic pressure was increased consistent with moderate pulmonary hypertension.  Assessment and Plan:  1. Aortic stenosis status post TAVR (26 mm Edwards Sapien XT) in May 2015. He continues to do well overall, follow-up echocardiogram outlined above with stable perivalvular regurgitation of mild degree.  We continue observation.  2. Multivessel CAD status post CABG. Intermittent angina symptoms with nitroglycerin use, stable overall. Cardiac catheterization in April 2015 revealed patent LIMA to LAD, patent SVG to diagonal, patent SVG to distal RCA, and occluded SVG to obtuse marginal.  3. CKD stage 3, recent creatinine 2.4 stable.  4. Essential hypertension, no changes made to current regimen. Goal systolic has been around 150.  Current medicines were reviewed with the patient today.   Orders Placed This Encounter  Procedures  . Basic metabolic panel    Disposition: FU with me in 3 months.   Signed, Jonelle Sidle, MD, [facility] 04/26/2015 8:27 AM    [facility] at [facility] [address] [place], [place], [place] [address] Phone: [phone]; Fax: [phone]

## 2015-05-02 ENCOUNTER — Ambulatory Visit: Payer: Medicare PPO | Admitting: Cardiology

## 2015-07-14 ENCOUNTER — Encounter: Payer: Self-pay | Admitting: *Deleted

## 2015-07-19 ENCOUNTER — Telehealth: Payer: Self-pay | Admitting: *Deleted

## 2015-07-19 NOTE — Telephone Encounter (Signed)
-----   Message from Samuel G McDowell, MD sent at 07/14/2015 12:35 PM EST ----- Reviewed. Creatinine is stable at 2.4, potassium 5.6. No changes to current regimen. 

## 2015-07-19 NOTE — Telephone Encounter (Signed)
Daughter informed

## 2015-07-19 NOTE — Telephone Encounter (Signed)
-----   Message from Jonelle Sidle, MD sent at 07/14/2015 12:35 PM EST ----- Reviewed. Creatinine is stable at 2.4, potassium 5.6. No changes to current regimen.

## 2015-07-21 ENCOUNTER — Ambulatory Visit (INDEPENDENT_AMBULATORY_CARE_PROVIDER_SITE_OTHER): Payer: Medicare PPO | Admitting: Cardiology

## 2015-07-21 ENCOUNTER — Encounter: Payer: Self-pay | Admitting: Cardiology

## 2015-07-21 VITALS — BP 122/74 | HR 53 | Ht 68.0 in | Wt 182.0 lb

## 2015-07-21 DIAGNOSIS — I350 Nonrheumatic aortic (valve) stenosis: Secondary | ICD-10-CM

## 2015-07-21 DIAGNOSIS — Z954 Presence of other heart-valve replacement: Secondary | ICD-10-CM

## 2015-07-21 DIAGNOSIS — N183 Chronic kidney disease, stage 3 (moderate): Secondary | ICD-10-CM

## 2015-07-21 DIAGNOSIS — I2510 Atherosclerotic heart disease of native coronary artery without angina pectoris: Secondary | ICD-10-CM

## 2015-07-21 DIAGNOSIS — Z952 Presence of prosthetic heart valve: Secondary | ICD-10-CM

## 2015-07-21 NOTE — Progress Notes (Signed)
Cardiology Office Note  Date: 07/21/2015   ID: [name], DOB [birth year], MRN [id number]  PCP: Toma Deiters, MD  Primary Cardiologist: Nona Dell, MD   Chief Complaint  Patient presents with  . Coronary Artery Disease  . Status post TAVR    History of Present Illness: [name] is an 82 y.o. male last seen in October 2016.  He is here today with his daughter for a follow-up visit.  There have been no major changes since I last saw him, continues with same level of stamina, has not been walking quite as much in the cold weather. He reports NYHA class II dyspnea, no increasing requirement for nitroglycerin.  His weight is up only 2 pounds compared to the last visit. He denies any orthopnea or PND.  We reviewed his medications which are outlined below. There have been no significant changes. His blood pressure is well controlled today. He is bradycardic, but not clearly symptomatic with this.  Past Medical History  Diagnosis Date  . Coronary atherosclerosis of native coronary artery     Multivessel, LVEF 65%  . Mixed hyperlipidemia   . Essential hypertension, benign   . Myocardial infarction (HCC)   . CKD (chronic kidney disease) stage 3, GFR 30-59 ml/min   . Arthritis   . Aortic stenosis   . 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 acetaminophen (TYLENOL) 325 MG tablet Take 650 mg by mouth every 6 (six) hours as needed for mild pain.     Marland Kitchen aspirin 81 MG tablet Take 81 mg by mouth daily.      . famotidine (PEPCID) 20 MG tablet Take 20 mg by mouth 2 (two) times daily as needed for heartburn.     . furosemide (LASIX) 20 MG tablet Take 20 mg by mouth.    . hydrALAZINE (APRESOLINE) 50 MG tablet Take 1.5 tablets (75 mg total) by mouth 3 (three) times daily. 405 tablet 3  .  isosorbide mononitrate (IMDUR) 30 MG 24 hr tablet Take 0.5 tablets (15 mg total) by mouth daily. 45 tablet 3  . lisinopril (PRINIVIL,ZESTRIL) 10 MG tablet Take 1 tablet (10 mg total) by mouth daily. 90 tablet 3  . metoprolol (LOPRESSOR) 50 MG tablet Take 1 tablet (50 mg total) by mouth 2 (two) times daily. 180 tablet 3  . nitroGLYCERIN (NITROSTAT) 0.4 MG SL tablet Place 1 tablet (0.4 mg total) under the tongue every 5 (five) minutes as needed. 25 tablet 3  . Omega-3 Fatty Acids (FISH OIL) 1200 MG CAPS Take 1 capsule by mouth daily.      No current facility-administered medications for this visit.   Allergies:  Lipitor   Social History: The patient  reports that he has never smoked. He has never used smokeless tobacco. He reports that he does not drink alcohol or use illicit drugs.   ROS:  Please see the history of present illness. Otherwise, complete review of systems is positive for decreased hearing.  All other systems are reviewed and negative.   Physical Exam: VS:  BP 122/74 mmHg  Pulse 53  Ht  (1.727 m)  Wt 182 lb (82.555 kg)  BMI 27.68 kg/m2  SpO2 98%, BMI Body mass index is 27.68 kg/(m^2).  Wt Readings from Last 3 Encounters:  07/21/15 182 lb (82.555 kg)  04/26/15 180 lb (81.647 kg)  01/31/15 177 lb (80.287 kg)    Elderly male, hard of hearing. Appears comfortable at rest. HEENT: Conjunctiva and lids normal, oropharynx clear.  Neck: Supple, no elevated jugular venous pressure. No thyromegaly.  Lungs: Clear without labored breathing.  Cardiac: Regular rate and rhythm with 2/6 systolic murmur at the base, 1/6 diastolic murmur as well. Split S2. No S3 gallop.  Abdomen: Soft, nontender, no bruits. Bowel sounds present.  Skin: Warm and dry.  Extremities: Mild ankle edema. Distal pulses 2+.   ECG: Tracing from 01/31/2015 showed sinus bradycardia.  Recent Labwork:  January 2017: BUN 44, creatinine 2.4, potassium 5.6  Other Studies Reviewed Today:  Echocardiogram  11/19/2014: Study Conclusions  - Left ventricle: The cavity size was normal. Systolic function was normal. The estimated ejection fraction was in the range of 60% to 65%. Wall motion was normal; there were no regional wall motion abnormalities. Features are consistent with a pseudonormal left ventricular filling pattern, with concomitant abnormal relaxation and increased filling pressure (grade 2 diastolic dysfunction). - Aortic valve: S/P TAVR with 26 mm Edwards Sapien XT AVR. There are 2 perivalvular jets that appear at least in the mildly  regurgitant range. There was no pressure halftime measurement obtained. Compared to prior study of 11/2013 there is no significant change. Mean gradient (S): 11 mm Hg. - Mitral valve: Calcified annulus. There was mild regurgitation. - Left atrium: The atrium was moderately dilated. - Pulmonary arteries: PA peak pressure: 42 mm Hg (S).  Impressions:  - The right ventricular systolic pressure was increased consistent with moderate pulmonary hypertension.  Assessment and Plan:  1. Aortic stenosis status post TAVR (26 mm Edwards Sapien XT) in May 2015.  He is doing well clinically on current medical regimen. No changes were made today. We will obtain a follow-up echocardiogram for his next visit in May as well as a BMET at that time. I have encouraged him to continue walking for exercise as tolerated.  2. Multivessel CAD status post CABG, no progressing angina symptoms on medical therapy.  He has graft disease with an occluded SVG to the obtuse marginal.  3. CKD stage 3, recent follow-up creatinine 2.4.  Current medicines were reviewed with the patient today.   Orders Placed This Encounter  Procedures  . Basic metabolic panel  . ECHOCARDIOGRAM COMPLETE    Disposition: FU with me in May.   Signed, Jonelle Sidle, MD, [facility] 07/21/2015 8:19 AM    [facility] at [facility] [address] [place],  [place], [place] [address] Phone: [phone]; Fax: [phone]

## 2015-07-21 NOTE — Patient Instructions (Signed)
Your physician recommends that you continue on your current medications as directed. Please refer to the Current Medication list given to you today. Your physician recommends that you return for lab work in 4 months just before your next visit to check your BMET. Your physician has requested that you have an echocardiogram in May 2017 just before your next visit. Echocardiography is a painless test that uses sound waves to create images of your heart. It provides your doctor with information about the size and shape of your heart and how well your heart's chambers and valves are working. This procedure takes approximately one hour. There are no restrictions for this procedure. Your physician recommends that you schedule a follow-up appointment in: 4 months.

## 2015-10-06 ENCOUNTER — Other Ambulatory Visit: Payer: Self-pay | Admitting: Cardiology

## 2015-10-20 ENCOUNTER — Other Ambulatory Visit: Payer: Self-pay

## 2015-10-20 ENCOUNTER — Ambulatory Visit (INDEPENDENT_AMBULATORY_CARE_PROVIDER_SITE_OTHER): Payer: Medicare PPO

## 2015-10-20 DIAGNOSIS — I2510 Atherosclerotic heart disease of native coronary artery without angina pectoris: Secondary | ICD-10-CM

## 2015-10-20 DIAGNOSIS — I350 Nonrheumatic aortic (valve) stenosis: Secondary | ICD-10-CM

## 2015-10-21 ENCOUNTER — Telehealth: Payer: Self-pay | Admitting: *Deleted

## 2015-10-21 NOTE — Telephone Encounter (Signed)
Wife informed

## 2015-10-21 NOTE — Telephone Encounter (Signed)
-----   Message from Jonelle SidleSamuel G McDowell, MD sent at 10/20/2015 11:16 AM EDT ----- Report reviewed. Stable LVEF. TAVR also looks to be stable with mild aortic regurgitation as before. Continue with current plan.

## 2015-11-10 ENCOUNTER — Telehealth: Payer: Self-pay | Admitting: *Deleted

## 2015-11-10 NOTE — Telephone Encounter (Signed)
-----   Message from Jonelle SidleSamuel G McDowell, MD sent at 11/09/2015 10:44 AM EDT ----- Reviewed results. Overall stable findings with creatinine 2.4 and potassium 5.3.

## 2015-11-10 NOTE — Telephone Encounter (Signed)
Patient informed. 

## 2015-11-15 ENCOUNTER — Encounter: Payer: Self-pay | Admitting: Cardiology

## 2015-11-15 ENCOUNTER — Ambulatory Visit (INDEPENDENT_AMBULATORY_CARE_PROVIDER_SITE_OTHER): Payer: Medicare PPO | Admitting: Cardiology

## 2015-11-15 VITALS — BP 124/54 | HR 52 | Ht 68.0 in | Wt 181.0 lb

## 2015-11-15 DIAGNOSIS — N183 Chronic kidney disease, stage 3 (moderate): Secondary | ICD-10-CM

## 2015-11-15 DIAGNOSIS — I5032 Chronic diastolic (congestive) heart failure: Secondary | ICD-10-CM | POA: Diagnosis not present

## 2015-11-15 DIAGNOSIS — Z954 Presence of other heart-valve replacement: Secondary | ICD-10-CM

## 2015-11-15 DIAGNOSIS — Z952 Presence of prosthetic heart valve: Secondary | ICD-10-CM

## 2015-11-15 DIAGNOSIS — I2510 Atherosclerotic heart disease of native coronary artery without angina pectoris: Secondary | ICD-10-CM

## 2015-11-15 DIAGNOSIS — I10 Essential (primary) hypertension: Secondary | ICD-10-CM

## 2015-11-15 MED ORDER — ISOSORBIDE MONONITRATE ER 30 MG PO TB24: 15.0000 mg | ORAL_TABLET | Freq: Every day | ORAL | Status: DC

## 2015-11-15 NOTE — Progress Notes (Signed)
Cardiology Office Note  Date: 11/15/2015   ID: [name], DOB April 18, 1929, MRN [id number]  PCP: Toma DeitersXAJE A HASANAJ, MD  Primary Cardiologist: Nona DellSamuel Adam Demary, MD   Chief Complaint  Patient presents with  . Coronary Artery Disease  . Status post TAVR    History of Present Illness: [name] is an 82 y.o. male last seen in January. He presents with his daughter for a routine follow-up visit. No major change in stamina, no chest pain or progressive dyspnea on exertion. He does have to pace himself. In talking with him, it seems to me that he has had a general decline in mental status over the months, has difficulty recalling details sometimes. This is also contributed to by his decreased hearing.  I reviewed his medications which are outlined below. There is been no significant change in diuretic regimen. His weight is different by only 1 pound compared to last visit. He reports no orthopnea or leg edema.  Recent lab work is outlined below showing stable renal insufficiency.  Follow-up echocardiogram in April showed normal LVEF with moderate diastolic dysfunction and stable TAVR.   Past Medical History  Diagnosis Date  . Coronary atherosclerosis of native coronary artery     Multivessel, LVEF 65%  . Mixed hyperlipidemia   . Essential hypertension, benign   . Myocardial infarction (HCC)   . CKD (chronic kidney disease) stage 3, GFR 30-59 ml/min   . Arthritis   . Aortic stenosis   . 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 Umbilical hernia repair    . Appendectomy    . Left total knee arthroplasty  04  . Cataract extraction Left 04/27/13    Dr. Lita MainsHaines  . Eye surgery    . Coronary artery bypass graft      LIMA to LAD, SVG to OM, SVG to RCA, SVG to diagonal 11/04  . Transcatheter aortic valve  replacement, transfemoral N/A 11/17/2013    Procedure: TRANSCATHETER AORTIC VALVE REPLACEMENT, TRANSFEMORAL;  Surgeon: Tonny BollmanMichael Cooper, MD;  [facility];  Service: Open Heart Surgery;  Laterality: N/A;  . Intraoperative transesophageal echocardiogram N/A 11/17/2013    Procedure: INTRAOPERATIVE TRANSESOPHAGEAL ECHOCARDIOGRAM;  Surgeon: Tonny BollmanMichael Cooper, MD;  [facility];  Service: Open Heart Surgery;  Laterality: N/A;  . Left and right heart catheterization with coronary/graft angiogram N/A 10/13/2013    Procedure: LEFT AND RIGHT HEART CATHETERIZATION WITH Isabel CapriceORONARY/GRAFT ANGIOGRAM;  Surgeon: Micheline ChapmanMichael D Cooper, MD;  [facility];  Service: Cardiovascular;  Laterality: N/A;    Current Outpatient Prescriptions  Medication Sig Dispense Refill  . acetaminophen (TYLENOL) 325 MG tablet Take 650 mg by mouth every 6 (six) hours as needed for mild pain.     Marland Kitchen. aspirin 81 MG tablet Take 81 mg by mouth daily.      . famotidine (PEPCID) 20 MG tablet Take 20 mg by mouth 2 (two) times daily as needed for heartburn.     . furosemide (LASIX) 20 MG tablet Take 20 mg by mouth.    . hydrALAZINE (APRESOLINE) 50 MG tablet Take 1.5 tablets (75 mg total) by mouth 3 (three) times daily. 405 tablet 3  . lisinopril (PRINIVIL,ZESTRIL) 10 MG tablet Take 1 tablet (10 mg total) by mouth daily. 90 tablet 3  . metoprolol (LOPRESSOR) 50 MG tablet TAKE ONE TABLET BY  MOUTH TWICE DAILY 180 tablet 3  . nitroGLYCERIN (NITROSTAT) 0.4 MG SL tablet Place 1 tablet (0.4 mg total) under the tongue every 5 (five) minutes as needed. 25 tablet 3  . Omega-3 Fatty Acids (FISH OIL) 1200 MG CAPS Take 1 capsule by mouth daily.     . isosorbide mononitrate (IMDUR) 30 MG 24 hr tablet Take 0.5 tablets (15 mg total) by mouth daily. 45 tablet 3   No current facility-administered medications for this visit.   Allergies:  Lipitor   Social History: The patient  reports that he has never smoked. He has never used smokeless tobacco. He reports  that he does not drink alcohol or use illicit drugs.   ROS:  Please see the history of present illness. Otherwise, complete review of systems is positive for decreased hearing.  All other systems are reviewed and negative.   Physical Exam: VS:  BP 124/54 mmHg  Pulse 52  Ht  (1.727 m)  Wt 181 lb (82.101 kg)  BMI 27.53 kg/m2  SpO2 98%, BMI Body mass index is 27.53 kg/(m^2).  Wt Readings from Last 3 Encounters:  11/15/15 181 lb (82.101 kg)  07/21/15 182 lb (82.555 kg)  04/26/15 180 lb (81.647 kg)    Elderly male, hard of hearing. Appears comfortable at rest. HEENT: Conjunctiva and lids normal, oropharynx clear.  Neck: Supple, no elevated jugular venous pressure. No thyromegaly.  Lungs: Clear without labored breathing.  Cardiac: Regular rate and rhythm with 2/6 systolic murmur at the base, 1/6 diastolic murmur as well. Split S2. No S3 gallop.  Abdomen: Soft, nontender, no bruits. Bowel sounds present.  Skin: Warm and dry.  Extremities: Mild ankle edema. Distal pulses 2+.   ECG: I personally reviewed the prior tracing from 01/31/2015 which showed sinus bradycardia.  Recent Labwork:  May 2017: BUN 55, creatinine 2.4, potassium 5.3  Other Studies Reviewed Today:  Echocardiogram 10/20/2015: Study Conclusions  - Left ventricle: The cavity size was normal. Wall thickness was  increased in a pattern of moderate LVH. Systolic function was  normal. The estimated ejection fraction was in the range of 60%  to 65%. Wall motion was normal; there were no regional wall  motion abnormalities. Features are consistent with a pseudonormal  left ventricular filling pattern, with concomitant abnormal  relaxation and increased filling pressure (grade 2 diastolic  dysfunction). - Aortic valve: S/P TAVR with 26 mm Edwards Sapien XT AVR. There  was mild regurgitation. - Mitral valve: Calcified annulus. Mildly thickened leaflets .  There was mild regurgitation. - Left atrium:  The atrium was moderately dilated. - Tricuspid valve: There was mild regurgitation. - Pulmonic valve: There was mild regurgitation.  Assessment and Plan:  1. History of aortic stenosis status post TAVR. Valve function stable by recent echocardiogram.  2. Multivessel CAD status post CABG, no active angina symptoms at this time. Continue medical therapy and observation.  3. Chronic diastolic heart failure, weight is stable as well as symptoms. No change in current diuretic regimen.  4. CKD stage III, creatinine stable at 2.4. Follow-up BMET for next visit.  5. Essential hypertension, blood pressure is adequately controlled today.  Current medicines were reviewed with the patient today.  Disposition: FU with me in 6 months.   Signed, Jonelle Sidle, MD, [facility] 11/15/2015 8:21 AM    [facility] at [facility] [address] [place], [place], [place] [address] Phone: [phone]; Fax: [phone]

## 2015-11-15 NOTE — Patient Instructions (Signed)
Your physician recommends that you continue on your current medications as directed. Please refer to the Current Medication list given to you today. Your physician recommends that you return for lab work in: 6 months just before your next visit to check your BMET. We will contact you when this is due. Your physician recommends that you schedule a follow-up appointment in: 6 months. You will receive a reminder letter in the mail in about 4 months reminding you to call and schedule your appointment. If you don't receive this letter, please contact our office.

## 2016-01-06 ENCOUNTER — Other Ambulatory Visit: Payer: Self-pay | Admitting: Cardiology

## 2016-02-10 ENCOUNTER — Other Ambulatory Visit: Payer: Self-pay | Admitting: Cardiology

## 2016-04-18 ENCOUNTER — Encounter: Payer: Self-pay | Admitting: *Deleted

## 2016-04-18 ENCOUNTER — Other Ambulatory Visit: Payer: Self-pay | Admitting: *Deleted

## 2016-04-18 DIAGNOSIS — I10 Essential (primary) hypertension: Secondary | ICD-10-CM

## 2016-04-18 DIAGNOSIS — N184 Chronic kidney disease, stage 4 (severe): Secondary | ICD-10-CM

## 2016-05-05 ENCOUNTER — Emergency Department (HOSPITAL_COMMUNITY): Admission: EM | Admit: 2016-05-05 | Discharge: 2016-05-05 | Discharge status: Absent without leave | Payer: Medicare PPO

## 2016-05-05 ENCOUNTER — Inpatient Hospital Stay (HOSPITAL_COMMUNITY)
Admission: EM | Admit: 2016-05-05 | Discharge: 2016-05-08 | DRG: 682 | Disposition: A | Discharge status: Skilled Nursing Facility | Payer: Medicare PPO | Source: Other Acute Inpatient Hospital | Attending: Internal Medicine | Admitting: Internal Medicine

## 2016-05-05 ENCOUNTER — Encounter (HOSPITAL_COMMUNITY): Payer: Self-pay | Admitting: *Deleted

## 2016-05-05 DIAGNOSIS — Z9842 Cataract extraction status, left eye: Secondary | ICD-10-CM | POA: Diagnosis not present

## 2016-05-05 DIAGNOSIS — S42202A Unspecified fracture of upper end of left humerus, initial encounter for closed fracture: Secondary | ICD-10-CM | POA: Diagnosis present

## 2016-05-05 DIAGNOSIS — I4891 Unspecified atrial fibrillation: Secondary | ICD-10-CM | POA: Diagnosis present

## 2016-05-05 DIAGNOSIS — Z952 Presence of prosthetic heart valve: Secondary | ICD-10-CM

## 2016-05-05 DIAGNOSIS — I480 Paroxysmal atrial fibrillation: Secondary | ICD-10-CM | POA: Diagnosis not present

## 2016-05-05 DIAGNOSIS — E872 Acidosis: Secondary | ICD-10-CM | POA: Diagnosis present

## 2016-05-05 DIAGNOSIS — I2510 Atherosclerotic heart disease of native coronary artery without angina pectoris: Secondary | ICD-10-CM | POA: Diagnosis present

## 2016-05-05 DIAGNOSIS — D631 Anemia in chronic kidney disease: Secondary | ICD-10-CM | POA: Diagnosis present

## 2016-05-05 DIAGNOSIS — E875 Hyperkalemia: Secondary | ICD-10-CM | POA: Diagnosis present

## 2016-05-05 DIAGNOSIS — R2689 Other abnormalities of gait and mobility: Secondary | ICD-10-CM

## 2016-05-05 DIAGNOSIS — H9190 Unspecified hearing loss, unspecified ear: Secondary | ICD-10-CM | POA: Diagnosis present

## 2016-05-05 DIAGNOSIS — I129 Hypertensive chronic kidney disease with stage 1 through stage 4 chronic kidney disease, or unspecified chronic kidney disease: Secondary | ICD-10-CM | POA: Diagnosis present

## 2016-05-05 DIAGNOSIS — S42301A Unspecified fracture of shaft of humerus, right arm, initial encounter for closed fracture: Secondary | ICD-10-CM | POA: Diagnosis present

## 2016-05-05 DIAGNOSIS — E782 Mixed hyperlipidemia: Secondary | ICD-10-CM | POA: Diagnosis present

## 2016-05-05 DIAGNOSIS — D649 Anemia, unspecified: Secondary | ICD-10-CM | POA: Diagnosis present

## 2016-05-05 DIAGNOSIS — I10 Essential (primary) hypertension: Secondary | ICD-10-CM | POA: Diagnosis present

## 2016-05-05 DIAGNOSIS — I21A1 Myocardial infarction type 2: Secondary | ICD-10-CM | POA: Diagnosis present

## 2016-05-05 DIAGNOSIS — N179 Acute kidney failure, unspecified: Principal | ICD-10-CM | POA: Diagnosis present

## 2016-05-05 DIAGNOSIS — Z951 Presence of aortocoronary bypass graft: Secondary | ICD-10-CM

## 2016-05-05 DIAGNOSIS — I350 Nonrheumatic aortic (valve) stenosis: Secondary | ICD-10-CM

## 2016-05-05 DIAGNOSIS — R001 Bradycardia, unspecified: Secondary | ICD-10-CM | POA: Diagnosis present

## 2016-05-05 DIAGNOSIS — M25611 Stiffness of right shoulder, not elsewhere classified: Secondary | ICD-10-CM

## 2016-05-05 DIAGNOSIS — N184 Chronic kidney disease, stage 4 (severe): Secondary | ICD-10-CM | POA: Diagnosis present

## 2016-05-05 DIAGNOSIS — M25612 Stiffness of left shoulder, not elsewhere classified: Secondary | ICD-10-CM

## 2016-05-05 LAB — PROTIME-INR
INR: 1.15
PROTHROMBIN TIME: 14.8 s (ref 11.4–15.2)

## 2016-05-05 LAB — COMPREHENSIVE METABOLIC PANEL
ALT: 15 U/L — ABNORMAL LOW (ref 17–63)
ANION GAP: 6 (ref 5–15)
AST: 19 U/L (ref 15–41)
Albumin: 2.4 g/dL — ABNORMAL LOW (ref 3.5–5.0)
Alkaline Phosphatase: 45 U/L (ref 38–126)
BILIRUBIN TOTAL: 0.8 mg/dL (ref 0.3–1.2)
BUN: 64 mg/dL — AB (ref 6–20)
CO2: 19 mmol/L — ABNORMAL LOW (ref 22–32)
Calcium: 8 mg/dL — ABNORMAL LOW (ref 8.9–10.3)
Chloride: 109 mmol/L (ref 101–111)
Creatinine, Ser: 2.65 mg/dL — ABNORMAL HIGH (ref 0.61–1.24)
GFR calc non Af Amer: 20 mL/min — ABNORMAL LOW (ref >60)
GFR, EST AFRICAN AMERICAN: 24 mL/min — AB (ref >60)
Glucose, Bld: 149 mg/dL — ABNORMAL HIGH (ref 65–99)
POTASSIUM: 4.7 mmol/L (ref 3.5–5.1)
Sodium: 134 mmol/L — ABNORMAL LOW (ref 135–145)
TOTAL PROTEIN: 5 g/dL — AB (ref 6.5–8.1)

## 2016-05-05 LAB — CBC
HCT: 24.9 % — ABNORMAL LOW (ref 39.0–52.0)
HEMOGLOBIN: 8.3 g/dL — AB (ref 13.0–17.0)
MCH: 31 pg (ref 26.0–34.0)
MCHC: 33.3 g/dL (ref 30.0–36.0)
MCV: 92.9 fL (ref 78.0–100.0)
Platelets: 190 10*3/uL (ref 150–400)
RBC: 2.68 MIL/uL — ABNORMAL LOW (ref 4.22–5.81)
RDW: 14.3 % (ref 11.5–15.5)
WBC: 6.5 10*3/uL (ref 4.0–10.5)

## 2016-05-05 LAB — MAGNESIUM: MAGNESIUM: 2.3 mg/dL (ref 1.7–2.4)

## 2016-05-05 LAB — PHOSPHORUS: PHOSPHORUS: 3.2 mg/dL (ref 2.5–4.6)

## 2016-05-05 LAB — LACTIC ACID, PLASMA: Lactic Acid, Venous: 1 mmol/L (ref 0.5–1.9)

## 2016-05-05 MED ORDER — ATORVASTATIN CALCIUM 40 MG PO TABS: 40.0000 mg | ORAL_TABLET | Freq: Every day | ORAL | Status: DC

## 2016-05-05 MED ORDER — ASPIRIN EC 81 MG PO TBEC
81.0000 mg | DELAYED_RELEASE_TABLET | Freq: Every day | ORAL | Status: DC
  Administered 2016-05-06 – 2016-05-07 (×2): 81 mg via ORAL
  Filled 2016-05-05 (×2): qty 1

## 2016-05-05 MED ORDER — ACETAMINOPHEN 325 MG PO TABS
650.0000 mg | ORAL_TABLET | Freq: Three times a day (TID) | ORAL | Status: DC
  Administered 2016-05-05 – 2016-05-07 (×5): 650 mg via ORAL
  Filled 2016-05-05 (×5): qty 2

## 2016-05-05 MED ORDER — QUETIAPINE FUMARATE 25 MG PO TABS: 25.0000 mg | ORAL_TABLET | Freq: Every day | ORAL | Status: DC

## 2016-05-05 MED ORDER — SODIUM BICARBONATE 650 MG PO TABS
650.0000 mg | ORAL_TABLET | Freq: Three times a day (TID) | ORAL | Status: DC
Start: 2016-05-05 — End: 2016-05-08
  Administered 2016-05-05 – 2016-05-08 (×9): 650 mg via ORAL
  Filled 2016-05-05 (×9): qty 1

## 2016-05-05 MED ORDER — ISOSORBIDE MONONITRATE ER 30 MG PO TB24
15.0000 mg | ORAL_TABLET | Freq: Every day | ORAL | Status: DC
  Administered 2016-05-06 – 2016-05-08 (×3): 15 mg via ORAL
  Filled 2016-05-05 (×3): qty 1

## 2016-05-05 MED ORDER — ENOXAPARIN SODIUM 30 MG/0.3ML ~~LOC~~ SOLN
30.0000 mg | SUBCUTANEOUS | Status: DC
  Administered 2016-05-05 – 2016-05-07 (×3): 30 mg via SUBCUTANEOUS
  Filled 2016-05-05 (×3): qty 0.3

## 2016-05-05 MED ORDER — METOPROLOL TARTRATE 50 MG PO TABS
50.0000 mg | ORAL_TABLET | Freq: Two times a day (BID) | ORAL | Status: DC
  Administered 2016-05-05 – 2016-05-06 (×2): 50 mg via ORAL
  Filled 2016-05-05 (×2): qty 1

## 2016-05-05 NOTE — H&P (Signed)
History and Physical   Lessie DingsJames H Nearhood ZOX:[id number] DOB: 1928-11-26 DOA: 05/05/2016  PCP: Toma DeitersXAJE A HASANAJ, MD  Chief Complaint: Fall  HPI:  Mr. Cleone SlimCundiff is an 82 year old Caucasian male with past medical history significant for essential hypertension, CAD, hyperlipidemia, CKD stage V (not on HD). Pt was [facility] to [facility] in [place] on 04/25/2016. Patient initially presented after sustaining a mechanical fall at home and presented with bilateral arm pain. X-rays in the emergency department revealed bilateral humerus fractures. There was no head pain, neck pain, or head trauma. Ortho was consulted and patient was managed with a non-operative approach. Hospital course was complicated by type II MI. Cardiology was consulted and believed the troponin elevation was secondary to demand ischemia and recommended medical management with aspirin, metoprolol tartrate, and Imdur. They did not plan additional cardiac testing during the hospitalization. Patient was also noted to have atrial flutter fibrillation with RVR was rate controlled with beta blockade. Patient also experienced AKI in the setting of CKD. Laboratory demonstrated non-anion gap metabolic acidosis and hyperkalemia (5.2-5.3). Nephrology was consulted to help manage metabolic disturbances. Oral bicarbonate was ordered. Patient did not require hemodialysis. According to sign out from Dr. Lisabeth RegisterLangleland, patient also experienced delirium in the inpatient setting and antipsychotic medication was initiated. During my interview, patient is is accompanied by his wife, daughter, and granddaughter. Patient states he has mild pain in his upper extremities but is overall fairly comfortable. He denies any chest pain, shortness of breath, or neck pain. He states he is hungry and wants to eat dinner. Patient denies any fever chills. Apparently, patient was already accepted to a skilled nursing facility for rehabilitation, but family requested [facility].  Review of Systems: A complete ROS was obtained; pertinent positives negatives are denoted in the HPI. Otherwise, all systems are negative.   Past Medical History:  Diagnosis Date  . Aortic stenosis   . Arthritis   . CKD (chronic kidney disease) stage 3, GFR 30-59 ml/min   . Coronary atherosclerosis of native coronary artery    Multivessel, LVEF 65%  . Essential hypertension, benign   . Mixed hyperlipidemia   . Myocardial infarction   . 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 Marital status: Married    Spouse name: N/A  . Number of children: N/A  . Years of education: N/A   Occupational History  . Not on file.   Social History Main Topics  . Smoking status: Never Smoker  . Smokeless tobacco: Never Used     Comment: tobacco use - no  . Alcohol use No  . Drug use: No  . Sexual activity: Not on file   Other Topics Concern  . Not on file   Social History Narrative   Married.    Family History  Problem Relation Age of Onset  . Cirrhosis Father     Physical Exam: Vitals:   05/05/16 1927  BP: (!) 175/60  Pulse: 62  Resp: 18  Temp: 98.2 F (36.8 C)  TempSrc: Oral  SpO2: 98%  Weight: 85.5 kg (188 lb 7.9 oz)  Height: 5\' 10"  (1.778 m)   General: Appears calm and comfortable. Appears chronically ill.  ENT: Grossly normal hearing, MMM. Cardiovascular: RRR. No M/R/G. No LE edema.  Respiratory: CTA bilaterally. No wheezes or crackles. Normal respiratory effort. Abdomen: Soft, non-tender.  Bowel sounds present.  Skin: Ecchymosis on right upper extremity.  Musculoskeletal: UE's in slings. Limited mobility 2/2 pain on b/l UE's.  Psychiatric: Grossly normal mood and affect. Neurologic: Moves all extremities in coordinated fashion.  I have personally reviewed the following labs, culture data, and imaging  studies.  Labs:  -Recent metabolic profile from outside hospital (11/4) shows sodium 140, potassium 5.6, bicarbonate 16, BUN 68, creatinine 2.49. -Recent CBC (11/4) shows white blood cell count 5.7, hemoglobin 8.2, platelet count 202.  Radiology:  Bilateral shoulder x-rays showed humeral shaft fractures and impacted proximal left humerus fracture.  Assessment/Plan: Mr. Cleone SlimCundiff is an 82 year old Caucasian male with past medical history significant for essential hypertension, CAD, hyperlipidemia, CKD stage V (not on HD). Pt [facility] [place] on 04/25/2016 after falling at home and experiencing bilateral humerus fractures.   #1 Bilateral humerus fractures X-rays at outside hospital showed right closed humeral shaft fracture and impacted left proximal humerus fracture. Patient has been evaluated by orthopedic surgery at outside hospital; they were planning for nonoperative approach. Patient is currently in a sling. He is endorsing mild pain. Plan: -No orthopedic consultation at this time; primary team to consider consultation in the morning. -Scheduled Tylenol for pain control. -Physical therapy consultation the morning. Patient will require skilled nursing facility placement  -Patient has brace in place; he should continue to wear this  #2 CAD / recent type 2 MI  Patient has underlining coronary artery disease and underwent CABG in 2004. Hospital course was complicated by type II MI with elevation in troponin secondary to demand ischemia. Cardiology has evaluated the patient; not planning for intervention. On my examination, patient is chest pain-free and in a normal sinus rhythm. Plan: -Obtain EKG now -Place pt on tele -Continue guideline directed therapy with aspirin, metoprolol tartrate, atorvastatin, and Imdur   #3 Recent atrial fibrillation with RVR This is also noted as a complication at outside hospital. Currently, patient is in a rate controlled normal sinus  rhythm. Plan: -Obtain EKG as outlined above  -Continue beta blocker  -No need for anticoagulation, as atrial fibrillation was likely secondary to type II MI and underlying stress of hospitalization. If patient is noted to have ongoing atrial fibrillation burden on telemetry, will need to have discussion with family to discuss benefits and risks of long-term anticoagulation.   #4 AKI in the setting of CKD  Patient was noted to have non-anion gap metabolic acidosis and mild hyperkalemia at outside hospital. Did not require hemodialysis. Nephrology already evaluated patient at outside hospital.  Plan: -Check CMP -Con't PO bicarb -Will not prescribe ACE inhibitor, NSAIDs, or IV contrast -Needs consultation w/ Nephrology in the morning  #5 Recent encephalopathy secondary to hospital acquired delirium  This was also noted at outside hospital. On my examination, patient is awake and alert. Family reports patient is at baseline mental status.  Plan: -Continue supportive care measures with frequent orientation. At this point time, I will not continue antipsychotic prescribed at outside hospital   #6 Normocytic Anemia,  Patient was found to have borderline iron deficiency anemia; this is likely secondary to see CKD. Patient underwent CT imaging of the abdomen and pelvis and this did not show a source of bleeding. Latest hemoglobin is 8.2. Patient is asymptomatic from an anemia perspective. Plan: -Check CBC and transfuse for hemoglobin less than 7.  #7 Urinary retention Patient is noted to have urinary retention that required Foley catheter placement. Plan: -Scheduled voiding -Con't Flomax -Avoid anti-cholinergic medication  DVT prophylaxis: Subq Lovenox Code Status: Full Code Disposition Plan: Anticipate D/C SNF in 2-5 days Consults called: N/A Admission status: Patient and family desire full code.  Tyrone Sage, MD [facility] Page:[phone]  If 7PM-7AM, please contact  night-coverage [email or web address] Password [email or web address]

## 2016-05-06 DIAGNOSIS — I10 Essential (primary) hypertension: Secondary | ICD-10-CM

## 2016-05-06 DIAGNOSIS — N179 Acute kidney failure, unspecified: Principal | ICD-10-CM

## 2016-05-06 DIAGNOSIS — I2510 Atherosclerotic heart disease of native coronary artery without angina pectoris: Secondary | ICD-10-CM

## 2016-05-06 DIAGNOSIS — I480 Paroxysmal atrial fibrillation: Secondary | ICD-10-CM

## 2016-05-06 DIAGNOSIS — D649 Anemia, unspecified: Secondary | ICD-10-CM | POA: Diagnosis present

## 2016-05-06 LAB — URINALYSIS, ROUTINE W REFLEX MICROSCOPIC
Bilirubin Urine: NEGATIVE
GLUCOSE, UA: NEGATIVE mg/dL
Hgb urine dipstick: NEGATIVE
Ketones, ur: NEGATIVE mg/dL
LEUKOCYTES UA: NEGATIVE
Nitrite: NEGATIVE
PH: 5.5 (ref 5.0–8.0)
Protein, ur: NEGATIVE mg/dL
SPECIFIC GRAVITY, URINE: 1.013 (ref 1.005–1.030)

## 2016-05-06 LAB — PHOSPHORUS: Phosphorus: 3.6 mg/dL (ref 2.5–4.6)

## 2016-05-06 LAB — MAGNESIUM: Magnesium: 2.4 mg/dL (ref 1.7–2.4)

## 2016-05-06 MED ORDER — SODIUM CHLORIDE 0.9 % IV SOLN
125.0000 mg | INTRAVENOUS | Status: DC
  Administered 2016-05-06 – 2016-05-08 (×2): 125 mg via INTRAVENOUS
  Filled 2016-05-06 (×3): qty 10

## 2016-05-06 MED ORDER — HYDROMORPHONE HCL 1 MG/ML IJ SOLN
1.0000 mg | INTRAMUSCULAR | Status: DC | PRN
  Administered 2016-05-06 – 2016-05-07 (×5): 1 mg via INTRAVENOUS
  Filled 2016-05-06 (×5): qty 1

## 2016-05-06 MED ORDER — HYDRALAZINE HCL 20 MG/ML IJ SOLN
10.0000 mg | Freq: Four times a day (QID) | INTRAMUSCULAR | Status: DC | PRN
  Administered 2016-05-06: 10 mg via INTRAVENOUS
  Filled 2016-05-06: qty 1

## 2016-05-06 MED ORDER — AMLODIPINE BESYLATE 5 MG PO TABS
5.0000 mg | ORAL_TABLET | Freq: Every day | ORAL | Status: DC
  Administered 2016-05-06 – 2016-05-08 (×3): 5 mg via ORAL
  Filled 2016-05-06 (×3): qty 1

## 2016-05-06 MED ORDER — ONDANSETRON HCL 4 MG/2ML IJ SOLN
4.0000 mg | Freq: Four times a day (QID) | INTRAMUSCULAR | Status: DC | PRN
  Administered 2016-05-06 – 2016-05-07 (×2): 4 mg via INTRAVENOUS
  Filled 2016-05-06 (×2): qty 2

## 2016-05-06 MED ORDER — ATORVASTATIN CALCIUM 40 MG PO TABS: 40.0000 mg | ORAL_TABLET | Freq: Every day | ORAL | Status: DC

## 2016-05-06 MED ORDER — SIMVASTATIN 40 MG PO TABS
40.0000 mg | ORAL_TABLET | Freq: Every day | ORAL | Status: DC
  Administered 2016-05-06 – 2016-05-08 (×3): 40 mg via ORAL
  Filled 2016-05-06 (×3): qty 1

## 2016-05-06 MED ORDER — METOPROLOL TARTRATE 25 MG PO TABS
25.0000 mg | ORAL_TABLET | Freq: Two times a day (BID) | ORAL | Status: DC
  Administered 2016-05-07 (×2): 25 mg via ORAL
  Filled 2016-05-06 (×2): qty 1

## 2016-05-06 MED ORDER — HYDRALAZINE HCL 50 MG PO TABS
50.0000 mg | ORAL_TABLET | Freq: Three times a day (TID) | ORAL | Status: DC
  Administered 2016-05-06 – 2016-05-08 (×7): 50 mg via ORAL
  Filled 2016-05-06 (×7): qty 1

## 2016-05-06 MED ORDER — FUROSEMIDE 40 MG PO TABS
40.0000 mg | ORAL_TABLET | Freq: Two times a day (BID) | ORAL | Status: DC
  Administered 2016-05-06 – 2016-05-08 (×5): 40 mg via ORAL
  Filled 2016-05-06 (×5): qty 1

## 2016-05-06 MED ORDER — DARBEPOETIN ALFA 100 MCG/0.5ML IJ SOSY
100.0000 ug | PREFILLED_SYRINGE | INTRAMUSCULAR | Status: DC
  Administered 2016-05-07: 100 ug via SUBCUTANEOUS
  Filled 2016-05-06 (×3): qty 0.5

## 2016-05-06 NOTE — Evaluation (Addendum)
Physical Therapy Evaluation Patient Details Name: [name] MRN: [id number] DOB: [birth year] Today's Date: 05/06/2016   History of Present Illness  [place] humeral fxs s/p fall. He was kicking the edge of a rug while on their deck and got his foot caught on the edge.  he fell left into the wall of the house, then subsequently fell onto the deck onto Rt shoulder.  He was transferred to [facility] for further medical work up. PMH significant for HTN, CAD/CABG (2004), TAVR 2015, dyslipidemia and very hard of hearing   Clinical Impression  Pt's cognition difficult to assess [facility]  Pt appears to be motivated to incr his mobility. Pt's daughter reports he was only walking 6325' at prior hospital.  Pt at fall risk secondary to decr functional UE use bil and immobilized UEs.  Will continue to follow patient while on this venue of care to progress mobility. Patient may benefit from further skilled PT to improve mobility, reduce fall risk and incr functional independence.    Follow Up Recommendations SNF;Supervision/Assistance - 24 hour    Equipment Recommendations  3in1 (PT);Wheelchair (measurements PT)    Recommendations for Other Services OT consult     Precautions / Restrictions Precautions Precautions: Fall Required Braces or Orthoses: Sling;Other Brace/Splint Other Brace/Splint: Sarmiento brace Rt x 6 weeks, Lt sling x 5 days Restrictions Weight Bearing Restrictions: Yes RUE Weight Bearing: Non weight bearing LUE Weight Bearing: Partial weight bearing LUE Partial Weight Bearing Percentage or Pounds: 5 pounds Other Position/Activity Restrictions: ROM on left UE only at this time      Mobility  Bed Mobility               General bed mobility comments: OOB upon arrival  Transfers Overall transfer level: Needs assistance Equipment used: None Transfers: Sit to/from Stand Sit to Stand: Mod assist          [facility]  Ambulation/Gait Ambulation/Gait assistance: Mod assist (recommend +2 for safety) Ambulation Distance (Feet): 75 Feet Assistive device: None Gait Pattern/deviations: Shuffle;Trunk flexed     General Gait Details: incr lateral excursion during gait, decr bil step length, heavy footed with initial contact, 1 standing rest break  Stairs            Wheelchair Mobility    Modified Rankin (Stroke Patients Only)       Balance Overall balance assessment: Needs assistance Sitting-balance support: No upper extremity supported;Feet supported Sitting balance-Leahy Scale: Fair     Standing balance support: No upper extremity supported Standing balance-Leahy Scale: Poor Standing balance comment: flexed trunk, 1 LOB in static standing posteriorly                             Pertinent Vitals/Pain Pain Assessment: No/denies pain (pain only with repositioning)    Home Living Family/patient expects to be discharged to:: Skilled nursing facility Living Arrangements: Spouse/significant other               Additional Comments: Pt may stay at his daughters after discharge from SNF    Prior Function Level of Independence: Independent               Hand Dominance        Extremity/Trunk Assessment   Upper Extremity Assessment: Defer to OT evaluation;Generalized weakness           Lower Extremity Assessment:  Overall WFL for tasks assessed      Cervical / Trunk Assessment: Kyphotic  Communication   Communication: HOH  Cognition Arousal/Alertness: Awake/alert Behavior During Therapy: Flat affect Overall Cognitive Status: Difficult to assess                      General Comments General comments (skin integrity, edema, etc.): post gait HR 74 bpm, pt not wearing sling upon arrival, pt fatigued by end of session. +3 pitting edema Rt dorsal aspect of hand    Exercises Other Exercises Other Exercises:  finger ext sitting Rt x 10 reps Other Exercises: wrist ext bil in sitting x 10   Assessment/Plan    PT Assessment Patient needs continued PT services  PT Problem List Decreased strength;Decreased range of motion;Decreased activity tolerance;Decreased balance;Decreased mobility;Decreased knowledge of use of DME;Decreased safety awareness;Decreased knowledge of precautions;Pain          PT Treatment Interventions DME instruction;Gait training;Functional mobility training;Therapeutic activities;Therapeutic exercise;Balance training;Neuromuscular re-education;Patient/family education;Wheelchair mobility training;Manual techniques;Modalities    PT Goals (Current goals can be found in the Care Plan section)  Acute Rehab PT Goals Patient Stated Goal: none reported PT Goal Formulation: With patient/family Time For Goal Achievement: 05/15/16 Potential to Achieve Goals: Fair    Frequency Min 3X/week   Barriers to discharge Decreased caregiver support pt's wife may not be able to provide much physical assist    Co-evaluation               End of Session Equipment Utilized During Treatment: Gait belt;Other (comment) (Rt Sarmiento and Lt sling) Activity Tolerance: Patient tolerated treatment well;Patient limited by fatigue Patient left: in chair;with call bell/phone within reach;with family/visitor present (requested soft call bell as pt unable to press standard call) Nurse Communication: Mobility status;Precautions;Weight bearing status         Time: [id number] PT Time Calculation (min) (ACUTE ONLY): 30 min   Charges:   PT Evaluation $PT Eval Moderate Complexity: 1 Procedure PT Treatments $Gait Training: 8-22 mins   PT G CodesNestor Hayes:       [name], PT [phone]  [name] 05/06/2016, 8:39 PM

## 2016-05-06 NOTE — Progress Notes (Signed)
[facility] PROGRESS NOTE  EMMANUELLE COXE ZOX:[id number] DOB: April 29, 1929 DOA: 05/05/2016  PCP: Toma Deiters, MD  Brief History/Interval Summary: Patient transferred from [facility] in [place]. This is a 82 year old Caucasian male with a past medical history of aortic valve stenosis, status post TAVR, hypertension, coronary artery disease, hyperlipidemia, chronic kidney disease who was admitted to an outside facility after sustaining a mechanical fall at home resulting in bilateral humerus fractures. His stay in the hospital in [place] was complicated by transient atrial fibrillation, acute on chronic renal failure with hyperkalemia and elevated troponin. Patient also was noted have metabolic acidosis. All these issues appear to be stabilizing. However, the patient's daughter then requested transfer to [facility] for further evaluation.  Reason for Visit: Acute on chronic kidney disease  Consultants: Nephrology  Procedures: None  Antibiotics: None  Subjective/Interval History: Patient appears to be somewhat confused this morning. He had rambling speech. Unable to obtain much information from him.  ROS: Unable to do due to confusion  Objective:  Vital Signs  Vitals:   05/05/16 2052 05/06/16 0454 05/06/16 0920 05/06/16 1137  BP: (!) 163/53 (!) 184/57 (!) 199/60 (!) 190/52  Pulse: 68 (!) 57 (!) 52   Resp: 19 19 18    Temp: 98.1 F (36.7 C) 98.4 F (36.9 C) 98.6 F (37 C)   TempSrc: Oral Oral Oral   SpO2: 98% 98% 100%   Weight:      Height:        Intake/Output Summary (Last 24 hours) at 05/06/16 1222 Last data filed at 05/06/16 1210  Gross per 24 hour  Intake                0 ml  Output             2650 ml  Net            -2650 ml   Filed Weights   05/05/16 1927  Weight: 85.5 kg (188 lb 7.9 oz)    General appearance: alert, distracted and no distress Head: Normocephalic, without obvious abnormality, atraumatic Resp: Diminished air entry at  the bases. No definite crackles, wheezing or rhonchi. Cardio: regular rate and rhythm, S1, S2 normal, no murmur, click, rub or gallop GI: soft, non-tender; bowel sounds normal; no masses,  no organomegaly Extremities: His right upper extremity is in a brace. Bruising is noted. Left upper extremity also shows bruising. Swelling is appreciated. Neurologic: He is awake. Distracted. Moving all his extremities. No facial asymmetry.  Lab Results:  Data Reviewed: I have personally reviewed following labs and imaging studies  CBC:  Recent Labs Lab 05/05/16 2123  WBC 6.5  HGB 8.3*  HCT 24.9*  MCV 92.9  PLT 190    Basic Metabolic Panel:  Recent Labs Lab 05/05/16 2123 05/06/16 0540  NA 134*  --   K 4.7  --   CL 109  --   CO2 19*  --   GLUCOSE 149*  --   BUN 64*  --   CREATININE 2.65*  --   CALCIUM 8.0*  --   MG 2.3 2.4  PHOS 3.2 3.6    GFR: Estimated Creatinine Clearance: 21 mL/min (by C-G formula based on SCr of 2.65 mg/dL (H)).  Liver Function Tests:  Recent Labs Lab 05/05/16 2123  AST 19  ALT 15*  ALKPHOS 45  BILITOT 0.8  PROT 5.0*  ALBUMIN 2.4*    Coagulation Profile:  Recent Labs Lab 05/05/16 2123  INR 1.15  Radiology Studies: No results found.   Medications:  Scheduled: . acetaminophen  650 mg Oral Q8H  . amLODipine  5 mg Oral Daily  . aspirin EC  81 mg Oral Daily  . enoxaparin (LOVENOX) injection  30 mg Subcutaneous Q24H  . isosorbide mononitrate  15 mg Oral Daily  . metoprolol  50 mg Oral BID  . simvastatin  40 mg Oral q1800  . sodium bicarbonate  650 mg Oral TID   Continuous:  BJY:[id number]:hydrALAZINE, HYDROmorphone (DILAUDID) injection  Assessment/Plan:  Active Problems:   AKI (acute kidney injury) (HCC)    Bilateral humerus fractures X-rays at outside hospital showed right closed humeral shaft fracture and impacted left proximal humerus fracture. Patient has been evaluated by orthopedic surgery at outside hospital; they were planning  for nonoperative approach. Patient is currently in a brace on the right. He is endorsing mild pain. Discussed with the patient's daughter. Patient is going to follow-[place] in [place], Dr. Case on November 14. No clear reason to repeat imaging studies during this hospitalization. He actually had x-rays repeated on November 4 at the outside facility. Reports are available. PT evaluation. He will likely need to go to skilled nursing facility.  CAD / recent type 2 MI  Patient has underlining coronary artery disease and underwent CABG in 2004. Current Hospital course was complicated by type II MI with elevation in troponin secondary to demand ischemia. Cardiology has evaluated the patient; not planning for intervention. On my examination, patient is chest pain-free and in a normal sinus rhythm.  Recent atrial fibrillation with RVR This is also noted as a complication at outside hospital. Currently, patient is in a rate controlled normal sinus rhythm. Patient has a history of coronary artery disease. He has had aortic valve replacement as well. Last echocardiogram from April 2017 showed normal systolic function with grade 2 diastolic dysfunction. Chads 2 vascular score is 4. He has complicated cardiac history. Anticoagulation was not initiated at the outside facility per their cardiologist. We will have our cardiologists here see him tomorrow since he is followed by this group as an outpatient. Continue aspirin for now.  AKI in the setting of CKD with non-anion gap metabolic acidosis Patient was noted to have non-anion gap metabolic acidosis and mild hyperkalemia at outside hospital. Did not require hemodialysis. Nephrology already evaluated patient at outside hospital. Monitor urine output. Discussed with nephrology, who will consult on this patient.  Essential hypertension Blood pressure is very poorly controlled. Continue metoprolol and Imdur. Hydralazine as needed. Add  amlodipine.  Recent encephalopathy secondary to hospital acquired delirium  This was also noted at outside hospital. Patient is mildly distracted this morning. Could be due to the fact that I woke him up. Continue to monitor closely. Does not have any focal deficits that I can see on my evaluation. Patient was on multiple psychotropic medications at the outside facility, all of which have been held for now.   Normocytic Anemia,  Patient was found to have borderline iron deficiency anemia; this is likely secondary to see CKD. Patient underwent CT imaging of the abdomen and pelvis and this did not show a source of bleeding. Latest hemoglobin is 8.2. Patient is asymptomatic from an anemia perspective.  Urinary retention Patient is noted to have urinary retention that required Foley catheter placement. Continue Flomax. Voiding trial when he is more mobile.  DVT Prophylaxis: Lovenox    Code Status: Full code  Family Communication: Discussed with his daughter  Disposition Plan:  Await specialty input. PT evaluation.    LOS: 1 day   [facility] LPKRISHNAN,Teah Votaw  [facility] Pager [phone] 05/06/2016, 12:22 PM  If 7PM-7AM, please contact night-coverage at [email or web address], password [email or web address]

## 2016-05-06 NOTE — Progress Notes (Signed)
Family brought lab result from [place]. Placed in shadow chart.  MD notified.

## 2016-05-06 NOTE — Consult Note (Signed)
Reason for Consult: Renal function abnormalities Referring Physician: [name] is an 82 y.o. male with PMH of hypertension, CAD/CABG (2004), TAVR 2015, hyperlipidemia, CKD stage IV, and very hard of hearing. HPI: Patient is a transfer from OSH in [place] after mechanical fall that resulted in bilateral humerus fractures. He was to be discharged to SNF, but family wanted to resume care in [place]. He initially had non-anion gap metabolic acidosis and worsening hyperkalemia. Nephrologist at OSH was concerned about continued renal function abnormalities and recommended continued monitoring with telemetry, labs, and bicarb administration to ensure stability for rehab. Recent metabolic profile from outside hospital (11/4) shows sodium 140, potassium 5.6, bicarbonate 16, BUN 68, creatinine 2.49. Anion gap of 16, 19 when corrected for hypoalbuminemia. BL ~ 2.5 (initial SCr 2.27 10/25). SCr was as high as 4.28 04/29/16. Daughter [name] brought outside clinic records showing SCr of 2.47 and K of 5.3 with anion gap of 18 on 11/08/15.  Patient's daughter [name] mentioned that he was "hypotensive for him" with SBP in the 110s at OSH due to hemorrhaging blood in his upper arms due to fractures. She also reports he did not receive food or IVFs for about 24 hours before getting a bed and had several bouts of emesis after getting morphine.   Per OSH discharge summary, FENa showed intrinsic renal failure, and AKI etiology was not determined, as he had received no iodinated contrast or NSAIDs and home lisinopril had been discontinued upon hospitalization. Started on iron supplementation and EPO at OSH; iron studies 10/27 showed iron of 38, TIBC 186. Vitamin B12 borderline low at 197. Vit D low at 18.    Past Medical History:  Diagnosis Date  . Aortic stenosis   . Arthritis   . CKD (chronic kidney disease) stage 3, GFR 30-59 ml/min   . Coronary atherosclerosis of native coronary artery    Multivessel, LVEF 65%  .  Essential hypertension, benign   . Mixed hyperlipidemia   . Myocardial infarction   . 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 APPENDECTOMY    . CATARACT EXTRACTION Left 04/27/13   Dr. Iona Hansen  . CORONARY ARTERY BYPASS GRAFT     LIMA to LAD, SVG to OM, SVG to RCA, SVG to diagonal 11/04  . EYE SURGERY    . INTRAOPERATIVE TRANSESOPHAGEAL ECHOCARDIOGRAM N/A 11/17/2013   Procedure: INTRAOPERATIVE TRANSESOPHAGEAL ECHOCARDIOGRAM;  Surgeon: Sherren Mocha, MD;  Location: [facility] OR;  Service: Open Heart Surgery;  Laterality: N/A;  . LEFT AND RIGHT HEART CATHETERIZATION WITH CORONARY/GRAFT ANGIOGRAM N/A 10/13/2013   Procedure: LEFT AND RIGHT HEART CATHETERIZATION WITH Beatrix Fetters;  Surgeon: Blane Ohara, MD;  Location: [facility] CATH LAB;  Service: Cardiovascular;  Laterality: N/A;  . Left total knee arthroplasty  04  . TRANSCATHETER AORTIC VALVE REPLACEMENT, TRANSFEMORAL N/A 11/17/2013   Procedure: TRANSCATHETER AORTIC VALVE REPLACEMENT, TRANSFEMORAL;  Surgeon: Sherren Mocha, MD;  Location: [facility];  Service: Open Heart Surgery;  Laterality: N/A;  . UMBILICAL HERNIA REPAIR      Family History  Problem Relation Age of Onset  . Cirrhosis Father     Social History:  reports that he has never smoked. He has never used smokeless tobacco. He reports that he does not drink alcohol or use drugs.  Allergies:  Allergies  Allergen Reactions  . Lipitor [Atorvastatin Calcium] Other (  See Comments)    Leg pain    Medications:  Scheduled: . acetaminophen  650 mg Oral Q8H  . amLODipine  5 mg Oral Daily  . aspirin EC  81 mg Oral Daily  . enoxaparin (LOVENOX) injection  30 mg Subcutaneous Q24H  . isosorbide mononitrate  15 mg Oral Daily  . metoprolol  50 mg Oral BID  . simvastatin  40 mg Oral q1800  . sodium bicarbonate  650 mg  Oral TID    Results for orders placed or performed during the hospital encounter of 05/05/16 (from the past 48 hour(s))  Comprehensive metabolic panel     Status: Abnormal   Collection Time: 05/05/16  9:23 PM  Result Value Ref Range   Sodium 134 (L) 135 - 145 mmol/L   Potassium 4.7 3.5 - 5.1 mmol/L   Chloride 109 101 - 111 mmol/L   CO2 19 (L) 22 - 32 mmol/L   Glucose, Bld 149 (H) 65 - 99 mg/dL   BUN 64 (H) 6 - 20 mg/dL   Creatinine, Ser 8.24 (H) 0.61 - 1.24 mg/dL   Calcium 8.0 (L) 8.9 - 10.3 mg/dL   Total Protein 5.0 (L) 6.5 - 8.1 g/dL   Albumin 2.4 (L) 3.5 - 5.0 g/dL   AST 19 15 - 41 U/L   ALT 15 (L) 17 - 63 U/L   Alkaline Phosphatase 45 38 - 126 U/L   Total Bilirubin 0.8 0.3 - 1.2 mg/dL   GFR calc non Af Amer 20 (L) >60 mL/min   GFR calc Af Amer 24 (L) >60 mL/min    Comment: (NOTE) The eGFR has been calculated using the CKD EPI equation. This calculation has not been validated in all clinical situations. eGFR's persistently <60 mL/min signify possible Chronic Kidney Disease.    Anion gap 6 5 - 15  Protime-INR     Status: None   Collection Time: 05/05/16  9:23 PM  Result Value Ref Range   Prothrombin Time 14.8 11.4 - 15.2 seconds   INR 1.15   Magnesium     Status: None   Collection Time: 05/05/16  9:23 PM  Result Value Ref Range   Magnesium 2.3 1.7 - 2.4 mg/dL  Phosphorus     Status: None   Collection Time: 05/05/16  9:23 PM  Result Value Ref Range   Phosphorus 3.2 2.5 - 4.6 mg/dL  Lactic acid, plasma     Status: None   Collection Time: 05/05/16  9:23 PM  Result Value Ref Range   Lactic Acid, Venous 1.0 0.5 - 1.9 mmol/L  CBC     Status: Abnormal   Collection Time: 05/05/16  9:23 PM  Result Value Ref Range   WBC 6.5 4.0 - 10.5 K/uL   RBC 2.68 (L) 4.22 - 5.81 MIL/uL   Hemoglobin 8.3 (L) 13.0 - 17.0 g/dL   HCT 28.0 (L) 72.2 - 69.1 %   MCV 92.9 78.0 - 100.0 fL   MCH 31.0 26.0 - 34.0 pg   MCHC 33.3 30.0 - 36.0 g/dL   RDW 98.0 48.7 - 88.8 %   Platelets 190 150 -  400 K/uL  Urinalysis, Routine w reflex microscopic (not at [facility])     Status: None   Collection Time: 05/06/16  3:52 AM  Result Value Ref Range   Color, Urine YELLOW YELLOW   APPearance CLEAR CLEAR   Specific Gravity, Urine 1.013 1.005 - 1.030   pH 5.5 5.0 - 8.0   Glucose, UA NEGATIVE NEGATIVE mg/dL  Hgb urine dipstick NEGATIVE NEGATIVE   Bilirubin Urine NEGATIVE NEGATIVE   Ketones, ur NEGATIVE NEGATIVE mg/dL   Protein, ur NEGATIVE NEGATIVE mg/dL   Nitrite NEGATIVE NEGATIVE   Leukocytes, UA NEGATIVE NEGATIVE    Comment: MICROSCOPIC NOT DONE ON URINES WITH NEGATIVE PROTEIN, BLOOD, LEUKOCYTES, NITRITE, OR GLUCOSE <1000 mg/dL.  Phosphorus     Status: None   Collection Time: 05/06/16  5:40 AM  Result Value Ref Range   Phosphorus 3.6 2.5 - 4.6 mg/dL  Magnesium     Status: None   Collection Time: 05/06/16  5:40 AM  Result Value Ref Range   Magnesium 2.4 1.7 - 2.4 mg/dL    No results found.  ROS: Bilateral arm pain, no CP, no SOB, no abdominal pain, no dysuria Blood pressure (!) 190/52, pulse (!) 52, temperature 98.6 F (37 C), temperature source Oral, resp. rate 18, height '5\' 10"'$  (1.778 m), weight 188 lb 7.9 oz (85.5 kg), SpO2 100 %. Physical Exam  General: Elderly male, resting in bed.  HEENT: MMM CVA: RRR, no m/r/g or LE edema, bilateral mild hip edema, JVD Lung: CTAB, no increased WOB  Abdomen: Soft, NT, ND  Extremities: Swollen hands and upper arms, brace and sling in place R arm Neurologic: AOx3, follows commands Skin: Ecchymoses of upper extremities  Assessment/Plan: Acute on Chronic CKD: Near baseline today. Perhaps ATN in the setting of hypovolemia with blood loss and dehydration. Eating some of his meals but drinking well. Continue bicarb. Hyperkalemia: Resolved. Monitor closely once restarting lasix.  Metabolic acidosis: Resolved. No lactic acidosis on admission to [facility]. Likely secondary to uremia, chronic renal failure. Volume: Excess volume with upper extremity  dependent edema, JVD. Will resume lasix, at increased dose of 40 mg BID, taper as needed (was on low dose 20 mg daily at home). Daily weights and strict I/Os.  Hypoalbuminemia: Would recommend nepro if supplements are used Anemia: Stable at 8.3. As low as 7.3 10/27. Had 2U pRBCs 10/25. 10.4 on 10/25. Started on iron supplementation and EPO at OSH. Will give darbepoetin and IV iron for now.  Urinary retention: Required foley catheter placement at OSH, removed 11/2. Continue flomax, scheduled voiding. Good UOP. HTN: Continue to hold home lisinopril. On home imdur 15 mg and metoprolol. Restart home hydralazine at decreased dose of 50 mg TID, titrate up as needed. Restart lasix at increased dose.  Bradycardia: Will decrease home metoprolol to 25 mg BID. Encephalopathy: Now resolved. Thought to be 2/2 hospital acquired delirium (had 5 room changes per daughter). Does not appear uremic on exam and is now AOx3.   Hillary Torrie Mayers 05/06/2016, 12:06 PM   Pt seen, examined and agree w A/P as above. Pt transferred from outside facility for 2nd opinion.  Has stable CKD now, had AKI episode at outside facility which has resolved.  Today K , acidosis under control, mild-mod vol excess on exam.. Have restarted po lasix at higher than home dose.  Starting esa/ Fe also for anemia.  Will follow.  Kelly Splinter MD [employer] pager [phone]   05/06/2016, 2:44 PM

## 2016-05-07 ENCOUNTER — Other Ambulatory Visit: Payer: Self-pay | Admitting: Cardiology

## 2016-05-07 ENCOUNTER — Ambulatory Visit: Payer: Medicare PPO | Admitting: Cardiology

## 2016-05-07 DIAGNOSIS — N184 Chronic kidney disease, stage 4 (severe): Secondary | ICD-10-CM

## 2016-05-07 DIAGNOSIS — I480 Paroxysmal atrial fibrillation: Secondary | ICD-10-CM

## 2016-05-07 DIAGNOSIS — N189 Chronic kidney disease, unspecified: Secondary | ICD-10-CM

## 2016-05-07 DIAGNOSIS — I350 Nonrheumatic aortic (valve) stenosis: Secondary | ICD-10-CM

## 2016-05-07 LAB — BASIC METABOLIC PANEL
ANION GAP: 7 (ref 5–15)
BUN: 52 mg/dL — ABNORMAL HIGH (ref 6–20)
CALCIUM: 8 mg/dL — AB (ref 8.9–10.3)
CHLORIDE: 106 mmol/L (ref 101–111)
CO2: 23 mmol/L (ref 22–32)
Creatinine, Ser: 2.68 mg/dL — ABNORMAL HIGH (ref 0.61–1.24)
GFR calc non Af Amer: 20 mL/min — ABNORMAL LOW (ref >60)
GFR, EST AFRICAN AMERICAN: 23 mL/min — AB (ref >60)
GLUCOSE: 110 mg/dL — AB (ref 65–99)
Potassium: 5.1 mmol/L (ref 3.5–5.1)
Sodium: 136 mmol/L (ref 135–145)

## 2016-05-07 LAB — COMPREHENSIVE METABOLIC PANEL
ALBUMIN: 2.5 g/dL — AB (ref 3.5–5.0)
ALK PHOS: 49 U/L (ref 38–126)
ALT: 19 U/L (ref 17–63)
ANION GAP: 7 (ref 5–15)
AST: 20 U/L (ref 15–41)
BUN: 54 mg/dL — ABNORMAL HIGH (ref 6–20)
CALCIUM: 8.1 mg/dL — AB (ref 8.9–10.3)
CHLORIDE: 107 mmol/L (ref 101–111)
CO2: 21 mmol/L — AB (ref 22–32)
Creatinine, Ser: 2.57 mg/dL — ABNORMAL HIGH (ref 0.61–1.24)
GFR calc Af Amer: 25 mL/min — ABNORMAL LOW (ref >60)
GFR calc non Af Amer: 21 mL/min — ABNORMAL LOW (ref >60)
GLUCOSE: 119 mg/dL — AB (ref 65–99)
Potassium: 5.4 mmol/L — ABNORMAL HIGH (ref 3.5–5.1)
SODIUM: 135 mmol/L (ref 135–145)
Total Bilirubin: 0.9 mg/dL (ref 0.3–1.2)
Total Protein: 5 g/dL — ABNORMAL LOW (ref 6.5–8.1)

## 2016-05-07 LAB — CBC
HCT: 25.3 % — ABNORMAL LOW (ref 39.0–52.0)
HEMOGLOBIN: 8.2 g/dL — AB (ref 13.0–17.0)
MCH: 30.3 pg (ref 26.0–34.0)
MCHC: 32.4 g/dL (ref 30.0–36.0)
MCV: 93.4 fL (ref 78.0–100.0)
PLATELETS: 211 10*3/uL (ref 150–400)
RBC: 2.71 MIL/uL — AB (ref 4.22–5.81)
RDW: 14.3 % (ref 11.5–15.5)
WBC: 6.9 10*3/uL (ref 4.0–10.5)

## 2016-05-07 LAB — URINE CULTURE: Culture: NO GROWTH

## 2016-05-07 LAB — PHOSPHORUS: PHOSPHORUS: 4.3 mg/dL (ref 2.5–4.6)

## 2016-05-07 LAB — MAGNESIUM: MAGNESIUM: 2.2 mg/dL (ref 1.7–2.4)

## 2016-05-07 MED ORDER — SODIUM POLYSTYRENE SULFONATE 15 GM/60ML PO SUSP
15.0000 g | Freq: Once | ORAL | Status: AC
  Administered 2016-05-07: 15 g via ORAL
  Filled 2016-05-07: qty 60

## 2016-05-07 MED ORDER — FAMOTIDINE 20 MG PO TABS
20.0000 mg | ORAL_TABLET | Freq: Two times a day (BID) | ORAL | Status: DC
  Administered 2016-05-07 – 2016-05-08 (×3): 20 mg via ORAL
  Filled 2016-05-07 (×3): qty 1

## 2016-05-07 MED ORDER — OXYCODONE HCL 5 MG PO TABS
5.0000 mg | ORAL_TABLET | ORAL | Status: DC | PRN
  Administered 2016-05-08: 5 mg via ORAL
  Filled 2016-05-07: qty 2

## 2016-05-07 MED ORDER — OXYCODONE-ACETAMINOPHEN 5-325 MG PO TABS
1.0000 | ORAL_TABLET | Freq: Three times a day (TID) | ORAL | Status: DC
  Administered 2016-05-07 – 2016-05-08 (×4): 1 via ORAL
  Filled 2016-05-07 (×5): qty 1

## 2016-05-07 MED ORDER — METOPROLOL TARTRATE 12.5 MG HALF TABLET
12.5000 mg | ORAL_TABLET | Freq: Two times a day (BID) | ORAL | Status: DC
  Administered 2016-05-07 – 2016-05-08 (×2): 12.5 mg via ORAL
  Filled 2016-05-07 (×2): qty 1

## 2016-05-07 MED ORDER — ACETAMINOPHEN 325 MG PO TABS: 650.0000 mg | ORAL_TABLET | Freq: Four times a day (QID) | ORAL | Status: DC | PRN

## 2016-05-07 NOTE — Progress Notes (Signed)
[facility] Progress Note   Background: [name] is an 82 y.o. male with PMH of hypertension, CAD/CABG (2004), TAVR 2015, hyperlipidemia, CKD stage IV, and very hard of hearing. He is a transfer from [place]SH in [place] after mechanical fall that resulted in bilateral humerus fractures and had hyperkalemia and AoCKD, requiring further monitoring. SCr was near baseline of 2.4-2.6 [facility] but had been as high as 4.28 on 04/29/16.   Assessment/ Plan:    Acute on Chronic CKD: At baseline today. Perhaps had ATN in the setting of hypovolemia with blood loss and dehydration. Eating some of his meals but drinking well. Continue bicarb. Would be appropriate for discharge to SNF for rehab now, with close monitoring of potassium.  Hyperkalemia: Increased today despite starting lasix. Given a dose of kayexalate. Change to renal diet. Continue to monitor.   Metabolic acidosis: Resolved. No lactic acidosis on [facility] to [facility]. Likely secondary to uremia, chronic renal failure. Volume: Excess volume with upper extremity dependent edema, JVD (improved today). Continue lasix, at increased dose of 40 mg BID, taper as needed (was on low dose 20 mg daily at home). Daily weights and strict I/Os.  Hypoalbuminemia: Would recommend nepro if supplements are used. Anemia: Stable at 8.3. As low as 7.3 10/27. Had 2U pRBCs 10/25. 10.4 on 10/25. Started on iron supplementation and EPO at OSH. S/p 1 dose of darbepoetin (will not continue) and IV iron while hospitalized -- would switch to ferrous sulfate 325 mg BID upon discharge.  Urinary retention: Required foley catheter placement at OSH, removed 11/2. Continue flomax, scheduled voiding. Good UOP. HTN: Improved. Continue to hold home lisinopril. On home imdur 15 mg and reduced dose of metoprolol. Restarted home hydralazine at decreased dose of 50 mg TID 11/5, titrate up as needed. Restarted lasix at increased dose 11/5.  Bradycardia: Decreased home  metoprolol to 25 mg BID 11/5. Decrease further to 12.5 mg BID.  Encephalopathy: Now resolved. Thought to be 2/2 hospital acquired delirium (had 5 room changes per daughter). Does not appear uremic on exam and is now AOx3.  Subjective:   Patient complains of arm pain. Otherwise, no acute events overnight.    Objective:   BP (!) 120/51   Pulse (!) 50   Temp 98.2 F (36.8 C) (Oral)   Resp 18   Ht 5\' 10"  (1.778 m)   Wt 199 lb 1.2 oz (90.3 kg)   SpO2 99%   BMI 28.56 kg/m   Intake/Output Summary (Last 24 hours) at 05/07/16 1201 Last data filed at 05/07/16 0900  Gross per 24 hour  Intake             1730 ml  Output             1550 ml  Net              180 ml   Net negative 1.7 since admission.   Weight change: 10 lb 9.3 oz (4.8 kg)  Physical Exam: General: Elderly male, resting in bed.  Extremely hard of hearing/ HEENT: MMM CVA: RRR, no m/r/g or LE edema, bilateral mild hip edema, improved JVD Lung: CTAB, no increased WOB  Abdomen: Soft, NT, ND  Extremities: Swollen hands and upper arms, brace and sling in place R arm Neurologic: AOx3, follows commands Skin: Ecchymoses of upper extremities    Recent Labs Lab 05/05/16 2123 05/06/16 0540 05/07/16 0725  NA 134*  --  135  K 4.7  --  5.4*  CL 109  --  107  CO2 19*  --  21*  GLUCOSE 149*  --  119*  BUN 64*  --  54*  CREATININE 2.65*  --  2.57*  CALCIUM 8.0*  --  8.1*  PHOS 3.2 3.6 4.3     Recent Labs Lab 05/05/16 2123 05/07/16 0725  WBC 6.5 6.9  HGB 8.3* 8.2*  HCT 24.9* 25.3*  MCV 92.9 93.4  PLT 190 211    Medications:    . amLODipine  5 mg Oral Daily  . aspirin EC  81 mg Oral Daily  . enoxaparin (LOVENOX) injection  30 mg Subcutaneous Q24H  . ferric gluconate (FERRLECIT/NULECIT) IV  125 mg Intravenous QODAY  . furosemide  40 mg Oral BID  . hydrALAZINE  50 mg Oral Q8H  . isosorbide mononitrate  15 mg Oral Daily  . metoprolol  12.5 mg Oral BID  . oxyCODONE-acetaminophen  1 tablet Oral TID  .  simvastatin  40 mg Oral q1800  . sodium bicarbonate  650 mg Oral TID   Dani GobbleHillary Fitzgerald, MD [facility], PGY-2 05/07/2016, 12:01 PM   I have seen and examined this patient and agree with plan and assessment in the above note with renal recommendations/intervention highlighted. Creatinine is at his baseline. K drifting up - primary team ordered kayexalate and we have changed to a renal diet.   Kirby Cortese B,MD 05/07/2016 5:00 PM

## 2016-05-07 NOTE — Progress Notes (Signed)
[facility] PROGRESS NOTE  Craig DingsJames H Hayes VWU:[id number] DOB: 07/03/1928 DOA: 05/05/2016  PCP: Toma DeitersXAJE A HASANAJ, MD  Brief History/Interval Summary: Patient [facility] from [facility] [place]. This is a 82 year old Caucasian male with a past medical history of aortic valve stenosis, status post TAVR, hypertension, coronary artery disease, hyperlipidemia, chronic kidney disease who was admitted to an outside facility after sustaining a mechanical fall at home resulting in bilateral humerus fractures. His stay in the hospital in [place] was complicated by transient atrial fibrillation, acute on chronic renal failure with hyperkalemia and elevated troponin. Patient also was noted have metabolic acidosis. All these issues appear to be stabilizing. However, the patient's daughter then requested [facility].  Reason for Visit: Acute on chronic kidney disease  Consultants: Nephrology. Cardiology  Procedures: None  Antibiotics: None  Subjective/Interval History: Patient appears to be distracted, although he is awake, alert. He denies any complaints. Unable to obtain much information from him. His granddaughter was at the bedside.  ROS: Unable to do due to confusion  Objective:  Vital Signs  Vitals:   05/06/16 2150 05/07/16 0556 05/07/16 0826 05/07/16 1025  BP: (!) 115/44 (!) 141/52 (!) 120/51 (!) 120/51  Pulse: (!) 58 (!) 56 (!) 50 (!) 50  Resp: 18 18 18    Temp: 97.9 F (36.6 C) 98.2 F (36.8 C) 98.2 F (36.8 C)   TempSrc: Oral Oral Oral   SpO2: 95% 97% 99%   Weight: 90.3 kg (199 lb 1.2 oz)     Height:        Intake/Output Summary (Last 24 hours) at 05/07/16 1039 Last data filed at 05/07/16 0900  Gross per 24 hour  Intake             1730 ml  Output             1550 ml  Net              180 ml   Filed Weights   05/05/16 1927 05/06/16 2150  Weight: 85.5 kg (188 lb 7.9 oz) 90.3 kg (199 lb 1.2 oz)    General  appearance: alert, distracted and no distress Resp: Diminished air entry at the bases. No definite crackles, wheezing or rhonchi. Cardio: regular rate and rhythm, S1, S2 normal, no murmur, click, rub or gallop GI: soft, non-tender; bowel sounds normal; no masses,  no organomegaly Extremities: His right upper extremity is in a brace. Bruising is noted. Left upper extremity also shows bruising. Swelling is appreciated. Neurologic: He is awake. Distracted. Moving his lower extremities. No facial asymmetry.  Lab Results:  Data Reviewed: I have personally reviewed following labs and imaging studies  CBC:  Recent Labs Lab 05/05/16 2123 05/07/16 0725  WBC 6.5 6.9  HGB 8.3* 8.2*  HCT 24.9* 25.3*  MCV 92.9 93.4  PLT 190 211    Basic Metabolic Panel:  Recent Labs Lab 05/05/16 2123 05/06/16 0540 05/07/16 0725  NA 134*  --  135  K 4.7  --  5.4*  CL 109  --  107  CO2 19*  --  21*  GLUCOSE 149*  --  119*  BUN 64*  --  54*  CREATININE 2.65*  --  2.57*  CALCIUM 8.0*  --  8.1*  MG 2.3 2.4 2.2  PHOS 3.2 3.6 4.3    GFR: Estimated Creatinine Clearance: 23.7 mL/min (by C-G formula based on SCr of 2.57 mg/dL (H)).  Liver Function Tests:  Recent  Labs Lab 05/05/16 2123 05/07/16 0725  AST 19 20  ALT 15* 19  ALKPHOS 45 49  BILITOT 0.8 0.9  PROT 5.0* 5.0*  ALBUMIN 2.4* 2.5*    Coagulation Profile:  Recent Labs Lab 05/05/16 2123  INR 1.15    Radiology Studies: No results found.   Medications:  Scheduled: . amLODipine  5 mg Oral Daily  . aspirin EC  81 mg Oral Daily  . darbepoetin (ARANESP) injection - NON-DIALYSIS  100 mcg Subcutaneous Q Sun-1800  . enoxaparin (LOVENOX) injection  30 mg Subcutaneous Q24H  . ferric gluconate (FERRLECIT/NULECIT) IV  125 mg Intravenous QODAY  . furosemide  40 mg Oral BID  . hydrALAZINE  50 mg Oral Q8H  . isosorbide mononitrate  15 mg Oral Daily  . metoprolol  25 mg Oral BID  . oxyCODONE-acetaminophen  1 tablet Oral TID  .  simvastatin  40 mg Oral q1800  . sodium bicarbonate  650 mg Oral TID   Continuous:  TIW:[id number], ondansetron (ZOFRAN) IV, oxyCODONE  Assessment/Plan:  Active Problems:   Essential hypertension, benign   CORONARY ATHEROSCLEROSIS NATIVE CORONARY ARTERY   Aortic stenosis   CKD (chronic kidney disease) stage 4, GFR 15-29 ml/min (HCC)   S/P TAVR (transcatheter aortic valve replacement)   Atrial fibrillation (HCC)   AKI (acute kidney injury) (HCC)   Normocytic anemia    Bilateral humerus fractures X-rays at outside hospital showed right closed humeral shaft fracture and impacted left proximal humerus fracture. Patient has been evaluated by orthopedic surgery at outside hospital; they were planning for nonoperative approach. Patient is currently in a brace on the right. He is endorsing mild pain. Discussed with the patient's daughter. Patient is going to follow-up with an orthopedic doctor in [place], Dr. Case on November 14. No clear reason to repeat imaging studies during this hospitalization. He actually had x-rays repeated on November 4 at the outside facility. Reports are available. PT evaluation. He will likely need to go to skilled nursing facility.  CAD / recent type 2 MI  Patient has underlining coronary artery disease and underwent CABG in 2004. Hospital course at [facility] was complicated by type II MI with elevation in troponin secondary to demand ischemia. Cardiology has evaluated the patient at the other hospital. There was no plan for any intervention. Cardiology has been consulted to see the patient today.  Paroxysmal atrial fibrillation This is also noted as a complication at outside hospital. Currently, patient is in a rate controlled normal sinus rhythm. Patient has a history of coronary artery disease. He has had aortic valve replacement as well. Last echocardiogram from April 2017 showed normal systolic function with grade 2 diastolic dysfunction. Chads 2 vascular score is  4. He has complicated cardiac history. Anticoagulation was not initiated at the outside facility per their cardiologist. We will have our cardiologists here see him since he is followed by this group as an outpatient. Continue aspirin for now.  AKI in the setting of CKD with non-anion gap metabolic acidosis Patient was noted to have non-anion gap metabolic acidosis and mild hyperkalemia at outside hospital. Did not require hemodialysis. Nephrology already evaluated patient at outside hospital. Monitor urine output. Nephrology is following. Hyperkalemia noted this morning. He'll be given Kayexalate. He was started on Lasix by nephrology yesterday. Has reasonably good urine output. Creatinine is stable.  Essential hypertension Blood pressure was very poorly controlled. Amlodipine was added yesterday. Blood pressure appears to be better controlled now. Continue to monitor. Continue metoprolol and Imdur.  Recent encephalopathy secondary to hospital acquired delirium  This was also noted at outside hospital. Patient remains mildly distracted. No focal deficits are noted. Patient was on multiple psychotropic medications at the outside facility, all of which have been held for now.   Normocytic Anemia,  Patient was found to have borderline iron deficiency anemia; this is likely secondary to see CKD. Patient underwent CT imaging of the abdomen and pelvis and this did not show a source of bleeding. Latest hemoglobin is 8.2. Patient is asymptomatic from an anemia perspective.  Urinary retention Patient did have an episode of retention yesterday requiring in and out catheterization. Continue Flomax. Able to urinate when he stands up to do so. Has difficulty urinating while lying down. If he continues to have episodes of retention may have to leave catheter in. Continue to monitor.   DVT Prophylaxis: Lovenox    Code Status: Full code  Family Communication: Discussed with his granddaughter Disposition  Plan: Await cardiology input today. Await further from GI input. Plan will be for him to go to skilled nursing facility as early as tomorrow.    LOS: 2 days   [facility],Kolsen Choe  [facility] Pager [phone] 05/07/2016, 10:39 AM  If 7PM-7AM, please contact night-coverage at [email or web address], password [email or web address]

## 2016-05-07 NOTE — Progress Notes (Deleted)
Discharge instructions and medications discussed with patient.  Home medication returned to patient from main pharmacy.  All questions answered.

## 2016-05-07 NOTE — Evaluation (Signed)
Occupational Therapy Evaluation Patient Details Name: [name] MRN: [id number] DOB: May 02, 1929 Today's Date: 05/07/2016    History of Present Illness Pt [place] in [place] with bil humeral fxs s/p fall. He was kicking the edge of a rug while on their deck and got his foot caught on the edge.  he fell left into the wall of the house, then subsequently fell onto the deck onto Rt shoulder.  He was transferred to [facility] for further medical work up. PMH significant for HTN, CAD/CABG (2004), TAVR 2015, dyslipidemia and very hard of hearing   Clinical Impression   Pt was independent prior to admission. Currently, pt has no functional use of B UEs and requires total care for ADL. Will follow acutely. Recommending SNF.    Follow Up Recommendations  SNF;Supervision/Assistance - 24 hour    Equipment Recommendations       Recommendations for Other Services       Precautions / Restrictions Precautions Precautions: Fall Required Braces or Orthoses: Sling;Other Brace/Splint Other Brace/Splint: Sarmiento brace Rt x 6 weeks, Lt sling x 5 days Restrictions Weight Bearing Restrictions: Yes RUE Weight Bearing: Non weight bearing LUE Weight Bearing: Partial weight bearing LUE Partial Weight Bearing Percentage or Pounds: 5 pounds Other Position/Activity Restrictions: ROM on left elbow to hand only at this time      Mobility Bed Mobility               General bed mobility comments: had just returned to bed with nursing  Transfers Overall transfer level: Needs assistance                    Balance                                            ADL Overall ADL's : Needs assistance/impaired                                       General ADL Comments: requires total care     Vision     Perception     Praxis      Pertinent Vitals/Pain Pain Assessment: Faces Faces Pain Scale: Hurts even more Pain  Location: shoulders Pain Descriptors / Indicators: Aching Pain Intervention(s): Repositioned;Ice applied     Hand Dominance Right   Extremity/Trunk Assessment Upper Extremity Assessment Upper Extremity Assessment: RUE deficits/detail;LUE deficits/detail RUE Deficits / Details: moderate edema, performed retrograde massage to hand and forearm, positioned on pillow and iced, performed AAROM of hand and wrist RUE: Unable to fully assess due to immobilization RUE Sensation: decreased light touch RUE Coordination: decreased fine motor;decreased gross motor LUE Deficits / Details: performed elbow, wrist and hand ROM, gave squeeze ball  LUE: Unable to fully assess due to immobilization LUE Coordination: decreased gross motor   Lower Extremity Assessment Lower Extremity Assessment: Defer to PT evaluation   Cervical / Trunk Assessment Cervical / Trunk Assessment: Kyphotic   Communication Communication Communication: HOH   Cognition Arousal/Alertness: Awake/alert Behavior During Therapy: WFL for tasks assessed/performed (tearful at times) Overall Cognitive Status: Difficult to assess                     General Comments       Exercises  Shoulder Instructions      Home Living Family/patient expects to be discharged to:: Skilled nursing facility                                        Prior Functioning/Environment Level of Independence: Independent                 OT Problem List: Decreased activity tolerance;Decreased strength;Decreased range of motion;Impaired balance (sitting and/or standing);Decreased coordination;Decreased knowledge of use of DME or AE;Impaired UE functional use;Increased edema;Pain   OT Treatment/Interventions: Patient/family education;Therapeutic activities;Therapeutic exercise    OT Goals(Current goals can be found in the care plan section) Acute Rehab OT Goals Patient Stated Goal: get back to his [employer]  class OT Goal Formulation: With patient Time For Goal Achievement: 05/14/16 Potential to Achieve Goals: Good ADL Goals Pt/caregiver will Perform Home Exercise Program: Left upper extremity;With Supervision (AROM elbow to hand) Additional ADL Goal #1: Pt and family will be knowledgeable in edema management of R hand, forearm.  OT Frequency: Min 2X/week   Barriers to D/C:            Co-evaluation              End of Session    Activity Tolerance: Patient tolerated treatment well Patient left: in bed;with call bell/phone within reach   Time: 1140-1209 OT Time Calculation (min): 29 min Charges:  OT General Charges $OT Visit: 1 Procedure OT Evaluation $OT Eval Moderate Complexity: 1 Procedure OT Treatments $Therapeutic Activity: 8-22 mins G-Codes:    Evern BioMayberry, Bretta Fees Lynn 05/07/2016, 12:32 PM  [phone]

## 2016-05-07 NOTE — Progress Notes (Signed)
    30 day monitor order to assess for recurrent PAF. [facility] will [facility] to arrange pick-up.   Brittainy Simmons 05/07/2016

## 2016-05-07 NOTE — Consult Note (Signed)
Patient ID: [name] MRN: [id number], DOB/AGE: [birth year]   Admit date: 05/05/2016   Reason for Consult: ? Anticoagulation Requesting MD: Dr. Zenia Resides, Internal Medicine    Primary Physician: Toma Deiters, MD Primary Cardiologist: Dr. McDowell/ Dr. Excell Seltzer (valve clinic)  Pt. Profile:  82 y/o male with h/o CAD s/p CABG in 2004 with known occlusion of SVG-OM per cath in 2015, h/o severe AS s/p TAVR in 2015, HTN, HLD, CKD and recent mechanical fall resulting in bilateral humerus fractures, treated at outside hospital in [place], transferred to [facility] due to transient atrial fibrillation, acute on chronic renal failure with hyperkalemia and metabolic acidosis. Cardiology ask to evaluate need for anticoagulation given transient atrial fibrillation.   Problem List  Past Medical History:  Diagnosis Date  . Aortic stenosis   . Arthritis   . CKD (chronic kidney disease) stage 3, GFR 30-59 ml/min   . Coronary atherosclerosis of native coronary artery    Multivessel, LVEF 65%  . Essential hypertension, benign   . Mixed hyperlipidemia   . Myocardial infarction   . 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 APPENDECTOMY    . CATARACT EXTRACTION Left 04/27/13   Dr. Lita Mains  . CORONARY ARTERY BYPASS GRAFT     LIMA to LAD, SVG to OM, SVG to RCA, SVG to diagonal 11/04  . EYE SURGERY    . INTRAOPERATIVE TRANSESOPHAGEAL ECHOCARDIOGRAM N/A 11/17/2013   Procedure: INTRAOPERATIVE TRANSESOPHAGEAL ECHOCARDIOGRAM;  Surgeon: [name], MD;  Location: [facility] OR;  Service: Open Heart Surgery;  Laterality: N/A;  . LEFT AND RIGHT HEART CATHETERIZATION WITH CORONARY/GRAFT ANGIOGRAM N/A 10/13/2013   Procedure: LEFT AND RIGHT HEART CATHETERIZATION WITH Isabel Caprice;  Surgeon: Micheline Chapman, MD;  Location: [facility] CATH LAB;   Service: Cardiovascular;  Laterality: N/A;  . Left total knee arthroplasty  04  . TRANSCATHETER AORTIC VALVE REPLACEMENT, TRANSFEMORAL N/A 11/17/2013   Procedure: TRANSCATHETER AORTIC VALVE REPLACEMENT, TRANSFEMORAL;  Surgeon: [name], MD;  Location: [facility] OR;  Service: Open Heart Surgery;  Laterality: N/A;  . UMBILICAL HERNIA REPAIR       Allergies  Allergies  Allergen Reactions  . Lipitor [Atorvastatin Calcium] Other (See Comments)    Leg pain  . Morphine And Related Nausea And Vomiting    HPI  82 y/o male, followed by Dr. Diona Browner, in [place]. He also sees Dr. Excell Seltzer in valve clinic. He has a h/o severe AS, s/p TAVR with a  26 mm Sapien XT valve in May 2015. He also has a h/o CAD s/p CABG in 2004 with LIMA to LAD, SVG to OM, SVG to RCA, SVG to diagonal. His last LHC was in 2015, prior to AVR. This showed severe double vessel coronary artery disease with severe stenosis of the proximal LAD and total occlusion of the native RCA, patency of the left circumflex without significant stenosis.  There was continued patency of the LIMA to LAD, saphenous vein graft to diagonal, and saphenous vein graft to distal RCA. There was total occlusion of the saphenous vein graft to obtuse marginal. His most recent echo 10/2015 showed normal LV systolic function with EF of 60-65%.  Grade 2DD was noted. Wall motion was normal w/o abnormality. His aortic valve prosthesis was stable with mild regurgitation. Additional PMH includes HTN, HLD and CKD. His baseline SCr is ~2.4.  He presented to [facility] as a transfer from an outside hospital in [place], after sustaining a mechanical fall at home, resulting in bilateral humerus fractures. He was seen by an orthopedist at outside hospital who recommended nonoperative approach. His hospitalization in [place] was further complicated by transient atrial fibrillation, acute on chronic renal failure with hyperkalemia, metabolic acidosis and ? elevated troponin. Subsequently, he was  transferred to [facility] for further care and admitted by Internal medicine. Patient is currently in NSR, however given paroxsymal atrial fibrillation, cardiology has been consulted for evaluation to determine need for anticoagulation.   The patient and his wife both note that he was walking on his deck when he tripped over an outdoor rug and loss his footing and fell. There was no LOC. He does not fall often. He is pretty stable on his feet. No balance/ gait abnormalities. He does not ambulate with use of assistive device.  He also denies any prior h/o cardiac arrhthymias. There is no documentation of prior atrial fibrillation/flutter in neither Dr. Ival Bible or Dr. Earmon Phoenix notes. I have reviewed paper chart from outside hospital in [place]. There was mention of paroxsysmal atrial fibrillation in hospital note, but no rhythm strips for review. I have reviewed telemetry since his admission. He has been in NSR. No atrial fibrillation/flutter captured. He has been asymptomatic. His SCr is improving back to his baseline, from as high as 4 this admit to 2.57 (baseline is 2.4). He is still a bit hyperkalemic with K at 5.4. Hgb is low at 8.2. He denies CP and dyspnea. No palpitations. His calculated CHA2DS2 VASc score is at least 4 for (HF, HTN, age >44 and vascular disease).    Home Medications  Prior to Admission medications   Medication Sig Start Date End Date Taking? Authorizing Provider  acetaminophen (TYLENOL) 325 MG tablet Take 650 mg by mouth every 6 (six) hours as needed for mild pain.    Yes Historical Provider, MD  aspirin 81 MG tablet Take 81 mg by mouth daily.     Yes Historical Provider, MD  famotidine (PEPCID) 20 MG tablet Take 20 mg by mouth daily.    Yes Historical Provider, MD  hydrALAZINE (APRESOLINE) 50 MG tablet TAKE ONE & ONE-HALF TABLETS BY MOUTH THREE TIMES DAILY 01/06/16  Yes Jonelle Sidle, MD  Omega-3 Fatty Acids (FISH OIL) 1200 MG CAPS Take 1 capsule by mouth daily.    Yes Historical  Provider, MD  docusate sodium (COLACE) 100 MG capsule Take 100 mg by mouth 2 (two) times daily as needed for mild constipation.    Historical Provider, MD  ergocalciferol (VITAMIN D2) 50000 units capsule Take 50,000 Units by mouth once a week.    Historical Provider, MD  ferrous gluconate (FERGON) 324 MG tablet Take 324 mg by mouth 3 (three) times daily with meals.    Historical Provider, MD  furosemide (LASIX) 20 MG tablet Take 20 mg by mouth.    Historical Provider, MD  gabapentin (NEURONTIN) 100 MG capsule Take 100 mg by mouth 3 (three) times daily.    Historical Provider, MD  isosorbide mononitrate (IMDUR) 30 MG 24 hr tablet Take 30 mg by mouth daily.    Historical Provider, MD  lisinopril (PRINIVIL,ZESTRIL) 10 MG tablet TAKE ONE TABLET BY MOUTH ONCE DAILY **DOSE  DECREASE  01/31/15 Patient not taking: Reported on 05/06/2016 02/10/16   Jonelle Sidle, MD  metoprolol tartrate (LOPRESSOR) 25 MG tablet Take 75 mg by mouth 2 (two) times daily.    Historical  Provider, MD  nitroGLYCERIN (NITROSTAT) 0.4 MG SL tablet Place 1 tablet (0.4 mg total) under the tongue every 5 (five) minutes as needed. 01/31/15   Jonelle SidleSamuel G McDowell, MD  oxyCODONE (OXY IR/ROXICODONE) 5 MG immediate release tablet Take 5 mg by mouth every 8 (eight) hours as needed for severe pain.    Historical Provider, MD  polyethylene glycol (MIRALAX / GLYCOLAX) packet Take 17 g by mouth daily as needed for moderate constipation.    Historical Provider, MD  QUEtiapine (SEROQUEL) 25 MG tablet Take 12.5 mg by mouth at bedtime.    Historical Provider, MD  simvastatin (ZOCOR) 20 MG tablet Take 20 mg by mouth daily.    Historical Provider, MD  sodium bicarbonate 650 MG tablet Take 1,300 mg by mouth 3 (three) times daily.    Historical Provider, MD  tamsulosin (FLOMAX) 0.4 MG CAPS capsule Take 0.4 mg by mouth daily after supper.    Historical Provider, MD   Hospital Meds  . amLODipine  5 mg Oral Daily  . aspirin EC  81 mg Oral Daily  . enoxaparin  (LOVENOX) injection  30 mg Subcutaneous Q24H  . famotidine  20 mg Oral BID  . ferric gluconate (FERRLECIT/NULECIT) IV  125 mg Intravenous QODAY  . furosemide  40 mg Oral BID  . hydrALAZINE  50 mg Oral Q8H  . isosorbide mononitrate  15 mg Oral Daily  . metoprolol  12.5 mg Oral BID  . oxyCODONE-acetaminophen  1 tablet Oral TID  . simvastatin  40 mg Oral q1800  . sodium bicarbonate  650 mg Oral TID   Family History  Family History  Problem Relation Age of Onset  . Cirrhosis Father     Social History  Social History   Social History  . Marital status: Married    Spouse name: N/A  . Number of children: N/A  . Years of education: N/A   Occupational History  . Not on file.   Social History Main Topics  . Smoking status: Never Smoker  . Smokeless tobacco: Never Used     Comment: tobacco use - no  . Alcohol use No  . Drug use: No  . Sexual activity: Not on file   Other Topics Concern  . Not on file   Social History Narrative   Married.      Review of Systems General:  No chills, fever, night sweats or weight changes.  Cardiovascular:  No chest pain, dyspnea on exertion, edema, orthopnea, palpitations, paroxysmal nocturnal dyspnea. Dermatological: No rash, lesions/masses Respiratory: No cough, dyspnea Urologic: No hematuria, dysuria Abdominal:   No nausea, vomiting, diarrhea, bright red blood per rectum, melena, or hematemesis Neurologic:  No visual changes, wkns, changes in mental status. All other systems reviewed and are otherwise negative except as noted above.  Physical Exam  Blood pressure (!) 120/51, pulse (!) 50, temperature 98.2 F (36.8 C), temperature source Oral, resp. rate 18, height 5\' 10"  (1.778 m), weight 199 lb 1.2 oz (90.3 kg), SpO2 99 %.  General: Pleasant, NAD, hard of hearing  Psych: Normal affect. Neuro: Alert and oriented X 3. Moves all extremities spontaneously. HEENT: Normal  Neck: Supple without bruits or JVD. Lungs:  Resp regular and  unlabored, CTA. Heart: RRR no s3, s4, or murmurs. Abdomen: Soft, non-tender, non-distended, BS + x 4.  Extremities: No clubbing, cyanosis or edema. DP/PT/Radials 2+ and equal bilaterally. Bilateral humerus fractures   Labs  Troponin (Point of Care Test) No results for input(s): TROPIPOC in the last  72 hours. No results for input(s): CKTOTAL, CKMB, TROPONINI in the last 72 hours. Lab Results  Component Value Date   WBC 6.9 05/07/2016   HGB 8.2 (L) 05/07/2016   HCT 25.3 (L) 05/07/2016   MCV 93.4 05/07/2016   PLT 211 05/07/2016    Recent Labs Lab 05/07/16 0725  NA 135  K 5.4*  CL 107  CO2 21*  BUN 54*  CREATININE 2.57*  CALCIUM 8.1*  PROT 5.0*  BILITOT 0.9  ALKPHOS 49  ALT 19  AST 20  GLUCOSE 119*   No results found for: CHOL, HDL, LDLCALC, TRIG No results found for: DDIMER   Radiology/Studies  No results found.  ECG  Sinus bradycardia   ASSESSMENT AND PLAN  Active Problems:   Essential hypertension, benign   CORONARY ATHEROSCLEROSIS NATIVE CORONARY ARTERY   Aortic stenosis   CKD (chronic kidney disease) stage 4, GFR 15-29 ml/min (HCC)   S/P TAVR (transcatheter aortic valve replacement)   Atrial fibrillation (HCC)   AKI (acute kidney injury) (HCC)   Normocytic anemia   1. Transient Atrial Fibrillation: this was noted during hospitalization at outside hospital in [place], prior to [facility]. Patient denies prior h/o atrial fib/flutter and no documentation in outpatient records. Since [facility], he has been on telemetry and has remained in NSR. No atrial fibrillation/flutter captured. He has been asymptomatic. Suspect atrial fibrillation was transient in the setting of orthopedic trauma/pain, acute on chronic kidney injury, hyperkalemia and metabolic acidosis. Given transient atrial fibrillation, I don't suspect patient will need oral anticoagulation at this time, unless he has recurrence. Recommend outpatient 30 day monitor to further assess. If recurrence  of atrial fibrillation, would recommend Coumadin given CKD and calculated CHA2DS2 VASc score of at least 4 (HF, HTN, age 48>75 and vascular disease). Could also consider low dose Eliquis, 2.5 mg BID given age 23>80 and SCr > 2.5. Will defer to Dr. Diona BrownerMcDowell, his primary cardiologist, if he has any recurrence.   2. CAD: h/o CABG in 2004 with LIMA to LAD, SVG to OM, SVG to RCA, SVG to diagonal.  LHC in 2015 showed continued patency of the LIMA to LAD, saphenous vein graft to diagonal, and saphenous vein graft to distal RCA. There was total occlusion of the saphenous vein graft to obtuse marginal. He is stable w/o anginal symptomology. Continue medical therapy. ASA, BB, statin and Imdur.   3. H/o Aortic Stenosis: s/p TAVR May 2015 with a 26 mm Sapien XT valve most recent echo 10/2015 showed normal LV systolic function with EF of 60-65%.  Grade 2DD was noted. Wall motion was normal w/o abnormality. His aortic valve prosthesis was stable with mild regurgitation.   4. Mechanical Fall: no LOC. Bilateral humerus fx   5. Bilateral Humerus Fx: seen by ortho at outside hospital. nonoperative approach recommended. He will f/u with his orthopedist after discharge.   6. Acute on Chronic Kidney Disease: SCr improving back to baseline, now at 2.57. Baseline SCr is ~2,4,    Signed, Robbie LisBrittainy Simmons, PA-C 05/07/2016, 1:10 PM   Patient seen, examined. Available data reviewed. Agree with findings, assessment, and plan as outlined by Robbie LisBrittainy Simmons, PA-C. The patient is independently interviewed and examined. I agree with the note as outlined above.  The patient is known to me as he underwent TAVR about 2 years ago. He presents now after mechanical fall with bilateral humerus fractures. His cardiac status appears quite stable. I have reviewed his most recent echocardiogram which shows normal transvalvular  gradients and mild paravalvular aortic insufficiency. This is unchanged from his previous studies.  On examination,  the patient is alert and oriented. He is an elderly gentleman in no distress. He is extremely hard of hearing and difficult to communicate with. Fortunately his wife is at the bedside. Jugular venous pressure is normal. Lungs are clear to auscultation bilaterally. Heart is regular rate and rhythm without murmur or gallop. Abdomen is soft and nontender. Lower extremities have no edema.  Review of telemetry demonstrates sinus rhythm. Outside records are reviewed and the patient did have paroxysms of atrial fibrillation based on reports in the chart. This occurred during his hospitalization with multiple traumatic injuries. He has been in sinus rhythm here. Considering his multiple comorbidities with advanced age, chronic kidney disease, and recent trauma, I think it is best to monitor him at the present time. Once he is discharged from short-term prescription skilled nursing, it would be reasonable to place a 30 day monitor to evaluate for asymptomatic paroxysms of atrial fibrillation. If he in fact has atrial fibrillation, Eliquis 2.5 mg twice daily versus warfarin would likely be his best options. Otherwise his cardiac status is stable and would continue his current medical program. Will arrange follow-up with Dr. Diona Browner.  [name], M.D. 05/07/2016 2:35 PM

## 2016-05-08 LAB — RENAL FUNCTION PANEL
ALBUMIN: 2.6 g/dL — AB (ref 3.5–5.0)
ANION GAP: 10 (ref 5–15)
BUN: 50 mg/dL — ABNORMAL HIGH (ref 6–20)
CALCIUM: 8 mg/dL — AB (ref 8.9–10.3)
CO2: 21 mmol/L — AB (ref 22–32)
CREATININE: 2.64 mg/dL — AB (ref 0.61–1.24)
Chloride: 107 mmol/L (ref 101–111)
GFR, EST AFRICAN AMERICAN: 24 mL/min — AB (ref >60)
GFR, EST NON AFRICAN AMERICAN: 21 mL/min — AB (ref >60)
Glucose, Bld: 95 mg/dL (ref 65–99)
PHOSPHORUS: 3.7 mg/dL (ref 2.5–4.6)
Potassium: 4.9 mmol/L (ref 3.5–5.1)
SODIUM: 138 mmol/L (ref 135–145)

## 2016-05-08 LAB — CBC
HCT: 24.5 % — ABNORMAL LOW (ref 39.0–52.0)
HEMOGLOBIN: 7.9 g/dL — AB (ref 13.0–17.0)
MCH: 29.9 pg (ref 26.0–34.0)
MCHC: 32.2 g/dL (ref 30.0–36.0)
MCV: 92.8 fL (ref 78.0–100.0)
PLATELETS: 218 10*3/uL (ref 150–400)
RBC: 2.64 MIL/uL — AB (ref 4.22–5.81)
RDW: 14 % (ref 11.5–15.5)
WBC: 6.3 10*3/uL (ref 4.0–10.5)

## 2016-05-08 LAB — PREPARE RBC (CROSSMATCH)

## 2016-05-08 MED ORDER — POLYETHYLENE GLYCOL 3350 17 G PO PACK: 17.0000 g | PACK | Freq: Every day | ORAL | 0 refills | Status: DC

## 2016-05-08 MED ORDER — HYDRALAZINE HCL 50 MG PO TABS: 50.0000 mg | ORAL_TABLET | Freq: Three times a day (TID) | ORAL | 1 refills | Status: DC

## 2016-05-08 MED ORDER — SODIUM CHLORIDE 0.9 % IV SOLN
Freq: Once | INTRAVENOUS | Status: AC
  Administered 2016-05-08: 11:00:00 via INTRAVENOUS

## 2016-05-08 MED ORDER — METOPROLOL TARTRATE 25 MG PO TABS: 25.0000 mg | ORAL_TABLET | Freq: Two times a day (BID) | ORAL | 0 refills | Status: AC

## 2016-05-08 MED ORDER — FUROSEMIDE 10 MG/ML IJ SOLN
20.0000 mg | Freq: Once | INTRAMUSCULAR | Status: AC
  Administered 2016-05-08: 20 mg via INTRAVENOUS
  Filled 2016-05-08: qty 2

## 2016-05-08 MED ORDER — SENNA 8.6 MG PO TABS: 1.0000 | ORAL_TABLET | Freq: Two times a day (BID) | ORAL | 0 refills | Status: DC

## 2016-05-08 MED ORDER — FUROSEMIDE 20 MG PO TABS: 40.0000 mg | ORAL_TABLET | Freq: Two times a day (BID) | ORAL | 2 refills | Status: DC

## 2016-05-08 MED ORDER — ISOSORBIDE MONONITRATE ER 30 MG PO TB24
15.0000 mg | ORAL_TABLET | Freq: Every day | ORAL | 0 refills | Status: DC
Start: 2016-05-08 — End: 2016-06-27

## 2016-05-08 MED ORDER — OXYCODONE-ACETAMINOPHEN 5-325 MG PO TABS: 1.0000 | ORAL_TABLET | Freq: Three times a day (TID) | ORAL | 0 refills | Status: DC

## 2016-05-08 MED ORDER — OXYCODONE HCL 5 MG PO TABS: 5.0000 mg | ORAL_TABLET | Freq: Three times a day (TID) | ORAL | 0 refills | Status: DC | PRN

## 2016-05-08 MED ORDER — APIXABAN 2.5 MG PO TABS: 2.5000 mg | ORAL_TABLET | Freq: Two times a day (BID) | ORAL | Status: DC

## 2016-05-08 MED ORDER — APIXABAN 2.5 MG PO TABS
2.5000 mg | ORAL_TABLET | Freq: Two times a day (BID) | ORAL | Status: DC
  Administered 2016-05-08: 2.5 mg via ORAL
  Filled 2016-05-08: qty 1

## 2016-05-08 MED ORDER — AMLODIPINE BESYLATE 5 MG PO TABS: 5.0000 mg | ORAL_TABLET | Freq: Every day | ORAL | 0 refills | Status: DC

## 2016-05-08 NOTE — Progress Notes (Signed)
Pt was c/o his  bladder being full after he voided 350cc in urinal.Bladder scan done results showed over 999cc.MD notified foley inserted with 14 french catheter, pt tolerated well  and voided  800cc in foley bag

## 2016-05-08 NOTE — Clinical Social Work Note (Addendum)
Clinical Social Work Assessment  Patient Details  Name: [name] MRN: [id number] Date of Birth: 1929-06-17  Date of referral:  05/07/16               Reason for consult:  Facility Placement                Permission sought to share information with:  Other (Talked to daughter as patient very HOH) Permission granted to share information::  No  Name::     Rowan BlaseJoyce Tartt and Ward GivensPam Hunsucker  Agency::     Relationship::  Wife and daughter  Contact Information:  Alona BeneJoyce - [phone] and Elita Quickam - [phone]  Housing/Transportation Living arrangements for the past 2 months:  Single Family Home Source of Information:  Adult Children (Daughter Pam) Patient Interpreter Needed:  None Criminal Activity/Legal Involvement Pertinent to Current Situation/Hospitalization:  No - Comment as needed Significant Relationships:  Adult Children, Spouse Lives with:  Spouse Do you feel safe going back to the place where you live?  No (Patient fell at home and per daughter is in agrement with ST rehab before returning home) Need for family participation in patient care:  Yes (Comment)  Care giving concerns:  Family in agreement with SNF placement prior to patient returning home as wife cannot care for patient if he discharges home.  Social Worker assessment / plan:  CSW talked with daughter by phone on 05/07/16 regarding discharge disposition and recommendation of ST rehab. Daughter explained that [facility] and patient was set to discharge to this facility, but had to come to hospital. Ms. Cleone SlimCundiff requested that CSW contact [facility].    Employment status:  Retired Database administratornsurance information:  Managed Medicare ([employer] PPO) PT Recommendations:  Skilled Nursing Facility Information / Referral to community resources:  Skilled Nursing Facility (Family already had facility preference)  Patient/Family's Response to care:  No concerns expressed regarding care during  hospitalization.  Patient/Family's Understanding of and Emotional Response to Diagnosis, Current Treatment, and Prognosis: Not discussed.  Emotional Assessment Appearance:  Appears stated age (CSW looked in on patient but did not engage in conversation as PT was working with him) Attitude/Demeanor/Rapport:  Unable to Assess Affect (typically observed):  Unable to Assess Orientation:  Oriented to Self, Oriented to Place, Oriented to  Time, Oriented to Situation Alcohol / Substance use:  Tobacco Use, Alcohol Use, Illicit Drugs (Patient reported that he has never smoked anbd does not drink or use illicit drugs.) Psych involvement (Current and /or in the community):  No (Comment)  Discharge Needs  Concerns to be addressed:  Discharge Planning Concerns Readmission within the last 30 days:  No Current discharge risk:  None Barriers to Discharge:  No Barriers Identified   Cristobal GoldmannCrawford, Nazario Russom Bradley, LCSW 05/08/2016, 1:16 PM

## 2016-05-08 NOTE — Progress Notes (Signed)
Occupational Therapy Treatment Patient Details Name: [name] MRN: [id number] DOB: March 07, 1929 Today's Date: 05/08/2016    History of present illness Pt presents as transfer from hospital in [place] with bil humeral fxs s/p fall. He was kicking the edge of a rug while on their deck and got his foot caught on the edge.  he fell left into the wall of the house, then subsequently fell onto the deck onto Rt shoulder.  He was transferred to [facility] for further medical work up. PMH significant for HTN, CAD/CABG (2004), TAVR 2015, dyslipidemia and very hard of hearing   OT comments  Educated granddaughter in retrograde massage of L UE. Focus of session on L UE AAROM/AROM and hand and wrist AROM on R.   Follow Up Recommendations  SNF;Supervision/Assistance - 24 hour    Equipment Recommendations       Recommendations for Other Services      Precautions / Restrictions Precautions Precautions: Fall;Shoulder Shoulder Interventions:  (Sarmiento sling R UE, may remove for bathing) Precaution Comments: discontinued L sling, ok for ROM Other Brace/Splint: Sarmiento brace Rt x 6 weeks Restrictions Weight Bearing Restrictions: Yes RUE Weight Bearing: Non weight bearing LUE Weight Bearing: Partial weight bearing LUE Partial Weight Bearing Percentage or Pounds: 5 pounds Other Position/Activity Restrictions: ok to for ROM to L UE, no exercise to R shoulder       Mobility Bed Mobility                  Transfers                      Balance                                   ADL                                                Vision                     Perception     Praxis      Cognition   Behavior During Therapy: WFL for tasks assessed/performed Overall Cognitive Status: Difficult to assess                       Extremity/Trunk Assessment               Exercises General Exercises - Upper  Extremity Shoulder Flexion: Left;5 reps;Supine;AAROM Shoulder ABduction: Left;5 reps;Supine;AAROM Elbow Flexion: AROM;10 reps;Supine;Left Elbow Extension: AROM;Left;10 reps;Supine Wrist Flexion: AROM;Both;10 reps;Supine Wrist Extension: AROM;Both;10 reps;Supine Digit Composite Flexion: AROM;Both;10 reps;Supine Composite Extension: AROM;Both;10 reps;Supine Other Exercises Other Exercises: retrograde massage to R hand, wrist, forearm, elevated B UEs on 2 pillows   Shoulder Instructions       General Comments      Pertinent Vitals/ Pain       Pain Assessment: Faces Faces Pain Scale: Hurts even more Pain Location: L UE Pain Descriptors / Indicators: Aching;Guarding;Grimacing Pain Intervention(s): Patient requesting pain meds-RN notified;Limited activity within patient's tolerance;Monitored during session  Home Living  Prior Functioning/Environment              Frequency  Min 2X/week        Progress Toward Goals  OT Goals(current goals can now be found in the care plan section)  Progress towards OT goals: Progressing toward goals  Acute Rehab OT Goals Patient Stated Goal: get back to his [employer] class Time For Goal Achievement: 05/14/16 Potential to Achieve Goals: Good  Plan Discharge plan remains appropriate    Co-evaluation                 End of Session     Activity Tolerance Patient tolerated treatment well   Patient Left in bed;with call bell/phone within reach;with family/visitor present;with bed alarm set   Nurse Communication Patient requests pain meds        Time: 1000-1046 OT Time Calculation (min): 46 min  Charges: OT General Charges $OT Visit: 1 Procedure OT Treatments $Therapeutic Activity: 8-22 mins $Therapeutic Exercise: 23-37 mins  Evern BioMayberry, Jazmine Longshore Lynn 05/08/2016, 10:49 AM [phone]

## 2016-05-08 NOTE — Progress Notes (Signed)
"  Debbie" at [facility] called, report for patient given. All belongings sent with patient. Family to transport. Discharged with foley in place per MD order.

## 2016-05-08 NOTE — Progress Notes (Signed)
Physical Therapy Treatment Patient Details Name: Lessie DingsJames H Streight MRN: [id number] DOB: 02-24-1929 Today's Date: 05/08/2016    History of Present Illness Pt presents as transfer from hospital in [place] with bil humeral fxs s/p fall. He was kicking the edge of a rug while on their deck and got his foot caught on the edge.  he fell left into the wall of the house, then subsequently fell onto the deck onto Rt shoulder.  He was transferred to [facility] for further medical work up. PMH significant for HTN, CAD/CABG (2004), TAVR 2015, dyslipidemia and very hard of hearing    PT Comments    Mobility improving slowly, Emphasis on sitting up, scooting forward, standing and gait without use of his arms.   Follow Up Recommendations  SNF;Supervision/Assistance - 24 hour     Equipment Recommendations  3in1 (PT)    Recommendations for Other Services       Precautions / Restrictions Precautions Precautions: Fall;Shoulder Precaution Comments: discontinued L sling, ok for ROM Other Brace/Splint: Sarmiento brace Rt x 6 weeks Restrictions Weight Bearing Restrictions: Yes RUE Weight Bearing: Non weight bearing LUE Weight Bearing: Partial weight bearing LUE Partial Weight Bearing Percentage or Pounds: 5 pounds Other Position/Activity Restrictions: okay for ROM to L UE, No exercise to the R shoulder.    Mobility  Bed Mobility Overal bed mobility: Needs Assistance Bed Mobility: Supine to Sit     Supine to sit: Mod assist     General bed mobility comments: assist to bridge to EOB and assist to come straight up.  Transfers Overall transfer level: Needs assistance Equipment used: None Transfers: Sit to/from Stand Sit to Stand: Mod assist         General transfer comment: from bed using momentum  Ambulation/Gait Ambulation/Gait assistance: Min assist Ambulation Distance (Feet): 180 Feet Assistive device: None Gait Pattern/deviations: Step-through pattern   Gait velocity  interpretation: Below normal speed for age/gender General Gait Details: generally steady, but agree with increased lateral excursion.   Stairs            Wheelchair Mobility    Modified Rankin (Stroke Patients Only)       Balance Overall balance assessment: Needs assistance   Sitting balance-Leahy Scale: Fair       Standing balance-Leahy Scale: Fair                      Cognition Arousal/Alertness: Awake/alert Behavior During Therapy: WFL for tasks assessed/performed Overall Cognitive Status: Difficult to assess                      Exercises      General Comments        Pertinent Vitals/Pain Pain Assessment: Faces Faces Pain Scale: Hurts little more Pain Location: UE's with motion Pain Descriptors / Indicators: Aching;Guarding;Grimacing Pain Intervention(s): Monitored during session;Repositioned    Home Living                      Prior Function            PT Goals (current goals can now be found in the care plan section) Acute Rehab PT Goals Patient Stated Goal: get back to his [employer] class PT Goal Formulation: With patient/family Time For Goal Achievement: 05/15/16 Potential to Achieve Goals: Fair Progress towards PT goals: Progressing toward goals    Frequency    Min 3X/week      PT Plan  Co-evaluation             End of Session Equipment Utilized During Treatment: Gait belt Activity Tolerance: Patient tolerated treatment well Patient left: in chair;with call bell/phone within reach     Time: 1424-1454 PT Time Calculation (min) (ACUTE ONLY): 30 min  Charges:  $Gait Training: 8-22 mins $Therapeutic Activity: 8-22 mins                    G Codes:      Chesnie Capell, Eliseo GumKenneth V 05/08/2016, 5:58 PM  05/08/2016  Hunter BingKen Adell Panek, PT [phone]  (pager)

## 2016-05-08 NOTE — Progress Notes (Signed)
[facility] Progress Note   Subjective:   Had urinary retention requiring foley catheterization this morning with post-void residual of 800 cc.    Objective:   BP (!) 136/51 (BP Location: Left Leg)   Pulse 70   Temp 97.7 F (36.5 C) (Oral)   Resp 16   Ht 5\' 10"  (1.778 m)   Wt 199 lb 1.2 oz (90.3 kg)   SpO2 100%   BMI 28.56 kg/m   Intake/Output Summary (Last 24 hours) at 05/08/16 0751 Last data filed at 05/08/16 0602  Gross per 24 hour  Intake              840 ml  Output             2200 ml  Net            -1360 ml   Net negative 3.0L since admission.   Weight change:   Physical Exam: General: Elderly male, resting in bed. Very pleasant. Extremely hard of hearing HEENT: MMM CVA: Irregular but not tachycardic. No m/r/g or LE edema, bilateral mild hip edema and swollen upper extremities, no JVD Lung: CTAB, no increased WOB  Abdomen: Soft, NT, ND  Extremities: Swollen hands and upper arms, brace R right and slings bilaterally Neurologic: AOx3, follows commands Skin: Ecchymoses of upper extremities    Recent Labs Lab 05/05/16 2123 05/06/16 0540 05/07/16 0725 05/07/16 1659 05/08/16 0528  NA 134*  --  135 136 138  K 4.7  --  5.4* 5.1 4.9  CL 109  --  107 106 107  CO2 19*  --  21* 23 21*  GLUCOSE 149*  --  119* 110* 95  BUN 64*  --  54* 52* 50*  CREATININE 2.65*  --  2.57* 2.68* 2.64*  CALCIUM 8.0*  --  8.1* 8.0* 8.0*  PHOS 3.2 3.6 4.3  --  3.7     Recent Labs Lab 05/05/16 2123 05/07/16 0725 05/08/16 0528  WBC 6.5 6.9 6.3  HGB 8.3* 8.2* 7.9*  HCT 24.9* 25.3* 24.5*  MCV 92.9 93.4 92.8  PLT 190 211 218    Medications:    . sodium chloride   Intravenous Once  . amLODipine  5 mg Oral Daily  . aspirin EC  81 mg Oral Daily  . enoxaparin (LOVENOX) injection  30 mg Subcutaneous Q24H  . famotidine  20 mg Oral BID  . ferric gluconate (FERRLECIT/NULECIT) IV  125 mg Intravenous QODAY  . furosemide  20 mg Intravenous Once  . furosemide  40  mg Oral BID  . hydrALAZINE  50 mg Oral Q8H  . isosorbide mononitrate  15 mg Oral Daily  . metoprolol  12.5 mg Oral BID  . oxyCODONE-acetaminophen  1 tablet Oral TID  . simvastatin  40 mg Oral q1800  . sodium bicarbonate  650 mg Oral TID     Background: [name] is an 82 y.o. male with PMH of hypertension, CAD/CABG (2004), TAVR 2015, hyperlipidemia, CKD stage IV, and very hard of hearing. He is a transfer from OSH in [place] after mechanical fall that resulted in bilateral humerus fractures and had hyperkalemia and AoCKD, requiring further monitoring. SCr was near baseline of 2.4-2.6 upon [facility] but had been as high as 4.28 on 04/29/16. Believe he is now at baseline and appropriate for discharge to SNF. Nephrology signing off. Please see summary recommendations in bold below.   Assessment/ Plan:    Acute on Chronic CKD: Remains at baseline.  Perhaps had ATN in the setting of hypovolemia with blood loss and dehydration. Eating some of his meals but drinking well. Good UOP of 3.0L. Continue sodium bicarb 650 mg TID. Will need to establish with outpatient Nephrologist in [place].  Hyperkalemia: Improved after change to renal diet and kayexalate. Would recommend close monitoring of potassium initially when discharged to SNF.  Metabolic acidosis: Resolved. No lactic acidosis on admission to [facility]. Likely secondary to uremia, chronic renal failure. Volume: Was volume overloaded, now net negative. Will decrease lasix from IV 40 mg BID to PO 40 mg BID. Still with upper extremity dependent edema, JVD (resolved). Continue lasix, at increased PO dose of 40 mg BID, taper as needed (was on low dose 20 mg daily at home). Daily weights at SNF. Hypoalbuminemia: Would recommend nepro if supplements are used. Anemia: Stable but slightly decreased at 7.9. Primary team ordered 1 U pRBC transfusion. Hgb as low as 7.3 10/27 with hemorrhaging into arms; had 2U pRBCs 10/25. Hgb 10.4 on 10/25. Started on iron  supplementation and EPO at OSH. S/p 1 dose of darbepoetin and IV iron while hospitalized. Would continue aranesp 100 mcg weekly (last dose given 11/6) and give ferrous sulfate 325 mg BID upon discharge. Would discontinue aranesp once hemoglobin is above 10.0.  Urinary retention: Required foley catheter placement at OSH, removed 11/2 and again this morning. No known history of BPH. Continue flomax. Discontinue foley in 24 hours with voiding trial. Will need outpatient Urology referral.  HTN: Improved. Continue to hold home lisinopril. Continue imdur 15 mg, metoprolol 12.5 mg BID, hydralazine 50 mg TID. Bradycardia: Continue decreased dose of metoprolol 12.5 mg BID.  PAF noted during outside hospitalization: Cardiology has evaluated. No further episodes since [facility] to [facility] and suspect in setting of acute stress. Would discharge with 30-day monitor and consider eliquis vs coumadin if does have afib.  Encephalopathy: Now resolved. Thought to be 2/2 hospital acquired delirium (had 5 room changes per daughter). Does not appear uremic on exam and is now AOx3. Continue delirium precautions, e.g., lights on during day, wear hearing aids.   Dani GobbleHillary Fitzgerald, MD [facility], PGY-2 05/08/2016, 7:51 AM   I have seen and examined this patient and agree with plan and assessment in the above note with renal recommendations/intervention highlighted. Note plans for transfer to an outside rehab facility.  All renal recommendations as highlighted.  Chi Woodham B,MD 05/08/2016 2:08 PM

## 2016-05-08 NOTE — Progress Notes (Signed)
    Subjective:  The patient feeling well. Granddaughter at bedside. He denies chest pain or shortness of breath.  Objective:  Vital Signs in the last 24 hours: Temp:  [97.5 F (36.4 C)-98.2 F (36.8 C)] 98.2 F (36.8 C) (11/07 1000) Pulse Rate:  [50-72] 72 (11/07 1000) Resp:  [16-22] 22 (11/07 1000) BP: (120-145)/(49-58) 141/49 (11/07 1000) SpO2:  [97 %-100 %] 97 % (11/07 1000)  Intake/Output from previous day: 11/06 0701 - 11/07 0700 In: 840 [P.O.:840] Out: 3000 [Urine:3000]  Physical Exam: Pt is alert and oriented, NAD HEENT: normal Neck: JVP - normal Lungs: CTA bilaterally CV: Irregularly irregular without murmur or gallop Abd: soft, NT, Positive BS, no hepatomegaly Ext: no C/C/E in the lower extremities, the right arm has diffuse 2+ edema. distal pulses intact and equal Skin: warm/dry no rash   Lab Results:  Recent Labs  05/07/16 0725 05/08/16 0528  WBC 6.9 6.3  HGB 8.2* 7.9*  PLT 211 218    Recent Labs  05/07/16 1659 05/08/16 0528  NA 136 138  K 5.1 4.9  CL 106 107  CO2 23 21*  GLUCOSE 110* 95  BUN 52* 50*  CREATININE 2.68* 2.64*   No results for input(s): TROPONINI in the last 72 hours.  Invalid input(s): CK, MB  Tele: Atrial fibrillation with controlled ventricular response. The heart rate is in the 60s and 70s.  Assessment/Plan:  1. Paroxysmal atrial fibrillation with CHADS-Vasc of 4. The patient is in atrial fibrillation today based on review of his telemetry. His heart rate is well controlled and he is asymptomatic. Anticoagulation is clearly indicated. Will discontinue aspirin and start apixaban 2.5 mg twice daily based on his advanced age and reduced creatinine clearance. Will place a case management consultation for medication cost evaluation.  2. CAD status post CABG: No anginal symptoms. Continue current management.  3. Aortic valve disease status post TAVR: Normal function of his transcatheter heart valve bioprosthesis.  4. Acute  on chronic kidney injury: Appears stable. Management per primary team.  Disposition: Patient to receive 1 unit of packed red blood cells today. His hemoglobin has been hovering around 8 mg/dL. From a cardiac perspective he is ready for discharge. 30 day monitor was anticipated, but now that atrial fibrillation has been clearly demonstrated a monitor is no longer necessary. Will cancel this request.  Tonny Bollmanooper, Syd Newsome, M.D. 05/08/2016, 10:13 AM

## 2016-05-08 NOTE — [place] FL2 (Signed)
[place] MEDICAID FL2 LEVEL OF CARE SCREENING TOOL     IDENTIFICATION  Patient Name: Craig GitelmanJames H Hayes Birthdate: March 03, 1929 Sex: male Admission Date (Current Location): 05/05/2016  [facility] [place]:   ([place] from [place]   Facility and Address:  [facility] [place], [address], [place], [place] [address]      Provider Number: [id number]  Attending Physician Name and Address:  Osvaldo ShipperGokul Krishnan, MD  Relative Name and Phone Number:  Rowan BlaseJoyce Johannesen (spouse) [phone] and Ward Givensam Pappalardo (daughter) [phone]    Current Level of Care: SNF Recommended Level of Care: Skilled Nursing Facility Prior Approval Number:    Date Approved/Denied:   PASRR Number: [id number] (Eff. 04/26/16)  Discharge Plan: SNF    Current Diagnoses: Patient Active Problem List   Diagnosis Date Noted  . Normocytic anemia 05/06/2016  . AKI (acute kidney injury) (HCC) 05/05/2016  . Bilateral leg edema 01/14/2014  . Encounter for therapeutic drug monitoring 11/25/2013  . Constipation 11/20/2013  . Atrial fibrillation (HCC) 11/19/2013  . Aortic valve disorders 11/17/2013  . S/P TAVR (transcatheter aortic valve replacement) 11/17/2013  . Aortic stenosis 08/18/2009  . Essential hypertension, benign 03/10/2009  . CORONARY ATHEROSCLEROSIS NATIVE CORONARY ARTERY 03/10/2009  . CKD (chronic kidney disease) stage 4, GFR 15-29 ml/min (HCC) 03/10/2009  . HYPERLIPIDEMIA-MIXED 03/01/2009    Orientation RESPIRATION BLADDER Height & Weight     Self, Time, Situation  Normal Continent Weight: 199 lb 1.2 oz (90.3 kg) Height:  5\' 10"  (177.8 cm)  BEHAVIORAL SYMPTOMS/MOOD NEUROLOGICAL BOWEL NUTRITION STATUS      Continent Diet (Renal with 1200 mL fluid restriction)  AMBULATORY STATUS COMMUNICATION OF NEEDS Skin   Extensive Assist Verbally Other (Comment), Skin abrasions (Ecchymosis to arm, back, hip and shoulder.  Skin tear to right elbow.)                        Personal Care Assistance Level of Assistance  Bathing, Feeding, Dressing Bathing Assistance: Maximum assistance Feeding assistance: Limited assistance Dressing Assistance: Maximum assistance     Functional Limitations Info  Sight, Hearing, Speech Sight Info: Adequate Hearing Info: Impaired (Patient very hard of hearing) Speech Info: Adequate    SPECIAL CARE FACTORS FREQUENCY  PT (By licensed PT), OT (By licensed OT)     PT Frequency: Evaluated 11/5 and a minimum of 3X per week therapy recommended OT Frequency: Evaluated 11/6 and a minimum of 2X per week therapy recommended            Contractures Contractures Info: Not present    Additional Factors Info  Code Status, Allergies Code Status Info: Full Allergies Info: Lipitor           Current Medications (05/08/2016):  This is the current hospital active medication list Current Facility-Administered Medications  Medication Dose Route Frequency Provider Last Rate Last Dose  . acetaminophen (TYLENOL) tablet 650 mg  650 mg Oral Q6H PRN Osvaldo ShipperGokul Krishnan, MD      . amLODipine (NORVASC) tablet 5 mg  5 mg Oral Daily Osvaldo ShipperGokul Krishnan, MD   5 mg at 05/08/16 1106  . apixaban (ELIQUIS) tablet 2.5 mg  2.5 mg Oral BID Osvaldo ShipperGokul Krishnan, MD   2.5 mg at 05/08/16 1107  . famotidine (PEPCID) tablet 20 mg  20 mg Oral BID Osvaldo ShipperGokul Krishnan, MD   20 mg at 05/08/16 1108  . ferric gluconate (NULECIT) 125 mg in sodium chloride 0.9 % 100 mL IVPB  125 mg Intravenous  Claude MangesQODAY Robert Schertz, MD   125 mg at 05/06/16 1439  . furosemide (LASIX) injection 20 mg  20 mg Intravenous Once Osvaldo ShipperGokul Krishnan, MD      . furosemide (LASIX) tablet 40 mg  40 mg Oral BID Delano Metzobert Schertz, MD   40 mg at 05/08/16 0839  . hydrALAZINE (APRESOLINE) tablet 50 mg  50 mg Oral Q8H Delano Metzobert Schertz, MD   50 mg at 05/08/16 0512  . isosorbide mononitrate (IMDUR) 24 hr tablet 15 mg  15 mg Oral Daily Vista MinkMatthew W Wilson, MD   15 mg at 05/08/16 1109  . metoprolol tartrate (LOPRESSOR) tablet 12.5  mg  12.5 mg Oral BID Camille Balynthia Dunham, MD   12.5 mg at 05/08/16 1110  . ondansetron (ZOFRAN) injection 4 mg  4 mg Intravenous Q6H PRN Osvaldo ShipperGokul Krishnan, MD   4 mg at 05/07/16 0825  . oxyCODONE (Oxy IR/ROXICODONE) immediate release tablet 5-10 mg  5-10 mg Oral Q4H PRN Osvaldo ShipperGokul Krishnan, MD   5 mg at 05/08/16 0428  . oxyCODONE-acetaminophen (PERCOCET/ROXICET) 5-325 MG per tablet 1 tablet  1 tablet Oral TID Osvaldo ShipperGokul Krishnan, MD   1 tablet at 05/07/16 2224  . simvastatin (ZOCOR) tablet 40 mg  40 mg Oral q1800 Vista MinkMatthew W Wilson, MD   40 mg at 05/07/16 1707  . sodium bicarbonate tablet 650 mg  650 mg Oral TID Vista MinkMatthew W Wilson, MD   650 mg at 05/08/16 1111     Discharge Medications: Please see discharge summary for a list of discharge medications.  Relevant Imaging Results:  Relevant Lab Results:   Additional Information ss#[id number].  Patient has bilateral humerus fractures and has a Sarmiento brace and sling on right arm.  Cristobal Goldmannrawford, Dhruvan Gullion Bradley, LCSW

## 2016-05-08 NOTE — Clinical Social Work Placement (Signed)
   CLINICAL SOCIAL WORK PLACEMENT  NOTE 05/08/16 - DISCHARGED TO [facility] TRANSPORTED BY FAMILY  Date:  05/08/2016  Patient Details  Name: [name] MRN: [id number] Date of Birth: March 13, 1929  Clinical Social Work is seeking post-discharge placement for this patient at the Skilled  Nursing Facility level of care (*CSW will initial, date and re-position this form in  chart as items are completed):  No (Family has facility preference)   Patient/family provided with [facility] Clinical Social Work Department's list of facilities offering this level of care within the geographic area requested by the patient (or if unable, by the patient's family).  Yes   Patient/family informed of their freedom to choose among providers that offer the needed level of care, that participate in Medicare, Medicaid or managed care program needed by the patient, have an available bed and are willing to accept the patient.  Yes   Patient/family informed of [facility] ownership interest in [facility] and [facility], as well as of the fact that they are under no obligation to receive care at these facilities.  PASRR submitted to EDS on       PASRR number received on       Existing PASRR number confirmed on 05/07/16     FL2 transmitted to all facilities in geographic area requested by pt/family on 05/07/16     FL2 transmitted to all facilities within larger geographic area on       Patient informed that his/her managed care company has contracts with or will negotiate with certain facilities, including the following:        Yes   Patient/family informed of bed offers received.  Patient chooses bed at [facility]     Physician recommends and patient chooses bed at      Patient to be transferred [facility] on 05/08/16.  Patient to be transferred to facility by Family     Patient family notified on 05/08/16 of transfer.  Name of family member notified:   Daughter Ward Givensam Torok     PHYSICIAN      Additional Comment:  05/08/16 - CSW informed by Elease HashimotoPatricia, admissions director at [facility] that she received authorization for patient from [facility] Medicare PPO.   _______________________________________________ Cristobal Goldmannrawford, Kamrin Spath Bradley, LCSW 05/08/2016, 3:19 PM

## 2016-05-08 NOTE — Care Management Note (Signed)
Case Management Note  Patient Details  Name: Lessie DingsJames H Tuite MRN: [id number] Date of Birth: [birth year]  Subjective/Objective:     CM following for progression and d/c planning.               Action/Plan: 05/08/2016 Noted CM consult for Eliquis, this pt will be d/c to SNF, will contact SNF to determine if they can use the Eliquis card.   Expected Discharge Date:                  Expected Discharge Plan:  Skilled Nursing Facility  In-House Referral:  Clinical Social Work  Discharge planning Services  Medication Assistance  Post Acute Care Choice:  NA Choice offered to:  NA  DME Arranged:  N/A DME Agency:     HH Arranged:  NA HH Agency:  NA  Status of Service:  In process, will continue to follow  If discussed at Long Length of Stay Meetings, dates discussed:    Additional Comments:  Starlyn SkeansRoyal, Viridiana Spaid U, RN 05/08/2016, 12:13 PM

## 2016-05-08 NOTE — Discharge Summary (Signed)
[facility]  Physician Discharge Summary   Patient ID: [name] MRN: [id number] DOB/AGE: September 28, 1928 82 y.o.  Admit date: 05/05/2016 Discharge date: 05/08/2016  PCP: Toma Deiters, MD  DISCHARGE DIAGNOSES:  Active Problems:   Essential hypertension, benign   CORONARY ATHEROSCLEROSIS NATIVE CORONARY ARTERY   Aortic stenosis   CKD (chronic kidney disease) stage 4, GFR 15-29 ml/min (HCC)   S/P TAVR (transcatheter aortic valve replacement)   Atrial fibrillation (HCC)   AKI (acute kidney injury) (HCC)   Normocytic anemia   RECOMMENDATIONS FOR OUTPATIENT FOLLOW UP: CBC and basic metabolic panel on Friday. Please make sure that the patient keeps his appointment with the orthopedic doctor, Dr. Case on November 14.  He needs to continue to wear the brace on the right upper extremity.  Ongoing PT evaluation.  Foley catheter needs to stay in place till patient has achieved increase mobility. Voiding trial can be given later this week or early next week.  DISCHARGE CONDITION: fair  Diet recommendation: Renal diet  Filed Weights   05/05/16 1927 05/06/16 2150  Weight: 85.5 kg (188 lb 7.9 oz) 90.3 kg (199 lb 1.2 oz)    INITIAL HISTORY: Patient transferred from [facility] in [place]. This is a 82 year old Caucasian male with a past medical history of aortic valve stenosis, status post TAVR, hypertension, coronary artery disease, hyperlipidemia, chronic kidney disease who was admitted to an outside facility after sustaining a mechanical fall at home resulting in bilateral humerus fractures. His stay in the hospital in [place] was complicated by transient atrial fibrillation, acute on chronic renal failure with hyperkalemia and elevated troponin. Patient also was noted have metabolic acidosis. All these issues appear to be stabilizing. However, the patient's daughter then requested transfer to [facility] for further evaluation.  Consultations:  Nephrology and  cardiology   HOSPITAL COURSE:   Bilateral humerus fractures X-rays at outside hospital showed right closed humeral shaft fracture and impacted left proximal humerus fracture. Patient has been evaluated by orthopedic surgery at outside hospital; they were planning for nonoperative approach. Patient is currently in a brace on the right. Discussed with the patient's daughter. Patient is going to follow-up with an orthopedic doctor in [place], Dr. Case on November 14. No clear reason to repeat imaging studies during this hospitalization. He actually had x-rays repeated on November 4 at the outside facility. Reports are available in the physical chart. Patient was seen by physical therapy. Rehabilitation was recommended at the skilled nursing facility.   CAD / recent type 2 MI  Patient has underlining coronary artery disease and underwent CABG in 2004. Hospital course at [facility] was complicated by type II MI with elevation in troponin secondary to demand ischemia. Cardiology has evaluated the patient at the other hospital. There was no plan for any intervention. Cardiology has been consulted to see the patient here as well.   Persistent atrial fibrillation This is also noted as a complication at outside hospital. Patient converted to sinus rhythm. He was seen by cardiology there. He was maintained on his beta blocker. Overnight last night patient converted back to atrial fibrillation. Rate is very well controlled. Discussed with Dr. Excell Seltzer this morning. Recommends initiating anticoagulation. He'll be started on Apixaban at lower dose due to age and renal function. Patient has a history of coronary artery disease. He has had aortic valve replacement as well. Last echocardiogram from April 2017 showed normal systolic function with grade 2 diastolic dysfunction. Chads 2 vascular score is 4. He has  complicated cardiac history. Okay to stop aspirin.  AKI in the setting of CKD with non-anion gap metabolic  acidosis Patient was noted to have non-anion gap metabolic acidosis and mild hyperkalemia at outside hospital. Did not require hemodialysis. Nephrology already evaluated patient at outside hospital. Nephrology was consulted here as well. Patient did have hyperkalemia for which she was given Kayexalate. Potassium is now normal. Recommend checking renal function later this week at the skilled nursing facility. Continue Lasix at the higher dose as recommended by nephrology. His creatinine is stable. He has good urine output.   Acute Urinary retention Patient has had difficulty urinating, possibly due to mobility issues. Foley catheter was placed early morning today. We will plan to leave it in for now. As his mobility improves a voiding trial can be attempted at the skilled nursing facility later this week. Continue Flomax for now. If he continues to have issues urinating despite improvement in mobility, then he will need to see a urologist.    Essential hypertension Blood pressure was very poorly controlled. Amlodipine was added with which Blood pressure appears to be better controlled now. Continue his other medications as well as mentioned below.  Recent encephalopathy secondary to hospital acquired delirium  This was also noted at outside hospital. Patient remains mildly distracted. No focal deficits are noted. Patient was on multiple psychotropic medications at the outside facility, all of which have been held for now. Monitor at the skilled nursing facility.   Normocytic Anemia,  Patient was found to have borderline iron deficiency anemia; this is likely secondary to see CKD. Patient underwent CT imaging of the abdomen and pelvis and this did not show a source of bleeding. Hemoglobin did drop to 7.9 this morning. Due to his history of heart disease and atrial fibrillation he might benefit from a slightly higher hemoglobin. He'll be transfused 1 unit of blood today before discharge.  Overall,  stable. Okay for discharge to skilled nursing facility after blood transfusion.    PERTINENT LABS:  The results of significant diagnostics from this hospitalization (including imaging, microbiology, ancillary and laboratory) are listed below for reference.    Microbiology: Recent Results (from the past 240 hour(s))  Urine culture     Status: None   Collection Time: 05/06/16  3:52 AM  Result Value Ref Range Status   Specimen Description URINE, RANDOM  Final   Special Requests NONE  Final   Culture NO GROWTH  Final   Report Status 05/07/2016 FINAL  Final     Labs: Basic Metabolic Panel:  Recent Labs Lab 05/05/16 2123 05/06/16 0540 05/07/16 0725 05/07/16 1659 05/08/16 0528  NA 134*  --  135 136 138  K 4.7  --  5.4* 5.1 4.9  CL 109  --  107 106 107  CO2 19*  --  21* 23 21*  GLUCOSE 149*  --  119* 110* 95  BUN 64*  --  54* 52* 50*  CREATININE 2.65*  --  2.57* 2.68* 2.64*  CALCIUM 8.0*  --  8.1* 8.0* 8.0*  MG 2.3 2.4 2.2  --   --   PHOS 3.2 3.6 4.3  --  3.7   Liver Function Tests:  Recent Labs Lab 05/05/16 2123 05/07/16 0725 05/08/16 0528  AST 19 20  --   ALT 15* 19  --   ALKPHOS 45 49  --   BILITOT 0.8 0.9  --   PROT 5.0* 5.0*  --   ALBUMIN 2.4* 2.5* 2.6*   CBC:  Recent Labs Lab 05/05/16 2123 05/07/16 0725 05/08/16 0528  WBC 6.5 6.9 6.3  HGB 8.3* 8.2* 7.9*  HCT 24.9* 25.3* 24.5*  MCV 92.9 93.4 92.8  PLT 190 211 218     IMAGING STUDIES No results found.  DISCHARGE EXAMINATION: Vitals:   05/08/16 1000 05/08/16 1101 05/08/16 1110 05/08/16 1135  BP: (!) 141/49 (!) 157/52 (!) 141/49 (!) 143/41  Pulse: 72 74 72 65  Resp: (!) 22 18  18   Temp: 98.2 F (36.8 C) 98.5 F (36.9 C)  98.2 F (36.8 C)  TempSrc: Oral Oral  Oral  SpO2: 97% 97%  97%  Weight:      Height:       General appearance: alert, distracted and no distress Resp: clear to auscultation bilaterally Cardio: S1, S2 is irregularly irregular.  GI: soft, non-tender; bowel sounds  normal; no masses,  no organomegaly Extremities: extremities normal, atraumatic, no cyanosis or edema  DISPOSITION: SNF  Discharge Instructions    Call MD for:  difficulty breathing, headache or visual disturbances    Complete by:  As directed    Call MD for:  extreme fatigue    Complete by:  As directed    Call MD for:  persistant dizziness or light-headedness    Complete by:  As directed    Call MD for:  persistant nausea and vomiting    Complete by:  As directed    Call MD for:  severe uncontrolled pain    Complete by:  As directed    Call MD for:  temperature >100.4    Complete by:  As directed    Discharge instructions    Complete by:  As directed    CBC and basic metabolic panel on Friday. Please make sure that the patient keeps his appointment with the orthopedic doctor, Dr. Case on November 14. He needs to continue to wear the brace on the right upper extremity. Ongoing PT evaluation. Foley catheter needs to stay in place till patient has achieved increase mobility. Voiding trial can be given later this week or early next week.  You were cared for by a hospitalist during your hospital stay. If you have any questions about your discharge medications or the care you received while you were in the hospital after you are discharged, you can call the unit and asked to speak with the hospitalist on call if the hospitalist that took care of you is not available. Once you are discharged, your primary care physician will handle any further medical issues. Please note that NO REFILLS for any discharge medications will be authorized once you are discharged, as it is imperative that you return to your primary care physician (or establish a relationship with a primary care physician if you do not have one) for your aftercare needs so that they can reassess your need for medications and monitor your lab values. If you do not have a primary care physician, you can call [phone] for a physician  referral.   Increase activity slowly    Complete by:  As directed       ALLERGIES:  Allergies  Allergen Reactions  . Lipitor [Atorvastatin Calcium] Other (See Comments)    Leg pain  . Morphine And Related Nausea And Vomiting     Current Discharge Medication List    START taking these medications   Details  amLODipine (NORVASC) 5 MG tablet Take 1 tablet (5 mg total) by mouth daily. Qty: 30 tablet, Refills: 0  apixaban (ELIQUIS) 2.5 MG TABS tablet Take 1 tablet (2.5 mg total) by mouth 2 (two) times daily. Qty: 60 tablet    oxyCODONE-acetaminophen (PERCOCET/ROXICET) 5-325 MG tablet Take 1 tablet by mouth 3 (three) times daily. Qty: 30 tablet, Refills: 0    senna (SENOKOT) 8.6 MG TABS tablet Take 1 tablet (8.6 mg total) by mouth 2 (two) times daily. Qty: 120 each, Refills: 0      CONTINUE these medications which have CHANGED   Details  furosemide (LASIX) 20 MG tablet Take 2 tablets (40 mg total) by mouth 2 (two) times daily. Qty: 60 tablet, Refills: 2    hydrALAZINE (APRESOLINE) 50 MG tablet Take 1 tablet (50 mg total) by mouth 3 (three) times daily. Qty: 90 tablet, Refills: 1    isosorbide mononitrate (IMDUR) 30 MG 24 hr tablet Take 0.5 tablets (15 mg total) by mouth daily. Qty: 30 tablet, Refills: 0    metoprolol tartrate (LOPRESSOR) 25 MG tablet Take 1 tablet (25 mg total) by mouth 2 (two) times daily. Qty: 60 tablet, Refills: 0    oxyCODONE (OXY IR/ROXICODONE) 5 MG immediate release tablet Take 1-2 tablets (5-10 mg total) by mouth every 8 (eight) hours as needed for severe pain. Qty: 30 tablet, Refills: 0    polyethylene glycol (MIRALAX / GLYCOLAX) packet Take 17 g by mouth daily. Qty: 14 each, Refills: 0      CONTINUE these medications which have NOT CHANGED   Details  acetaminophen (TYLENOL) 325 MG tablet Take 650 mg by mouth every 6 (six) hours as needed for mild pain.     famotidine (PEPCID) 20 MG tablet Take 20 mg by mouth daily.     Omega-3 Fatty  Acids (FISH OIL) 1200 MG CAPS Take 1 capsule by mouth daily.     ergocalciferol (VITAMIN D2) 50000 units capsule Take 50,000 Units by mouth once a week.    ferrous gluconate (FERGON) 324 MG tablet Take 324 mg by mouth 3 (three) times daily with meals.    nitroGLYCERIN (NITROSTAT) 0.4 MG SL tablet Place 1 tablet (0.4 mg total) under the tongue every 5 (five) minutes as needed. Qty: 25 tablet, Refills: 3    simvastatin (ZOCOR) 20 MG tablet Take 20 mg by mouth daily.    sodium bicarbonate 650 MG tablet Take 1,300 mg by mouth 3 (three) times daily.    tamsulosin (FLOMAX) 0.4 MG CAPS capsule Take 0.4 mg by mouth daily after supper.      STOP taking these medications     aspirin 81 MG tablet      docusate sodium (COLACE) 100 MG capsule      gabapentin (NEURONTIN) 100 MG capsule      lisinopril (PRINIVIL,ZESTRIL) 10 MG tablet      QUEtiapine (SEROQUEL) 25 MG tablet          Follow-up Information    Case, Helen HashimotoSteven L, MD Follow up.   Specialty:  Orthopedic Surgery Why:  as scheduled for 05/15/16 4. Bilateral humeral fractures Contact information: [address] [place] [address] [phone]        Toma DeitersXAJE A HASANAJ, MD. Schedule an appointment as soon as possible for a visit in 1 week(s).   Specialty:  Internal Medicine Contact information: [address] [place] [id number] [phone]           TOTAL DISCHARGE TIME: 35 minutes  [facility] Bone [facility] Joint InstituteKRISHNAN,Jamielee Mchale  [facility] Pager [phone]  05/08/2016, 12:05 PM

## 2016-05-09 LAB — TYPE AND SCREEN
ABO/RH(D): O POS
ANTIBODY SCREEN: NEGATIVE
UNIT DIVISION: 0

## 2016-05-10 ENCOUNTER — Telehealth: Payer: Self-pay | Admitting: Cardiovascular Disease

## 2016-05-10 NOTE — Telephone Encounter (Signed)
30 day event monitor ordered per Dr. Excell Seltzerooper - pt aware that monitor was ordered today, pt also made f/u appt with Dr. Diona BrownerMcDowell 12/27

## 2016-05-10 NOTE — Telephone Encounter (Signed)
Would like to verify company that monitor will be coming from  Received a call this morning.

## 2016-05-16 ENCOUNTER — Encounter: Payer: Self-pay | Admitting: Cardiology

## 2016-05-21 ENCOUNTER — Telehealth: Payer: Self-pay | Admitting: *Deleted

## 2016-05-21 ENCOUNTER — Encounter: Payer: Self-pay | Admitting: *Deleted

## 2016-05-21 NOTE — Telephone Encounter (Signed)
Family made aware through MyChart message

## 2016-05-21 NOTE — Telephone Encounter (Signed)
-----   Message from Albertine PatriciaStaci T Oretta Berkland, CMA sent at 05/21/2016  1:50 PM EST -----   ----- Message ----- From: Albertine PatriciaStaci T Jaylanie Boschee, CMA Sent: 05/17/2016   9:44 AM To: Jonelle SidleSamuel G McDowell, MD  Thank you ----- Message ----- From: Jonelle SidleSamuel G McDowell, MD Sent: 05/17/2016   9:34 AM To: Albertine PatriciaStaci T Laurana Magistro, CMA  Dr. Excell Seltzerooper told me that the reason for the event monitor in the first place was to actually document whether he had any definitive atrial fibrillation. Prior to his discharge from the hospital, he did have atrial fibrillation documented and this is why the monitor was canceled and they started him on Eliquis. Would go with that same plan. He does not need the monitor now.  ----- Message ----- From: Albertine PatriciaStaci T Nasif Bos, CMA Sent: 05/17/2016   9:20 AM To: Jonelle SidleSamuel G McDowell, MD  Dr. Excell Seltzerooper asked for 30 day event monitor on this pt on 11/7 that I ordered that day, however the d/c summary says he was cx this since pt HR was controled. Started Eliquis 2.5 mg bid and has f/u with you in December. The family has left me a message asking if he needs heart monitor and if ok to take Eliquis. Please advise, thank you  Amadou Katzenstein

## 2016-05-22 ENCOUNTER — Other Ambulatory Visit: Payer: Self-pay | Admitting: *Deleted

## 2016-05-22 DIAGNOSIS — I10 Essential (primary) hypertension: Secondary | ICD-10-CM

## 2016-06-07 ENCOUNTER — Encounter: Payer: Self-pay | Admitting: Cardiology

## 2016-06-07 ENCOUNTER — Other Ambulatory Visit: Payer: Self-pay | Admitting: *Deleted

## 2016-06-07 MED ORDER — APIXABAN 2.5 MG PO TABS: 2.5000 mg | ORAL_TABLET | Freq: Two times a day (BID) | ORAL | 0 refills | Status: DC

## 2016-06-20 ENCOUNTER — Encounter: Payer: Self-pay | Admitting: *Deleted

## 2016-06-20 ENCOUNTER — Encounter: Payer: Self-pay | Admitting: Cardiology

## 2016-06-27 ENCOUNTER — Ambulatory Visit (INDEPENDENT_AMBULATORY_CARE_PROVIDER_SITE_OTHER): Payer: Medicare PPO | Admitting: Cardiology

## 2016-06-27 ENCOUNTER — Encounter: Payer: Self-pay | Admitting: Cardiology

## 2016-06-27 VITALS — BP 119/52 | HR 66 | Ht 68.0 in | Wt 184.0 lb

## 2016-06-27 DIAGNOSIS — Z952 Presence of prosthetic heart valve: Secondary | ICD-10-CM

## 2016-06-27 DIAGNOSIS — I480 Paroxysmal atrial fibrillation: Secondary | ICD-10-CM

## 2016-06-27 DIAGNOSIS — I2510 Atherosclerotic heart disease of native coronary artery without angina pectoris: Secondary | ICD-10-CM

## 2016-06-27 DIAGNOSIS — I10 Essential (primary) hypertension: Secondary | ICD-10-CM | POA: Diagnosis not present

## 2016-06-27 DIAGNOSIS — N184 Chronic kidney disease, stage 4 (severe): Secondary | ICD-10-CM

## 2016-06-27 MED ORDER — APIXABAN 2.5 MG PO TABS: 2.5000 mg | ORAL_TABLET | Freq: Two times a day (BID) | ORAL | 0 refills | Status: DC

## 2016-06-27 NOTE — Patient Instructions (Signed)
Medication Instructions:   Eliquis samples provided today.  Continue all other medications.    Labwork: none  Testing/Procedures: none  Follow-Up: 6-8 weeks   Any Other Special Instructions Will Be Listed Below (If Applicable).  If you need a refill on your cardiac medications before your next appointment, please call your pharmacy.

## 2016-06-27 NOTE — Progress Notes (Signed)
Cardiology Office Note  Date: 06/27/2016   ID: ADVAITH LAMARQUE, DOB [birth year], MRN [id number]  PCP: Toma Deiters, MD  Primary Cardiologist: Nona Dell, MD   Chief Complaint  Patient presents with  . Paroxysmal atrial fibrillation  . History of TAVR    History of Present Illness: [name] is an 82 y.o. male last seen in May. Reviewed interval records. He experienced a mechanical fall back in November resulting in bilateral humerus fractures. He did not require surgical intervention. He was initially managed at a facility in [place] and ultimately transferred to [facility]. From a cardiac perspective he did experience paroxysmal atrial fibrillation during this time, also had mild elevation in troponin I. He was seen by Dr. Excell Seltzer on our cardiology team and with CHADSVASC score of 4 was initiated on Eliquis 2.5 mg twice daily.  He is here today with his daughter for a follow-up visit. Has been seeing Dr. Case for orthopedic follow-up in [place]. It sounds like surgery on the right arm is planned in January most likely. He has had a skin infection on that side however.  Mr. Mader has declined over the last several months in general. Hearing is worse, his renal function has also deteriorated.  I reviewed his medications. Current cardiac regimen includes Norvasc, Eliquis at 2.5 mg twice daily, Lasix, hydralazine, Lopressor, and Zocor. He also has as needed nitroglycerin but has not had to use it.  Past Medical History:  Diagnosis Date  . Aortic stenosis   . Arthritis   . CKD (chronic kidney disease) stage 3, GFR 30-59 ml/min   . Coronary atherosclerosis of native coronary artery    Multivessel, LVEF 65%  . Essential hypertension, benign   . Mixed hyperlipidemia   . Myocardial infarction   . PAF (paroxysmal atrial fibrillation) (HCC)    Diagnosed 05/2016  . S/P TAVR (transcatheter aortic valve replacement) 11/17/2013   26 mm Edwards Sapien XT transcatheter  heart valve placed via open left transfemoral approach    Past Surgical History:  Procedure Laterality Date  . APPENDECTOMY    . CATARACT EXTRACTION Left 04/27/13   Dr. Lita Mains  . CORONARY ARTERY BYPASS GRAFT     LIMA to LAD, SVG to OM, SVG to RCA, SVG to diagonal 11/04  . EYE SURGERY    . INTRAOPERATIVE TRANSESOPHAGEAL ECHOCARDIOGRAM N/A 11/17/2013   Procedure: INTRAOPERATIVE TRANSESOPHAGEAL ECHOCARDIOGRAM;  Surgeon: [name], MD;  Location: [facility] OR;  Service: Open Heart Surgery;  Laterality: N/A;  . LEFT AND RIGHT HEART CATHETERIZATION WITH CORONARY/GRAFT ANGIOGRAM N/A 10/13/2013   Procedure: LEFT AND RIGHT HEART CATHETERIZATION WITH Isabel Caprice;  Surgeon: Micheline Chapman, MD;  Location: [facility] CATH LAB;  Service: Cardiovascular;  Laterality: N/A;  . Left total knee arthroplasty  04  . TRANSCATHETER AORTIC VALVE REPLACEMENT, TRANSFEMORAL N/A 11/17/2013   Procedure: TRANSCATHETER AORTIC VALVE REPLACEMENT, TRANSFEMORAL;  Surgeon: [name], MD;  Location: [facility] OR;  Service: Open Heart Surgery;  Laterality: N/A;  . UMBILICAL HERNIA REPAIR      Current Outpatient Prescriptions  Medication Sig Dispense Refill  . acetaminophen (TYLENOL) 325 MG tablet Take 650 mg by mouth every 6 (six) hours as needed for mild pain.     Marland Kitchen amLODipine (NORVASC) 5 MG tablet Take 1 tablet (5 mg total) by mouth daily. 30 tablet 0  . apixaban (ELIQUIS) 2.5 MG TABS tablet Take 1 tablet (2.5 mg total) by mouth 2 (two) times daily. 42 tablet 0  . Calcium Carbonate-Vit  D-Min (CALCIUM 1200 PO) Take 1 capsule by mouth daily.    . cetirizine (ZYRTEC) 10 MG tablet Take 10 mg by mouth daily as needed for allergies.    . clindamycin (CLEOCIN) 300 MG capsule Take 1 capsule by mouth 3 (three) times daily.    . ferrous gluconate (FERGON) 324 MG tablet Take 324 mg by mouth 3 (three) times daily with meals.    . furosemide (LASIX) 20 MG tablet Take 2 tablets (40 mg total) by mouth 2 (two) times daily. 60 tablet 2    . hydrALAZINE (APRESOLINE) 50 MG tablet Take 1 tablet (50 mg total) by mouth 3 (three) times daily. 90 tablet 1  . metoprolol tartrate (LOPRESSOR) 25 MG tablet Take 1 tablet (25 mg total) by mouth 2 (two) times daily. 60 tablet 0  . nitroGLYCERIN (NITROSTAT) 0.4 MG SL tablet Place 1 tablet (0.4 mg total) under the tongue every 5 (five) minutes as needed. 25 tablet 3  . Omega-3 Fatty Acids (FISH OIL) 1200 MG CAPS Take 1 capsule by mouth daily.     Marland Kitchen. oxyCODONE-acetaminophen (PERCOCET/ROXICET) 5-325 MG tablet Take 1 tablet by mouth 3 (three) times daily. 30 tablet 0  . ranitidine (ZANTAC) 150 MG tablet Take 150 mg by mouth daily.    . simvastatin (ZOCOR) 20 MG tablet Take 20 mg by mouth daily.    . sodium bicarbonate 650 MG tablet Take 1,300 mg by mouth 3 (three) times daily.    . tamsulosin (FLOMAX) 0.4 MG CAPS capsule Take 0.4 mg by mouth daily after supper.     No current facility-administered medications for this visit.    Allergies:  Lipitor [atorvastatin calcium] and Morphine and related   Social History: The patient  reports that he has never smoked. He has never used smokeless tobacco. He reports that he does not drink alcohol or use drugs.   ROS:  Please see the history of present illness. Otherwise, complete review of systems is positive for further hearing loss.  All other systems are reviewed and negative.   Physical Exam: VS:  BP (!) 119/52   Pulse 66   Ht 5\' 8"  (1.727 m)   Wt 184 lb (83.5 kg)   BMI 27.98 kg/m , BMI Body mass index is 27.98 kg/m.  Wt Readings from Last 3 Encounters:  06/27/16 184 lb (83.5 kg)  05/06/16 199 lb 1.2 oz (90.3 kg)  11/15/15 181 lb (82.1 kg)    Chronically ill-appearing elderly male HEENT: Conjunctiva and lids normal, oropharynx clear.  Neck: Supple, no elevated jugular venous pressure. No thyromegaly.  Lungs: Clear without labored breathing.  Cardiac: Regular rate and rhythm with 2/6 systolic murmur at the base, 1/6 diastolic murmur as  well. Split S2. No S3 gallop.  Abdomen: Soft, nontender, no bruits. Bowel sounds present.  Skin: Warm and dry.  Extremities: Right arm in a sling with brace and Ace wrap.. Distal pulses 2+. Musculoskeletal: No kyphosis.  Neuropsychiatric: Alert and oriented 3, somewhat flat affect, significantly decreased hearing.  ECG: I personally reviewed the tracing from 05/07/2016 which showed rate-controlled atrial fibrillation with nonspecific ST changes.  Recent Labwork: 05/07/2016: ALT 19; AST 20; Magnesium 2.2 05/08/2016: BUN 50; Creatinine, Ser 2.64; Hemoglobin 7.9; Platelets 218; Potassium 4.9; Sodium 138   Other Studies Reviewed Today:  Echocardiogram 10/20/2015: Study Conclusions  - Left ventricle: The cavity size was normal. Wall thickness was   increased in a pattern of moderate LVH. Systolic function was   normal. The estimated ejection fraction was  in the range of 60%   to 65%. Wall motion was normal; there were no regional wall   motion abnormalities. Features are consistent with a pseudonormal   left ventricular filling pattern, with concomitant abnormal   relaxation and increased filling pressure (grade 2 diastolic   dysfunction). - Aortic valve: S/P TAVR with 26 mm Edwards Sapien XT AVR. There   was mild regurgitation. - Mitral valve: Calcified annulus. Mildly thickened leaflets .   There was mild regurgitation. - Left atrium: The atrium was moderately dilated. - Tricuspid valve: There was mild regurgitation. - Pulmonic valve: There was mild regurgitation.  Assessment and Plan:  1. Paroxysmal atrial fibrillation, likely in sinus rhythm today with regular rate and rhythm. Plan to continue low-dose Eliquis and Lopressor. CHADSVASC score is 4.  2. Severe aortic stenosis status post TAVR in May 2015. Stable by echocardiogram earlier this year.  3. Multivessel CAD status post CABG. No active angina symptoms at this time. Plan is to continue medical therapy and observation  LVEF 60-65%.  4. Essential hypertension, blood pressure is well controlled today.  5. Progressive renal insufficiency, CKD stage 4.  6. History of mechanical fall and bilateral humerus fractures, likely for surgical repair on the right by Dr. Case in January.  Current medicines were reviewed with the patient today.  Disposition: Follow-up in 6-8 weeks.  Signed, Jonelle SidleSamuel G. McDowell, MD, [facility] 06/27/2016 2:55 PM    [facility] [address], [place], [place] [address] Phone: [phone]; Fax: [phone]

## 2016-07-31 ENCOUNTER — Encounter: Payer: Self-pay | Admitting: Cardiology

## 2016-08-06 NOTE — Progress Notes (Signed)
Cardiology Office Note  Date: 08/08/2016   ID: [name], DOB [birth year], MRN [id number]  PCP: Toma DeitersXAJE A HASANAJ, MD  Primary Cardiologist: Nona DellSamuel Skyy Nilan, MD   Chief Complaint  Patient presents with  . PAF  . History of TAVR    History of Present Illness: [name] is an 83 y.o. male last seen in December 2017. He is here today with his daughter for a follow-up visit. He did [facility], is in rehabilitation now. Slowly gaining range of motion with improved swelling in his right hand. Fortunately, he has been relatively stable from a cardiac perspective. No palpitations or angina.  We went over his medications which are outlined below. He does plan to stop Flomax. Current cardiac regimen includes Eliquis, Norvasc, hydralazine, Lopressor, Zocor, and Lasix.  We are requesting [place] [facility] [place] [place] [facility] [place]. Reports stable urine output. No leg edema. He has been using compression hose mainly for DVT prophylaxis after his surgery, but is more mobile now and is already on a an anticoagulant.  Past Medical History:  Diagnosis Date  . Aortic stenosis   . Arthritis   . CKD (chronic kidney disease) stage 3, GFR 30-59 ml/min   . Coronary atherosclerosis of native coronary artery    Multivessel, LVEF 65%  . Essential hypertension, benign   . Mixed hyperlipidemia   . Myocardial infarction   . PAF (paroxysmal atrial fibrillation) (HCC)    Diagnosed 05/2016  . 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 APPENDECTOMY    . CATARACT EXTRACTION Left 04/27/13   Dr. Lita MainsHaines  . CORONARY ARTERY BYPASS GRAFT     LIMA to LAD, SVG to OM, SVG to RCA, SVG to diagonal 11/04  . EYE SURGERY    . INTRAOPERATIVE TRANSESOPHAGEAL ECHOCARDIOGRAM N/A 11/17/2013   Procedure: INTRAOPERATIVE TRANSESOPHAGEAL ECHOCARDIOGRAM;  Surgeon: Tonny BollmanMichael Cooper, MD;  [facility];  Service: Open Heart Surgery;  Laterality: N/A;  . LEFT AND RIGHT HEART CATHETERIZATION WITH CORONARY/GRAFT ANGIOGRAM N/A 10/13/2013   Procedure: LEFT AND RIGHT HEART CATHETERIZATION WITH Isabel CapriceORONARY/GRAFT ANGIOGRAM;  Surgeon: Micheline ChapmanMichael D Cooper, MD;  [facility];  Service: Cardiovascular;  Laterality: N/A;  . Left total knee arthroplasty  04  . TRANSCATHETER AORTIC VALVE REPLACEMENT, TRANSFEMORAL N/A 11/17/2013   Procedure: TRANSCATHETER AORTIC VALVE REPLACEMENT, TRANSFEMORAL;  Surgeon: Tonny BollmanMichael Cooper, MD;  [facility];  Service: Open Heart Surgery;  Laterality: N/A;  . UMBILICAL HERNIA REPAIR      Current Outpatient Prescriptions  Medication Sig Dispense Refill  . acetaminophen (TYLENOL) 325 MG tablet Take 650 mg by mouth every 6 (six) hours as needed for mild pain.     Marland Kitchen. amLODipine (NORVASC) 5 MG tablet Take 1 tablet (5 mg total) by mouth daily. 30 tablet 0  . apixaban (ELIQUIS) 2.5 MG TABS tablet Take 1 tablet (2.5 mg total) by mouth 2 (two) times daily. 42 tablet 0  . Calcium Carbonate-Vit D-Min (CALCIUM 1200 PO) Take 1 capsule by mouth daily.    . cetirizine (ZYRTEC) 10 MG tablet Take 10 mg by mouth daily as needed for allergies.    . ferrous gluconate (FERGON) 324 MG tablet Take 324 mg by mouth 3 (three) times daily with meals.    . furosemide (LASIX) 20 MG tablet Take 2  tablets (40 mg total) by mouth 2 (two) times daily. 60 tablet 2  . hydrALAZINE (APRESOLINE) 50 MG tablet Take 1 tablet (50 mg total) by mouth 3 (three) times daily. 90 tablet 1  . metoprolol tartrate (LOPRESSOR) 25 MG tablet Take 1 tablet (25 mg total) by mouth 2 (two) times daily. 60 tablet 0  . nitroGLYCERIN (NITROSTAT) 0.4 MG SL tablet Place 1 tablet (0.4 mg total) under the tongue every 5 (five) minutes as needed. 25 tablet 3  . Omega-3 Fatty Acids (FISH OIL) 1200 MG CAPS Take 1 capsule by mouth daily.       Marland Kitchen oxyCODONE-acetaminophen (PERCOCET/ROXICET) 5-325 MG tablet Take 1 tablet by mouth 3 (three) times daily. 30 tablet 0  . ranitidine (ZANTAC) 150 MG tablet Take 150 mg by mouth daily.    . simvastatin (ZOCOR) 20 MG tablet Take 20 mg by mouth daily.    . sodium bicarbonate 650 MG tablet Take 1,300 mg by mouth 3 (three) times daily.     No current facility-administered medications for this visit.    Allergies:  Lipitor [atorvastatin calcium] and Morphine and related   Social History: The patient  reports that he has never smoked. He has never used smokeless tobacco. He reports that he does not drink alcohol or use drugs.   ROS:  Please see the history of present illness. Otherwise, complete review of systems is positive for emotional lability per daughter.  All other systems are reviewed and negative.   Physical Exam: VS:  BP (!) 152/68   Pulse 64   Ht 5\' 8"  (1.727 m)   Wt 186 lb (84.4 kg)   SpO2 99%   BMI 28.28 kg/m , BMI Body mass index is 28.28 kg/m.  Wt Readings from Last 3 Encounters:  08/08/16 186 lb (84.4 kg)  06/27/16 184 lb (83.5 kg)  05/06/16 199 lb 1.2 oz (90.3 kg)    Chronically ill-appearing elderly male HEENT: Conjunctiva and lids normal, oropharynx clear.  Neck: Supple, no elevated jugular venous pressure. No thyromegaly.  Lungs: Clear without labored breathing.  Cardiac: Regular rate and rhythm with 2/6 systolic murmur at the base, 1/6 diastolic murmur as well. Split S2. No S3 gallop.  Abdomen: Soft, nontender, no bruits. Bowel sounds present.  Skin: Warm and dry.  Extremities: No leg edema with compression stockings.. Distal pulses 2+. Musculoskeletal: No kyphosis.  Neuropsychiatric: Alert and oriented 3, somewhat flat affect, significantly decreased hearing.  ECG: I personally reviewed the tracing from 05/07/2016 which showed rate-controlled atrial fibrillation with nonspecific ST changes.  Recent Labwork: 05/07/2016: ALT 19; AST 20; Magnesium  2.2 05/08/2016: BUN 50; Creatinine, Ser 2.64; Hemoglobin 7.9; Platelets 218; Potassium 4.9; Sodium 138   Other Studies Reviewed Today:  Echocardiogram 10/20/2015: Study Conclusions  - Left ventricle: The cavity size was normal. Wall thickness was increased in a pattern of moderate LVH. Systolic function was normal. The estimated ejection fraction was in the range of 60% to 65%. Wall motion was normal; there were no regional wall motion abnormalities. Features are consistent with a pseudonormal left ventricular filling pattern, with concomitant abnormal relaxation and increased filling pressure (grade 2 diastolic dysfunction). - Aortic valve: S/P TAVR with 26 mm Edwards Sapien XT AVR. There was mild regurgitation. - Mitral valve: Calcified annulus. Mildly thickened leaflets . There was mild regurgitation. - Left atrium: The atrium was moderately dilated. - Tricuspid valve: There was mild regurgitation. - Pulmonic valve: There was mild regurgitation.  Assessment and Plan:  1. History of severe  aortic stenosis status post TAVR in May 2015. Echocardiogram from last April is reviewed above showing stable valve function.  2. Paroxysmal atrial fibrillation, holding sinus rhythm. Plan to continue Eliquis with CHADSVASC score of 4. Also on Lopressor.  3. Multivessel CAD status post CABG, no active angina symptoms. Continue statin therapy.  4. CKD, stage 4. Requesting most recent BMET from [place].  Current medicines were reviewed with the patient today.  Disposition: Follow-up in 3 months.  Signed, Jonelle Sidle, MD, [facility] 08/08/2016 9:13 AM    [facility] at [facility] [address] [place], [place], [place] [address] Phone: [phone]; Fax: [phone]

## 2016-08-08 ENCOUNTER — Ambulatory Visit (INDEPENDENT_AMBULATORY_CARE_PROVIDER_SITE_OTHER): Payer: Medicare PPO | Admitting: Cardiology

## 2016-08-08 ENCOUNTER — Encounter: Payer: Self-pay | Admitting: Cardiology

## 2016-08-08 VITALS — BP 152/68 | HR 64 | Ht 68.0 in | Wt 186.0 lb

## 2016-08-08 DIAGNOSIS — I2510 Atherosclerotic heart disease of native coronary artery without angina pectoris: Secondary | ICD-10-CM

## 2016-08-08 DIAGNOSIS — N184 Chronic kidney disease, stage 4 (severe): Secondary | ICD-10-CM | POA: Diagnosis not present

## 2016-08-08 DIAGNOSIS — Z952 Presence of prosthetic heart valve: Secondary | ICD-10-CM | POA: Diagnosis not present

## 2016-08-08 DIAGNOSIS — I480 Paroxysmal atrial fibrillation: Secondary | ICD-10-CM | POA: Diagnosis not present

## 2016-08-08 NOTE — Patient Instructions (Signed)
Your physician recommends that you schedule a follow-up appointment in:  3 months with Dr. Diona BrownerMcdowell.  Your physician has recommended you make the following change in your medication:  Stop Flomax   Thank you for choosing [facility]!!

## 2016-08-30 ENCOUNTER — Telehealth: Payer: Self-pay | Admitting: *Deleted

## 2016-08-30 ENCOUNTER — Encounter: Payer: Self-pay | Admitting: Cardiology

## 2016-08-30 ENCOUNTER — Other Ambulatory Visit: Payer: Self-pay | Admitting: Cardiology

## 2016-08-30 MED ORDER — APIXABAN 2.5 MG PO TABS
2.5000 mg | ORAL_TABLET | Freq: Two times a day (BID) | ORAL | 6 refills | Status: DC
Start: 2016-08-30 — End: 2017-02-12

## 2016-08-30 MED ORDER — HYDRALAZINE HCL 50 MG PO TABS
50.0000 mg | ORAL_TABLET | Freq: Three times a day (TID) | ORAL | 6 refills | Status: DC
Start: 2016-08-30 — End: 2017-02-12

## 2016-08-30 MED ORDER — HYDRALAZINE HCL 50 MG PO TABS: 50.0000 mg | ORAL_TABLET | Freq: Three times a day (TID) | ORAL | 6 refills | Status: DC

## 2016-08-30 NOTE — Telephone Encounter (Signed)
[place] patient,will forward  ----- Message -----  From: Lessie DingsJames H Buck  Sent: 08/30/2016 11:40 AM  To: [facility] Triage  Subject: Non-Urgent Medical Question             Need refill of hydralazine 50 mg, please.    Please use [facility] in [place].      Also, do you have any Eliquis samples?   Co-pay is $280  If no samples, need refill please

## 2016-08-30 NOTE — Telephone Encounter (Signed)
Call placed to patient.  Spoke with daughter Elita Quick(Pam).  Informed her that we currently do not have 2.5mg  Eliquis available for samples.  Can send new rx to pharm & let them run through to see if this amount is correct.  Explained to her that sometimes scripts may need a prior authorization or that dollar amount may be his deductible for his prescriptions.  If this is the case, he will have to meet that amount before any of his meds are covered at a lesser copay amount.  Daughter advised to call back if needing further assistance with his Eliquis.  Hydralazine refill sent to pharm as requested.

## 2016-09-10 ENCOUNTER — Encounter: Payer: Self-pay | Admitting: Cardiology

## 2016-10-15 ENCOUNTER — Encounter: Payer: Self-pay | Admitting: Cardiology

## 2016-10-15 ENCOUNTER — Telehealth: Payer: Self-pay | Admitting: *Deleted

## 2016-10-15 NOTE — Telephone Encounter (Signed)
Patient email communication forwarded to me this afternoon for review. I think that the best thing to do in light of gradually progressive symptoms as described would be to have him evaluated by a Neurologist. If [name] daughter is agreeable to this, please go ahead and make a consultation with [facility] Neurology.

## 2016-10-15 NOTE — Telephone Encounter (Signed)
Pam (daughter) notified.  She declines neurology referral at this time.  Wants to see Dr. Diona Browner first.  Stated that this may possibly be related to hypoglycemia since he goes to bed very early and may not be eating for 12-13 hours.  Informed her that if she changed mind prior to OV, notify office.  Daughter verbalized understanding.

## 2016-10-15 NOTE — Telephone Encounter (Signed)
-----   Message from Allayne Gitelman Winton to Jonelle Sidle, MD sent at 10/15/2016 11:36 AM -----   I would like to see if there are any tests you may need to perform at our May 8 visit.  Dad may have suffered a TIA.   Changes have been very gradual, but continue.   Speech is slurred in the mornings, very forgetful and fretful.   He is extremely emotional, tearing up.    Speech does improve as he becomes more active in the day.  CT scan was performed in [facility] when he broke arms (did not hit head, but as a precaution).  No signs of stroke at that time.  No drawing in face.  No issue with walking.  hard to determine if any change in ability from arms, due to the multiple breaks and loss of use.   No medications have changed.  Still on Eliquis from Dr. Olena Leatherwood.   I am available if you feel the need to discuss.   Cell is [phone].  Since this has been gradually changing over several weeks, and not related to a specific incident, I do not feel you would consider this an emergency.  Could be related to discontinuing pain meds, possible interaction

## 2016-10-15 NOTE — Telephone Encounter (Signed)
Craig Sidle, MD at 10/15/2016 12:43 PM   Status: Signed    Patient email communication forwarded to me this afternoon for review. I think that the best thing to do in light of gradually progressive symptoms as described would be to have him evaluated by a Neurologist. If [name] daughter is agreeable to this, please go ahead and make a consultation with [facility] Neurology.

## 2016-10-25 ENCOUNTER — Encounter: Payer: Self-pay | Admitting: Cardiology

## 2016-11-05 NOTE — Progress Notes (Signed)
Cardiology Office Note  Date: 11/06/2016   ID: BREVIN MCFADDEN, DOB 1929/01/06, MRN [id number]  PCP: Toma Deiters, MD  Primary Cardiologist: Nona Dell, MD   Chief Complaint  Patient presents with  . PAF  . History of TAVR  . Coronary Artery Disease    History of Present Illness: [name] is an 83 y.o. male last seen in February. He is here today for a follow-up visit. Overall doing reasonably well, he finished PT and OT, has had generally more strength. Reports an interval visit with Dr. Olena Leatherwood and had lab work in March which we will request.  We reviewed his medications which are outlined below. Current cardiac regimen includes renally adjusted Eliquis, Norvasc, Lasix, hydralazine, Lopressor, and Zocor. His blood pressure is well controlled today. Weight is down 5 pounds. He has not had any significant leg edema.  Last echocardiogram was in April 2017 is outlined below. We discussed obtaining an updated study.  Past Medical History:  Diagnosis Date  . Aortic stenosis   . Arthritis   . CKD (chronic kidney disease) stage 3, GFR 30-59 ml/min   . Coronary atherosclerosis of native coronary artery    Multivessel, LVEF 65%  . Essential hypertension, benign   . Mixed hyperlipidemia   . Myocardial infarction (HCC)   . PAF (paroxysmal atrial fibrillation) (HCC)    Diagnosed 05/2016  . 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 APPENDECTOMY    . CATARACT EXTRACTION Left 04/27/13   Dr. Lita Mains  . CORONARY ARTERY BYPASS GRAFT     LIMA to LAD, SVG to OM, SVG to RCA, SVG to diagonal 11/04  . EYE SURGERY    . INTRAOPERATIVE TRANSESOPHAGEAL ECHOCARDIOGRAM N/A 11/17/2013   Procedure: INTRAOPERATIVE TRANSESOPHAGEAL ECHOCARDIOGRAM;  Surgeon: [name], MD;  Location: [facility] OR;  Service: Open Heart  Surgery;  Laterality: N/A;  . LEFT AND RIGHT HEART CATHETERIZATION WITH CORONARY/GRAFT ANGIOGRAM N/A 10/13/2013   Procedure: LEFT AND RIGHT HEART CATHETERIZATION WITH Isabel Caprice;  Surgeon: Micheline Chapman, MD;  Location: [facility] CATH LAB;  Service: Cardiovascular;  Laterality: N/A;  . Left total knee arthroplasty  04  . TRANSCATHETER AORTIC VALVE REPLACEMENT, TRANSFEMORAL N/A 11/17/2013   Procedure: TRANSCATHETER AORTIC VALVE REPLACEMENT, TRANSFEMORAL;  Surgeon: [name], MD;  Location: [facility] OR;  Service: Open Heart Surgery;  Laterality: N/A;  . UMBILICAL HERNIA REPAIR      Current Outpatient Prescriptions  Medication Sig Dispense Refill  . acetaminophen (TYLENOL) 325 MG tablet Take 650 mg by mouth every 6 (six) hours as needed for mild pain.     Marland Kitchen amLODipine (NORVASC) 5 MG tablet Take 1 tablet (5 mg total) by mouth daily. 30 tablet 0  . apixaban (ELIQUIS) 2.5 MG TABS tablet Take 1 tablet (2.5 mg total) by mouth 2 (two) times daily. 60 tablet 6  . Calcium Carbonate-Vit D-Min (CALCIUM 1200 PO) Take 1 capsule by mouth daily.    . cetirizine (ZYRTEC) 10 MG tablet Take 10 mg by mouth daily as needed for allergies.    . ferrous gluconate (FERGON) 324 MG tablet Take 324 mg by mouth 3 (three) times daily with meals.    . furosemide (LASIX) 20 MG tablet Take 1 tablet (20 mg total) by mouth daily.    . hydrALAZINE (APRESOLINE) 50 MG tablet Take 1 tablet (50 mg  total) by mouth 3 (three) times daily. 90 tablet 6  . metoprolol tartrate (LOPRESSOR) 25 MG tablet Take 1 tablet (25 mg total) by mouth 2 (two) times daily. 60 tablet 0  . nitroGLYCERIN (NITROSTAT) 0.4 MG SL tablet Place 1 tablet (0.4 mg total) under the tongue every 5 (five) minutes as needed. 25 tablet 3  . Omega-3 Fatty Acids (FISH OIL) 1200 MG CAPS Take 1 capsule by mouth daily.     . ranitidine (ZANTAC) 150 MG tablet Take 150 mg by mouth daily.    . simvastatin (ZOCOR) 20 MG tablet Take 20 mg by mouth daily.    . sodium  bicarbonate 650 MG tablet Take 1,300 mg by mouth 3 (three) times daily.     No current facility-administered medications for this visit.    Allergies:  Lipitor [atorvastatin calcium] and Morphine and related   Social History: The patient  reports that he has never smoked. He has never used smokeless tobacco. He reports that he does not drink alcohol or use drugs.   ROS:  Please see the history of present illness. Otherwise, complete review of systems is positive for decreased hearing.  All other systems are reviewed and negative.   Physical Exam: VS:  BP 111/68   Pulse (!) 53   Ht 5\' 8"  (1.727 m)   Wt 181 lb (82.1 kg)   SpO2 99% Comment: on room air  BMI 27.52 kg/m , BMI Body mass index is 27.52 kg/m.  Wt Readings from Last 3 Encounters:  11/06/16 181 lb (82.1 kg)  08/08/16 186 lb (84.4 kg)  06/27/16 184 lb (83.5 kg)    Chronically ill-appearing elderly male HEENT: Conjunctiva and lids normal, oropharynx clear.  Neck: Supple, no elevated jugular venous pressure. No thyromegaly.  Lungs: Clear without labored breathing.  Cardiac: Regular rate and rhythm with 2/6 systolic murmur at the base, 1/6 diastolic murmur as well. Split S2. No S3 gallop.  Abdomen: Soft, nontender, no bruits. Bowel sounds present.  Skin: Warm and dry.  Extremities: No leg edema. Distal pulses 2+. Musculoskeletal: No kyphosis.  Neuropsychiatric: Alert and oriented 3, significantly decreased hearing.  ECG: I personally reviewed the tracing from 05/07/2016 which showed rate-controlled atrial fibrillation with nonspecific ST changes.  Recent Labwork: 05/07/2016: ALT 19; AST 20; Magnesium 2.2 05/08/2016: BUN 50; Creatinine, Ser 2.64; Hemoglobin 7.9; Platelets 218; Potassium 4.9; Sodium 138  January 2018: BUN 55, creatinine 3.0, potassium 4.9  Other Studies Reviewed Today:  Echocardiogram 10/20/2015: Study Conclusions  - Left ventricle: The cavity size was normal. Wall thickness was increased in a  pattern of moderate LVH. Systolic function was normal. The estimated ejection fraction was in the range of 60% to 65%. Wall motion was normal; there were no regional wall motion abnormalities. Features are consistent with a pseudonormal left ventricular filling pattern, with concomitant abnormal relaxation and increased filling pressure (grade 2 diastolic dysfunction). - Aortic valve: S/P TAVR with 26 mm Edwards Sapien XT AVR. There was mild regurgitation. - Mitral valve: Calcified annulus. Mildly thickened leaflets . There was mild regurgitation. - Left atrium: The atrium was moderately dilated. - Tricuspid valve: There was mild regurgitation. - Pulmonic valve: There was mild regurgitation.  Assessment and Plan:  1. Severe aortic stenosis status post TAVR in May 2015. He is doing reasonably well at this point. Plan to obtain a follow-up echocardiogram in comparison to study from last April.  2. Paroxysmal atrial fibrillation, heart rate regular today and no reported progression in palpitations. He continues  on renally adjusted Eliquis with CHADSVASC score of 4. Also beta blocker for heart rate control. Requesting interval lab work from Dr. Hasanaj.  3. Multivessel CAD status post CABG. No active angina symptoms on present regimen. Continue statin therapy.  4. CKD stage IV. Creatinine was 3.0 in January. RequestOlena Leatherwooding interval lab work from Dr. Olena LeatherwoodHasanaj and repeat BMET for next visit.  Current medicines were reviewed with the patient today.   Orders Placed This Encounter  Procedures  . Basic metabolic panel  . ECHOCARDIOGRAM COMPLETE    Disposition: Follow-up in 3 months.  Signed, Jonelle SidleSamuel G. Harris Kistler, MD, [facility] 11/06/2016 9:26 AM    [facility] [address], [place], [place] [address] Phone: [phone]; Fax: [phone]

## 2016-11-06 ENCOUNTER — Encounter: Payer: Self-pay | Admitting: Cardiology

## 2016-11-06 ENCOUNTER — Encounter: Payer: Self-pay | Admitting: *Deleted

## 2016-11-06 ENCOUNTER — Ambulatory Visit (INDEPENDENT_AMBULATORY_CARE_PROVIDER_SITE_OTHER): Payer: Medicare PPO | Admitting: Cardiology

## 2016-11-06 VITALS — BP 111/68 | HR 53 | Ht 68.0 in | Wt 181.0 lb

## 2016-11-06 DIAGNOSIS — Z952 Presence of prosthetic heart valve: Secondary | ICD-10-CM | POA: Diagnosis not present

## 2016-11-06 DIAGNOSIS — I359 Nonrheumatic aortic valve disorder, unspecified: Secondary | ICD-10-CM

## 2016-11-06 DIAGNOSIS — I2510 Atherosclerotic heart disease of native coronary artery without angina pectoris: Secondary | ICD-10-CM

## 2016-11-06 DIAGNOSIS — I480 Paroxysmal atrial fibrillation: Secondary | ICD-10-CM | POA: Diagnosis not present

## 2016-11-06 DIAGNOSIS — N184 Chronic kidney disease, stage 4 (severe): Secondary | ICD-10-CM

## 2016-11-06 MED ORDER — FUROSEMIDE 20 MG PO TABS: 20.0000 mg | ORAL_TABLET | Freq: Every day | ORAL | Status: DC

## 2016-11-06 NOTE — Patient Instructions (Signed)
Medication Instructions:  Continue all current medications.  Labwork:  BMET - order given today.  Due just prior to next office visit.  Testing/Procedures:  Your physician has requested that you have an echocardiogram. Echocardiography is a painless test that uses sound waves to create images of your heart. It provides your doctor with information about the size and shape of your heart and how well your heart's chambers and valves are working. This procedure takes approximately one hour. There are no restrictions for this procedure.  Office will contact with results via phone or letter.    Follow-Up: 3 months   Any Other Special Instructions Will Be Listed Below (If Applicable).  If you need a refill on your cardiac medications before your next appointment, please call your pharmacy.

## 2016-11-20 ENCOUNTER — Ambulatory Visit (INDEPENDENT_AMBULATORY_CARE_PROVIDER_SITE_OTHER): Payer: Medicare PPO

## 2016-11-20 ENCOUNTER — Other Ambulatory Visit: Payer: Self-pay

## 2016-11-20 DIAGNOSIS — I359 Nonrheumatic aortic valve disorder, unspecified: Secondary | ICD-10-CM

## 2016-11-20 DIAGNOSIS — Z952 Presence of prosthetic heart valve: Secondary | ICD-10-CM

## 2016-11-21 ENCOUNTER — Telehealth: Payer: Self-pay

## 2016-11-21 NOTE — Telephone Encounter (Signed)
-----   Message from Jonelle SidleSamuel G McDowell, MD sent at 11/20/2016  4:48 PM EDT ----- Results reviewed. Overall stable findings. TAVR function is normal with stable valve gradient and mild regurgitation. LVEF also normal. We will continue with current plan. A copy of this test should be forwarded to Toma DeitersHasanaj, Xaje A, MD.

## 2016-11-21 NOTE — Telephone Encounter (Signed)
Patient notified. Routed to PCP 

## 2016-12-04 ENCOUNTER — Encounter: Payer: Self-pay | Admitting: Cardiology

## 2016-12-04 NOTE — Telephone Encounter (Signed)
Noted. I think [name] is already aware that our cardiology practice does not cover [facility], so it would be good to get discharge records once available. On the other hand, I would mention that if he does not improve clinically or that there are further concerns that he does need active cardiology input in his management, he could easily be transferred to [facility] if Dr. Olena LeatherwoodHasanaj makes contact with our practice through [facility]. He should have a follow-up visit with us after discharge either way.

## 2017-01-24 ENCOUNTER — Encounter: Payer: Self-pay | Admitting: Cardiology

## 2017-02-11 NOTE — Progress Notes (Signed)
Cardiology Office Note  Date: 02/12/2017   ID: [name], DOB Nov 12, 1928, MRN [id number]  PCP: Neale Burly, MD  Primary Cardiologist: Rozann Lesches, MD   Chief Complaint  Patient presents with  . Aortic valve disease  . PAF  . Coronary Artery Disease    History of Present Illness: [name] is an 83 y.o. male last seen in May. He is here today with his daughter-in-law. Records indicate hospitalization at [facility] in June with progressive weakness and limited appetite as well as abdominal pain. He had undergone endoscopy prior to that with findings of a polyp by report. He was diagnosed with reported acute on chronic combined heart failure associated with minor increase in troponin I, markedly elevated BNP, and comorbidities including acute on chronic renal insufficiency with creatinine up to 3.7 and anemia with hemoglobin down to 7.2. He received packed red cell transfusion, also IV diuresis. Additionally found to have blood cultures positive for Enterococcus faecalis, treated with antibiotics per Dr. Sherrie Sport. He did have follow-up echocardiogram detailed below which did not indicate abnormal bioprosthetic AVR function or specific evidence of endocarditis. There was no aortic regurgitation mentioned. 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 His daughter-in-law indicates that this is not being pursued aggressively.  He presents today in no distress. He is using a walker, has limited ambulation and requires assistance at home. Appetite has improved and he is eating well now. Weight has come up from baseline during his illness, but he has had no progressive leg swelling or orthopnea.  We went over his medications. Lasix dose is now at 20 mg twice daily. Blood pressures have been stable per report with systolics generally over 409. Today's was somewhat lower but he was asymptomatic. No reported major change in weight at  home.  Follow-up echocardiogram from May is outlined below as well.  Past Medical History:  Diagnosis Date  . Aortic stenosis   . Arthritis   . CKD (chronic kidney disease) stage 3, GFR 30-59 ml/min   . Coronary atherosclerosis of native coronary artery    Multivessel, LVEF 65%  . Essential hypertension, benign   . Mixed hyperlipidemia   . Myocardial infarction ([place])   . PAF (paroxysmal atrial fibrillation) ([place])    Diagnosed 05/2016  . 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 APPENDECTOMY    . CATARACT EXTRACTION Left 04/27/13   Dr. Iona Hansen  . CORONARY ARTERY BYPASS GRAFT     LIMA to LAD, SVG to OM, SVG to RCA, SVG to diagonal 11/04  . EYE SURGERY    . INTRAOPERATIVE TRANSESOPHAGEAL ECHOCARDIOGRAM N/A 11/17/2013   Procedure: INTRAOPERATIVE TRANSESOPHAGEAL ECHOCARDIOGRAM;  Surgeon: Sherren Mocha, MD;  Location: [facility] OR;  Service: Open Heart Surgery;  Laterality: N/A;  . LEFT AND RIGHT HEART CATHETERIZATION WITH CORONARY/GRAFT ANGIOGRAM N/A 10/13/2013   Procedure: LEFT AND RIGHT HEART CATHETERIZATION WITH Beatrix Fetters;  Surgeon: Blane Ohara, MD;  Location: [facility] CATH LAB;  Service: Cardiovascular;  Laterality: N/A;  . Left total knee arthroplasty  04  . TRANSCATHETER AORTIC VALVE REPLACEMENT, TRANSFEMORAL N/A 11/17/2013   Procedure: TRANSCATHETER AORTIC VALVE REPLACEMENT, TRANSFEMORAL;  Surgeon: Sherren Mocha, MD;  Location: [place];  Service: Open Heart Surgery;  Laterality: N/A;  . UMBILICAL HERNIA REPAIR      Current Outpatient  Prescriptions  Medication Sig Dispense Refill  . acetaminophen (TYLENOL) 325 MG tablet Take 650 mg by mouth every 6 (six) hours as needed for mild pain.     . Calcium Carbonate-Vit D-Min (CALCIUM 1200 PO) Take 1 capsule by mouth daily.    . cetirizine (ZYRTEC) 10 MG tablet  Take 10 mg by mouth daily as needed for allergies.    . ferrous gluconate (FERGON) 324 MG tablet Take 324 mg by mouth 3 (three) times daily with meals.    . furosemide (LASIX) 20 MG tablet Take 20 mg by mouth 2 (two) times daily.    . hydrALAZINE (APRESOLINE) 25 MG tablet Take 25 mg by mouth daily.    Marland Kitchen LORazepam (ATIVAN) 0.5 MG tablet Take 0.5 mg by mouth at bedtime.    . metoprolol tartrate (LOPRESSOR) 25 MG tablet Take 1 tablet (25 mg total) by mouth 2 (two) times daily. 60 tablet 0  . nitroGLYCERIN (NITROSTAT) 0.4 MG SL tablet Place 1 tablet (0.4 mg total) under the tongue every 5 (five) minutes as needed. 25 tablet 3  . Omega-3 Fatty Acids (FISH OIL) 1200 MG CAPS Take 1 capsule by mouth daily.     . ranitidine (ZANTAC) 150 MG tablet Take 150 mg by mouth daily.    . simvastatin (ZOCOR) 20 MG tablet Take 20 mg by mouth daily.    . sodium bicarbonate 650 MG tablet Take 1,300 mg by mouth 3 (three) times daily.    . tamsulosin (FLOMAX) 0.4 MG CAPS capsule Take 0.4 mg by mouth daily.     No current facility-administered medications for this visit.    Allergies:  Lipitor [atorvastatin calcium] and Morphine and related   Social History: The patient  reports that he has never smoked. He has never used smokeless tobacco. He reports that he does not drink alcohol or use drugs.   Family History: The patient's family history includes Cirrhosis in his father.   ROS:  Please see the history of present illness. Otherwise, complete review of systems is positive for unsteadiness on feet, intermittent leg swelling.  All other systems are reviewed and negative.   Physical Exam: VS:  BP (!) 94/40   Pulse 63   Ht _0  (1.727 m)   Wt 172 lb (78 kg)   SpO2 97%   BMI 26.15 kg/m , BMI Body mass index is 26.15 kg/m.  Wt Readings from Last 3 Encounters:  02/12/17 172 lb (78 kg)  11/06/16 181 lb (82.1 kg)  08/08/16 186 lb (84.4 kg)    Chronically ill-appearing elderly maleUsing a walker. HEENT:  Conjunctiva and lids normal, oropharynx clear.  Neck: Supple, no elevated jugular venous pressure. No thyromegaly.  Lungs: Decreased breath sounds at the bases.  Cardiac: Regular rate and rhythm with 2/6 systolic murmur at the base, no diastolic murmur. Split S2. No S3 gallop.  Abdomen: Soft, nontender, no bruits. Bowel sounds present.  Skin: Warm and dry.  Extremities: Mild ankle edema. Distal pulses 1-2+. Musculoskeletal: No kyphosis.  Neuropsychiatric: Alert and oriented 3, significantly decreased hearing.  ECG: I personally reviewed the tracing from 05/07/2016 which showed rate-controlled atrial fibrillation with nonspecific ST changes.  Recent Labwork: 05/07/2016: ALT 19; AST 20; Magnesium 2.2 05/08/2016: BUN 50; Creatinine, Ser 2.64; Hemoglobin 7.9; Platelets 218; Potassium 4.9; Sodium 138  March 2018: Hemoglobin A1c 6.1, hemoglobin 10.8, platelets 162, BUN 54, creatinine 2.8, potassium 5.03 December 2016: BUN 66, creatinine 3.18, potassium 4.1, AST 23, ALT 19, hemoglobin 9.3, platelets 137, peak troponin  T 0.21  Other Studies Reviewed Today:  Echocardiogram 11/20/2016: Study Conclusions  - Left ventricle: The cavity size was normal. Wall thickness was   increased in a pattern of mild LVH. Systolic function was   vigorous. The estimated ejection fraction was in the range of 65%   to 70%. Wall motion was normal; there were no regional wall   motion abnormalities. - Aortic valve: Status post TAVR with 26 mm Edwards Sapien XT.   There was mild regurgitation, possibly a small perivalvular   regurgitant jet. Mean gradient (S): 12 mm Hg. Peak gradient (S):   27 mm Hg. VTI ratio of LVOT to aortic valve: 0.62. - Mitral valve: Mildly to moderately calcified annulus. There was   mild regurgitation. - Left atrium: The atrium was moderately dilated. - Right atrium: The atrium was mildly dilated. Central venous   pressure (est): 3 mm Hg. - Tricuspid valve: There was trivial  regurgitation. - Pulmonary arteries: PA peak pressure: 36 mm Hg (S). - Pericardium, extracardiac: There was no pericardial effusion.  Impressions:  - Mild LVH with LVEF 65-70%. Grade 1 diastolic dysfunction.   Moderate left atrial enlargement. Mild to moderate mitral annular   calcification with mild mitral regurgitation. Status post TAVR   with stable valve function, mild aortic regurgitation some of   which looks to be perivalvular. Mean gradient is stable at 12   mmHg. Trivial tricuspid regurgitation with PASP 36 mmHg.  Echocardiogram 01/01/2017 ([place]): Moderate LVH with LVEF 44-92%, grade 2 diastolic dysfunction, mild left atrial enlargement, normal right ventricular contraction, bioprosthetic AVR with reported normal leaflet motion and no aortic regurgitation, MAC with trace mitral regurgitation, trace tricuspid regurgitation with RVSP 25 mmHg.  Assessment and Plan:  1. History of TAVR in May 2015 for management of severe aortic stenosis. This has been stable, recently assessed by follow-up echocardiogram. No definite valvular vegetations described during hospitalization at [facility] in June with Enterococcus faecalis bacteremia.  2. Paroxysmal atrial fibrillation, in sinus rhythm by review of recent ECG done at [facility]. CHADSVASC score is 4. He has been taken off of Eliquis by PCP due to recurrent GI bleeding.  3. Multivessel CAD status post CABG without active angina symptoms. He remains on beta blocker and statin.  4. CKD stage IV, creatinine down to 3.1 from acute exacerbation to 3.8 per review of lab work from [facility].  5. 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 Per discussion with patient's daughter-in-law, this is being managed conservatively. Palliative care has been discussed.  Current medicines were reviewed with the patient today.   Orders  Placed This Encounter  Procedures  . Basic Metabolic Panel (BMET)    Disposition: Follow-up in 3 months.  Signed, Satira Sark, MD, [facility] 02/12/2017 9:19 AM    [place] [facility] [place], [place], [place] [address] Phone: [phone]; Fax: [phone]

## 2017-02-12 ENCOUNTER — Ambulatory Visit (INDEPENDENT_AMBULATORY_CARE_PROVIDER_SITE_OTHER): Payer: Medicare PPO | Admitting: Cardiology

## 2017-02-12 ENCOUNTER — Encounter: Payer: Self-pay | Admitting: Cardiology

## 2017-02-12 VITALS — BP 94/40 | HR 63 | Ht 68.0 in | Wt 172.0 lb

## 2017-02-12 DIAGNOSIS — I2510 Atherosclerotic heart disease of native coronary artery without angina pectoris: Secondary | ICD-10-CM

## 2017-02-12 DIAGNOSIS — K869 Disease of pancreas, unspecified: Secondary | ICD-10-CM | POA: Diagnosis not present

## 2017-02-12 DIAGNOSIS — Z952 Presence of prosthetic heart valve: Secondary | ICD-10-CM | POA: Diagnosis not present

## 2017-02-12 DIAGNOSIS — N184 Chronic kidney disease, stage 4 (severe): Secondary | ICD-10-CM

## 2017-02-12 DIAGNOSIS — I480 Paroxysmal atrial fibrillation: Secondary | ICD-10-CM

## 2017-02-12 DIAGNOSIS — K8689 Other specified diseases of pancreas: Secondary | ICD-10-CM

## 2017-02-12 NOTE — Patient Instructions (Signed)
Medication Instructions:  Your physician recommends that you continue on your current medications as directed. Please refer to the Current Medication list given to you today.  Labwork: BMET IN 3 MONTHS   Testing/Procedures: NONE  Follow-Up: Your physician recommends that you schedule a follow-up appointment in: 3 MONTHS WITH DR. MCDOWELL  Any Other Special Instructions Will Be Listed Below (If Applicable).  If you need a refill on your cardiac medications before your next appointment, please call your pharmacy. 

## 2017-03-02 DEATH — deceased

## 2017-05-21 ENCOUNTER — Ambulatory Visit: Payer: Medicare PPO | Admitting: Cardiology
